# Patient Record
Sex: Female | Born: 1955 | Race: Black or African American | Hispanic: No | Smoking: Current some day smoker
Health system: Southern US, Community
[De-identification: ages and names within clinical notes are randomized; demographics above are authoritative.]

## PROBLEM LIST (undated history)

## (undated) DIAGNOSIS — T8859XA Other complications of anesthesia, initial encounter: Secondary | ICD-10-CM

## (undated) DIAGNOSIS — I10 Essential (primary) hypertension: Secondary | ICD-10-CM

## (undated) DIAGNOSIS — T4145XA Adverse effect of unspecified anesthetic, initial encounter: Secondary | ICD-10-CM

## (undated) DIAGNOSIS — K219 Gastro-esophageal reflux disease without esophagitis: Secondary | ICD-10-CM

## (undated) DIAGNOSIS — R0602 Shortness of breath: Secondary | ICD-10-CM

## (undated) DIAGNOSIS — M1990 Unspecified osteoarthritis, unspecified site: Secondary | ICD-10-CM

## (undated) DIAGNOSIS — I219 Acute myocardial infarction, unspecified: Secondary | ICD-10-CM

## (undated) DIAGNOSIS — J189 Pneumonia, unspecified organism: Secondary | ICD-10-CM

## (undated) DIAGNOSIS — R011 Cardiac murmur, unspecified: Secondary | ICD-10-CM

## (undated) DIAGNOSIS — I499 Cardiac arrhythmia, unspecified: Secondary | ICD-10-CM

## (undated) HISTORY — PX: REPLACEMENT TOTAL KNEE BILATERAL: SUR1225

## (undated) HISTORY — PX: SHOULDER ARTHROSCOPY W/ ROTATOR CUFF REPAIR: SHX2400

## (undated) HISTORY — PX: ABDOMINAL HYSTERECTOMY: SHX81

## (undated) HISTORY — PX: SKIN GRAFT: SHX250

## (undated) SURGERY — ARTHROPLASTY, HIP, TOTAL,POSTERIOR APPROACH
Anesthesia: General | Laterality: Left

---

## 1998-12-31 ENCOUNTER — Ambulatory Visit (HOSPITAL_COMMUNITY): Admission: RE | Admit: 1998-12-31 | Discharge: 1998-12-31 | Payer: Self-pay | Admitting: *Deleted

## 1999-02-24 ENCOUNTER — Inpatient Hospital Stay (HOSPITAL_COMMUNITY): Admission: EM | Admit: 1999-02-24 | Discharge: 1999-02-25 | Payer: Self-pay | Admitting: Emergency Medicine

## 1999-02-24 ENCOUNTER — Encounter: Payer: Self-pay | Admitting: Emergency Medicine

## 1999-08-23 ENCOUNTER — Emergency Department (HOSPITAL_COMMUNITY): Admission: EM | Admit: 1999-08-23 | Discharge: 1999-08-23 | Payer: Self-pay | Admitting: Emergency Medicine

## 1999-08-23 ENCOUNTER — Encounter: Payer: Self-pay | Admitting: Emergency Medicine

## 2000-02-25 ENCOUNTER — Emergency Department (HOSPITAL_COMMUNITY): Admission: EM | Admit: 2000-02-25 | Discharge: 2000-02-25 | Payer: Self-pay | Admitting: Emergency Medicine

## 2001-03-16 ENCOUNTER — Emergency Department (HOSPITAL_COMMUNITY): Admission: EM | Admit: 2001-03-16 | Discharge: 2001-03-16 | Payer: Self-pay

## 2001-10-17 ENCOUNTER — Emergency Department (HOSPITAL_COMMUNITY): Admission: EM | Admit: 2001-10-17 | Discharge: 2001-10-17 | Payer: Self-pay | Admitting: Emergency Medicine

## 2002-12-14 ENCOUNTER — Encounter: Payer: Self-pay | Admitting: Emergency Medicine

## 2002-12-14 ENCOUNTER — Emergency Department (HOSPITAL_COMMUNITY): Admission: EM | Admit: 2002-12-14 | Discharge: 2002-12-14 | Payer: Self-pay | Admitting: Emergency Medicine

## 2002-12-27 ENCOUNTER — Encounter: Payer: Self-pay | Admitting: Family Medicine

## 2002-12-27 ENCOUNTER — Encounter: Admission: RE | Admit: 2002-12-27 | Discharge: 2002-12-27 | Payer: Self-pay | Admitting: Family Medicine

## 2003-01-23 ENCOUNTER — Encounter: Admission: RE | Admit: 2003-01-23 | Discharge: 2003-04-23 | Payer: Self-pay | Admitting: Orthopedic Surgery

## 2003-02-25 ENCOUNTER — Ambulatory Visit (HOSPITAL_COMMUNITY): Admission: RE | Admit: 2003-02-25 | Discharge: 2003-02-25 | Payer: Self-pay | Admitting: Orthopedic Surgery

## 2003-02-25 ENCOUNTER — Encounter: Payer: Self-pay | Admitting: Orthopedic Surgery

## 2003-04-08 ENCOUNTER — Encounter: Payer: Self-pay | Admitting: Orthopedic Surgery

## 2003-04-09 ENCOUNTER — Ambulatory Visit (HOSPITAL_COMMUNITY): Admission: RE | Admit: 2003-04-09 | Discharge: 2003-04-10 | Payer: Self-pay | Admitting: Orthopedic Surgery

## 2003-04-30 ENCOUNTER — Encounter: Admission: RE | Admit: 2003-04-30 | Discharge: 2003-06-05 | Payer: Self-pay | Admitting: Orthopedic Surgery

## 2003-09-04 ENCOUNTER — Inpatient Hospital Stay (HOSPITAL_COMMUNITY): Admission: EM | Admit: 2003-09-04 | Discharge: 2003-09-11 | Payer: Self-pay | Admitting: Emergency Medicine

## 2003-09-17 ENCOUNTER — Encounter: Admission: RE | Admit: 2003-09-17 | Discharge: 2003-09-17 | Payer: Self-pay | Admitting: Family Medicine

## 2004-11-11 ENCOUNTER — Encounter (INDEPENDENT_AMBULATORY_CARE_PROVIDER_SITE_OTHER): Payer: Self-pay | Admitting: Specialist

## 2004-11-11 ENCOUNTER — Ambulatory Visit (HOSPITAL_COMMUNITY): Admission: RE | Admit: 2004-11-11 | Discharge: 2004-11-11 | Payer: Self-pay | Admitting: Orthopedic Surgery

## 2004-11-28 IMAGING — CR DG CHEST 2V
2 series · 2 of 2 positions shown · non-contrast
Comparison: none

CLINICAL DATA: Pneumonia.  
 TWO VIEW CHEST RADIOGRAPH   
 Comparing:   09/06/03.

[view not recorded (1 of 2)]
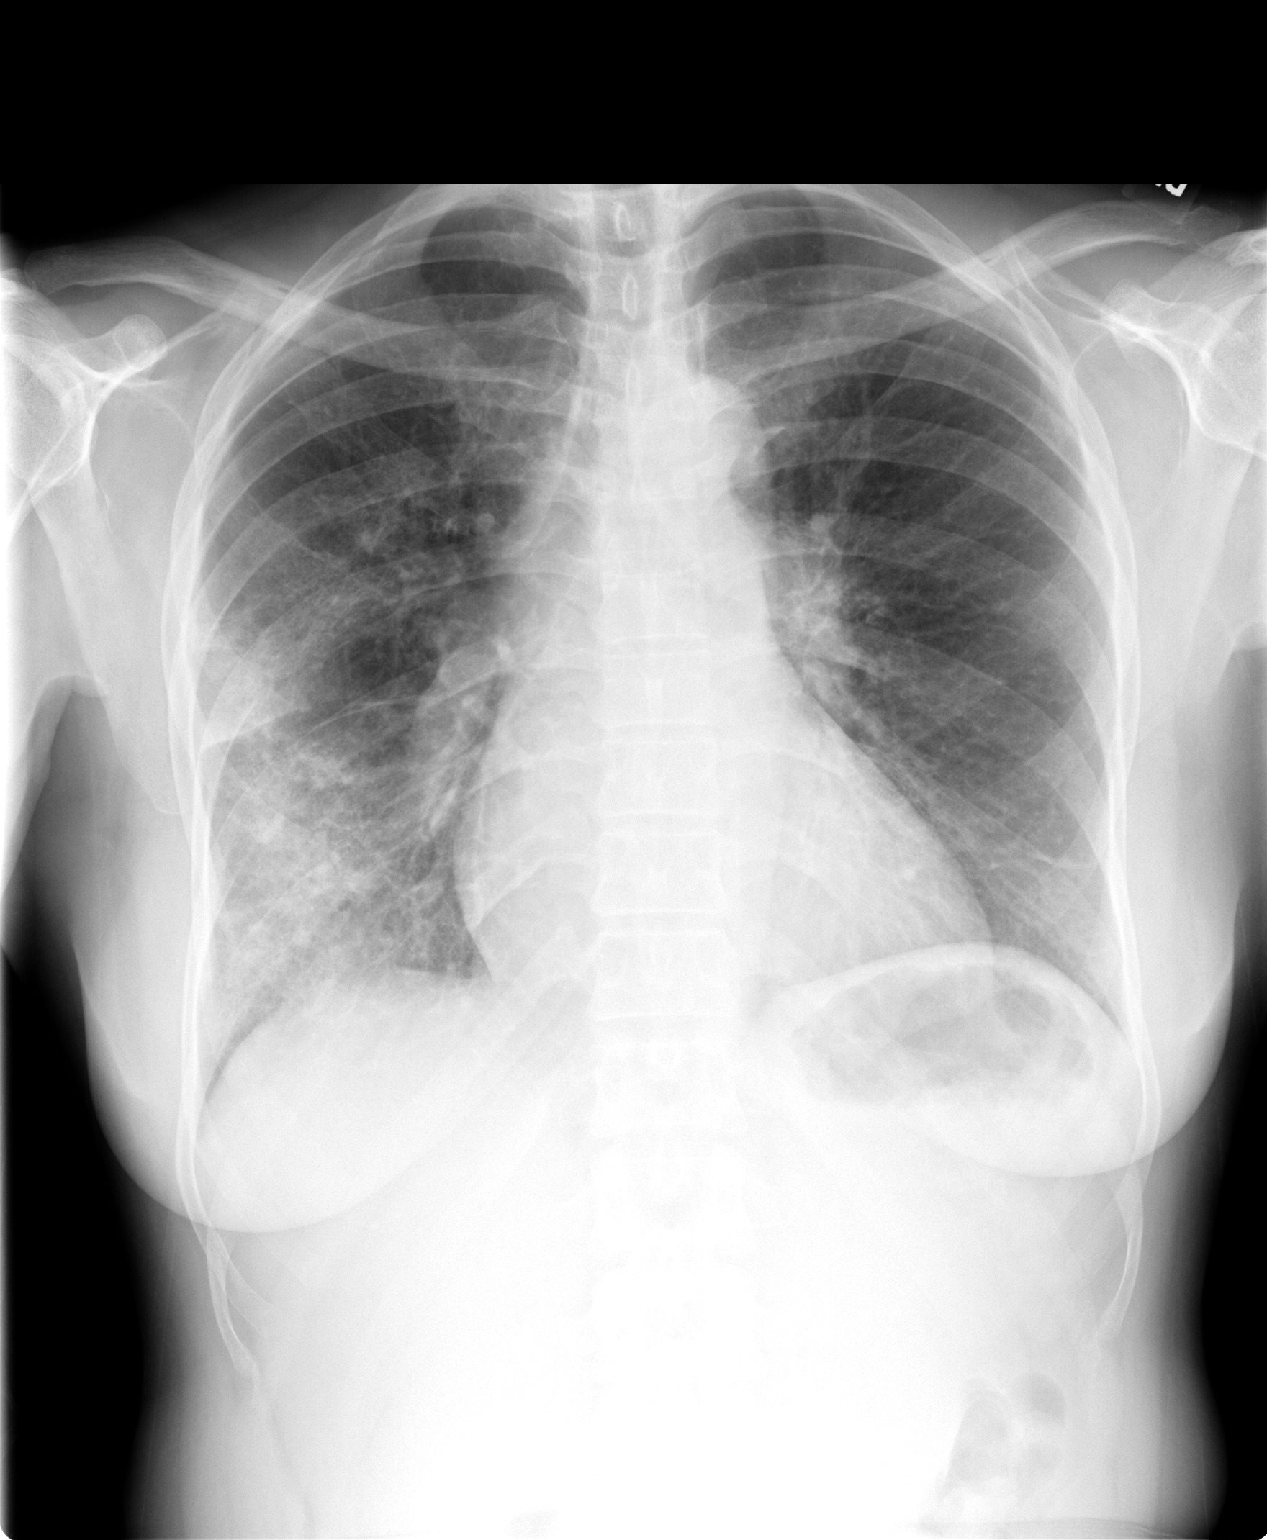

[view not recorded (2 of 2)]
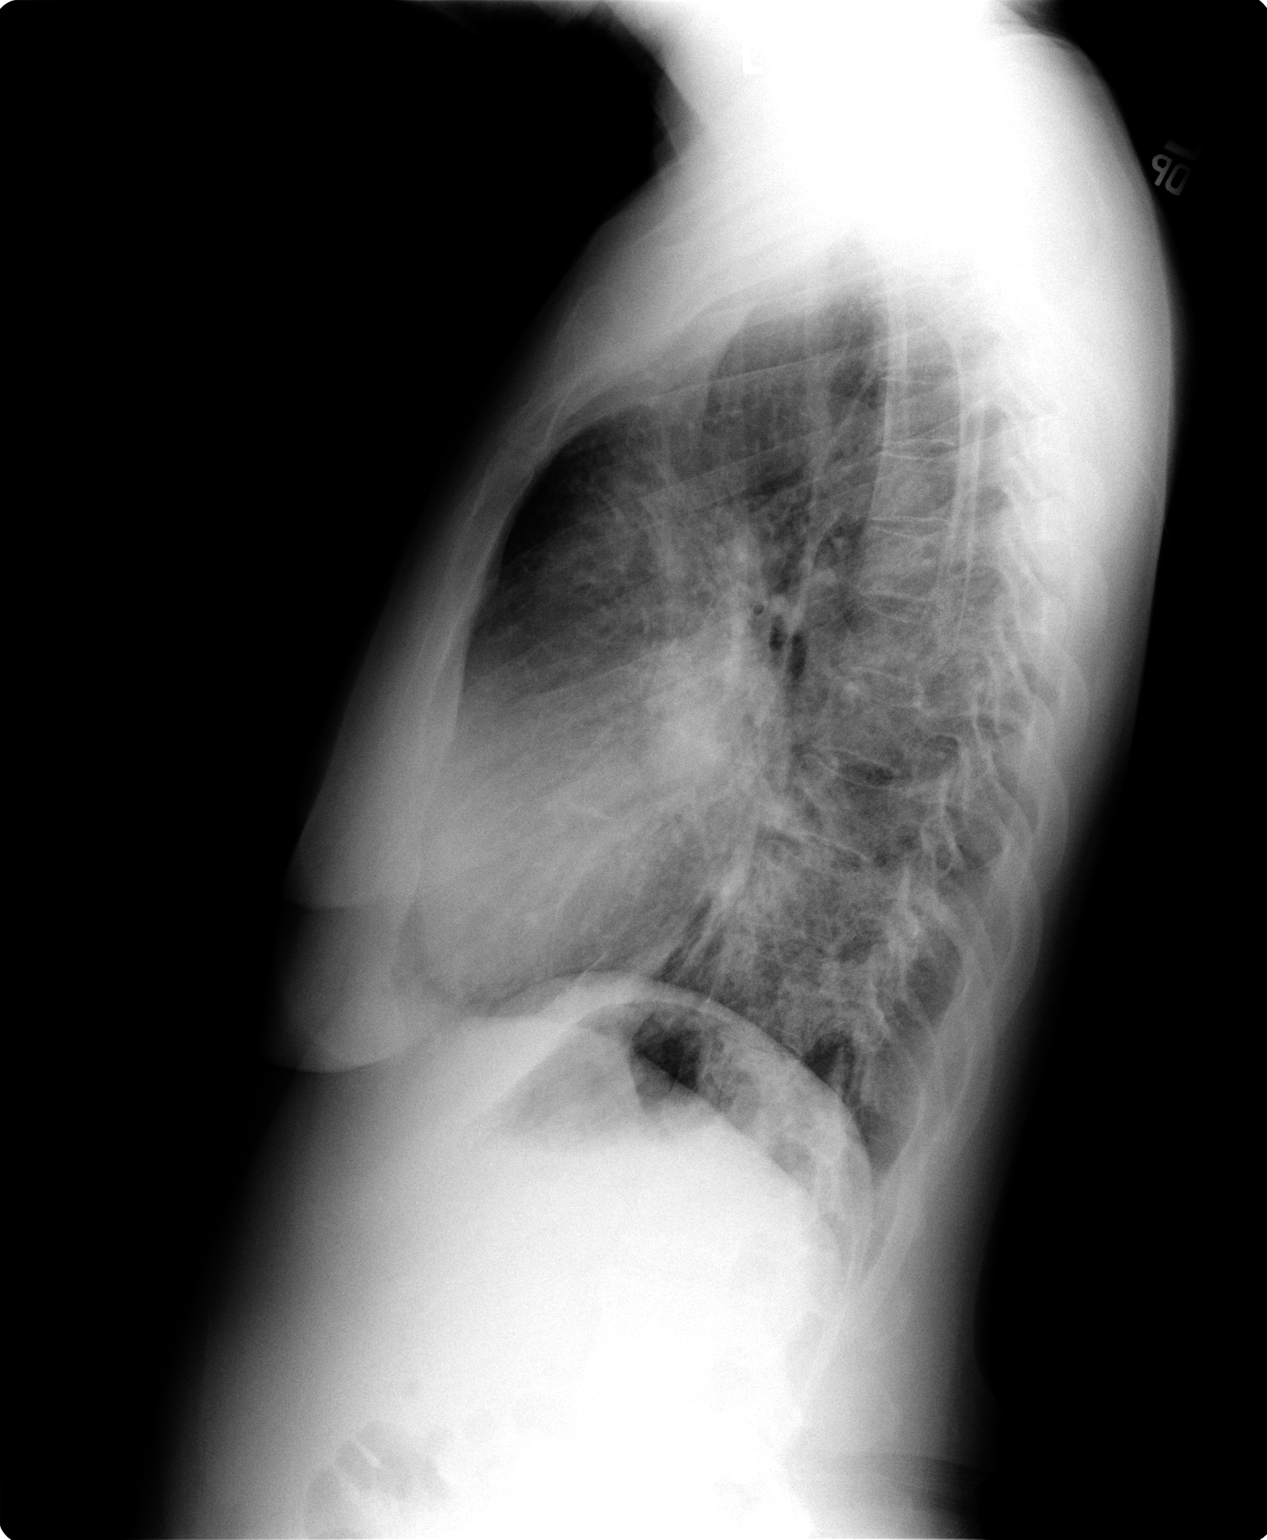

[2 of 2 positions shown; findings below may reference images not displayed]

FINDINGS: There is improved air space opacity bilaterally.  The appearance is less confluent in the right lung and there is reduced interstitial prominence in the left lung.  There is cardiomegaly and so this appearance could be due to asymmetric edema or pneumonia. 
 IMPRESSION
 1.  Improving interstitial and air space opacities.

## 2004-12-22 ENCOUNTER — Encounter: Admission: RE | Admit: 2004-12-22 | Discharge: 2005-03-02 | Payer: Self-pay | Admitting: Orthopedic Surgery

## 2005-03-02 ENCOUNTER — Encounter: Admission: RE | Admit: 2005-03-02 | Discharge: 2005-04-15 | Payer: Self-pay | Admitting: Orthopedic Surgery

## 2006-07-07 ENCOUNTER — Encounter: Admission: RE | Admit: 2006-07-07 | Discharge: 2006-07-07 | Payer: Self-pay | Admitting: Orthopedic Surgery

## 2006-07-20 ENCOUNTER — Encounter: Admission: RE | Admit: 2006-07-20 | Discharge: 2006-07-20 | Payer: Self-pay | Admitting: Orthopedic Surgery

## 2006-10-18 ENCOUNTER — Other Ambulatory Visit: Admission: RE | Admit: 2006-10-18 | Discharge: 2006-10-18 | Payer: Self-pay | Admitting: Family Medicine

## 2006-10-18 ENCOUNTER — Encounter: Admission: RE | Admit: 2006-10-18 | Discharge: 2006-10-18 | Payer: Self-pay | Admitting: Orthopedic Surgery

## 2006-10-25 ENCOUNTER — Encounter: Admission: RE | Admit: 2006-10-25 | Discharge: 2006-10-25 | Payer: Self-pay | Admitting: Family Medicine

## 2006-11-21 ENCOUNTER — Encounter: Admission: RE | Admit: 2006-11-21 | Discharge: 2006-11-21 | Payer: Self-pay | Admitting: Orthopedic Surgery

## 2007-01-09 ENCOUNTER — Encounter: Admission: RE | Admit: 2007-01-09 | Discharge: 2007-01-09 | Payer: Self-pay | Admitting: Orthopedic Surgery

## 2007-07-28 ENCOUNTER — Emergency Department (HOSPITAL_COMMUNITY): Admission: EM | Admit: 2007-07-28 | Discharge: 2007-07-28 | Payer: Self-pay | Admitting: Emergency Medicine

## 2007-08-31 ENCOUNTER — Inpatient Hospital Stay (HOSPITAL_COMMUNITY): Admission: RE | Admit: 2007-08-31 | Discharge: 2007-09-04 | Payer: Self-pay | Admitting: Orthopedic Surgery

## 2007-09-26 ENCOUNTER — Encounter: Admission: RE | Admit: 2007-09-26 | Discharge: 2007-12-25 | Payer: Self-pay | Admitting: Orthopedic Surgery

## 2008-06-13 ENCOUNTER — Encounter: Admission: RE | Admit: 2008-06-13 | Discharge: 2008-06-13 | Payer: Self-pay | Admitting: Orthopedic Surgery

## 2008-07-05 HISTORY — PX: TOTAL HIP ARTHROPLASTY: SHX124

## 2008-10-24 ENCOUNTER — Encounter: Admission: RE | Admit: 2008-10-24 | Discharge: 2008-10-24 | Payer: Self-pay | Admitting: Orthopedic Surgery

## 2009-02-27 ENCOUNTER — Encounter: Admission: RE | Admit: 2009-02-27 | Discharge: 2009-02-27 | Payer: Self-pay | Admitting: Anesthesiology

## 2009-04-24 ENCOUNTER — Encounter: Admission: RE | Admit: 2009-04-24 | Discharge: 2009-04-24 | Payer: Self-pay | Admitting: Orthopedic Surgery

## 2009-04-29 ENCOUNTER — Inpatient Hospital Stay (HOSPITAL_COMMUNITY): Admission: RE | Admit: 2009-04-29 | Discharge: 2009-05-01 | Payer: Self-pay | Admitting: Orthopedic Surgery

## 2009-06-05 ENCOUNTER — Encounter: Admission: RE | Admit: 2009-06-05 | Discharge: 2009-07-03 | Payer: Self-pay | Admitting: Orthopedic Surgery

## 2009-07-05 ENCOUNTER — Encounter: Admission: RE | Admit: 2009-07-05 | Discharge: 2009-08-21 | Payer: Self-pay | Admitting: Orthopedic Surgery

## 2009-08-06 ENCOUNTER — Emergency Department (HOSPITAL_COMMUNITY): Admission: EM | Admit: 2009-08-06 | Discharge: 2009-08-06 | Payer: Self-pay | Admitting: Emergency Medicine

## 2009-10-22 ENCOUNTER — Encounter: Admission: RE | Admit: 2009-10-22 | Discharge: 2009-10-22 | Payer: Self-pay | Admitting: Orthopedic Surgery

## 2010-01-13 ENCOUNTER — Encounter: Admission: RE | Admit: 2010-01-13 | Discharge: 2010-01-13 | Payer: Self-pay | Admitting: Orthopedic Surgery

## 2010-05-18 ENCOUNTER — Encounter: Admission: RE | Admit: 2010-05-18 | Discharge: 2010-05-18 | Payer: Self-pay | Admitting: Anesthesiology

## 2010-09-25 LAB — DIFFERENTIAL
Basophils Absolute: 0 10*3/uL (ref 0.0–0.1)
Eosinophils Absolute: 0.2 10*3/uL (ref 0.0–0.7)
Eosinophils Relative: 2 % (ref 0–5)

## 2010-09-25 LAB — COMPREHENSIVE METABOLIC PANEL
ALT: 13 U/L (ref 0–35)
AST: 21 U/L (ref 0–37)
Alkaline Phosphatase: 87 U/L (ref 39–117)
CO2: 23 mEq/L (ref 19–32)
Chloride: 107 mEq/L (ref 96–112)
GFR calc Af Amer: >60 mL/min (ref >60)
GFR calc non Af Amer: >60 mL/min (ref >60)
Potassium: 4.3 mEq/L (ref 3.5–5.1)
Sodium: 139 mEq/L (ref 135–145)
Total Bilirubin: 0.7 mg/dL (ref 0.3–1.2)

## 2010-09-25 LAB — CBC
MCV: 91.4 fL (ref 78.0–100.0)
RBC: 4.89 MIL/uL (ref 3.87–5.11)
WBC: 10.5 10*3/uL (ref 4.0–10.5)

## 2010-09-25 LAB — DIGOXIN LEVEL: Digoxin Level: <0.2 ng/mL — ABNORMAL LOW (ref 0.8–2.0)

## 2010-10-01 ENCOUNTER — Emergency Department (HOSPITAL_COMMUNITY)
Admission: EM | Admit: 2010-10-01 | Discharge: 2010-10-01 | Disposition: A | Discharge status: Home / Self Care | Payer: Medicaid Other | Attending: Emergency Medicine | Admitting: Emergency Medicine

## 2010-10-01 ENCOUNTER — Emergency Department (HOSPITAL_COMMUNITY): Payer: Medicaid Other

## 2010-10-01 ENCOUNTER — Encounter (HOSPITAL_COMMUNITY): Payer: Self-pay | Admitting: Radiology

## 2010-10-01 DIAGNOSIS — R10816 Epigastric abdominal tenderness: Secondary | ICD-10-CM | POA: Insufficient documentation

## 2010-10-01 DIAGNOSIS — Z79899 Other long term (current) drug therapy: Secondary | ICD-10-CM | POA: Insufficient documentation

## 2010-10-01 DIAGNOSIS — Z86711 Personal history of pulmonary embolism: Secondary | ICD-10-CM | POA: Insufficient documentation

## 2010-10-01 DIAGNOSIS — R059 Cough, unspecified: Secondary | ICD-10-CM | POA: Insufficient documentation

## 2010-10-01 DIAGNOSIS — I10 Essential (primary) hypertension: Secondary | ICD-10-CM | POA: Insufficient documentation

## 2010-10-01 DIAGNOSIS — M129 Arthropathy, unspecified: Secondary | ICD-10-CM | POA: Insufficient documentation

## 2010-10-01 DIAGNOSIS — D72829 Elevated white blood cell count, unspecified: Secondary | ICD-10-CM | POA: Insufficient documentation

## 2010-10-01 DIAGNOSIS — R1310 Dysphagia, unspecified: Secondary | ICD-10-CM | POA: Insufficient documentation

## 2010-10-01 DIAGNOSIS — R05 Cough: Secondary | ICD-10-CM | POA: Insufficient documentation

## 2010-10-01 DIAGNOSIS — R1013 Epigastric pain: Secondary | ICD-10-CM | POA: Insufficient documentation

## 2010-10-01 DIAGNOSIS — R079 Chest pain, unspecified: Secondary | ICD-10-CM | POA: Insufficient documentation

## 2010-10-01 HISTORY — DX: Essential (primary) hypertension: I10

## 2010-10-01 LAB — POCT CARDIAC MARKERS: Troponin i, poc: <0.05 ng/mL (ref 0.00–0.09)

## 2010-10-01 LAB — URINALYSIS, ROUTINE W REFLEX MICROSCOPIC
Bilirubin Urine: NEGATIVE
Glucose, UA: NEGATIVE mg/dL
Hgb urine dipstick: NEGATIVE
Ketones, ur: NEGATIVE mg/dL
Protein, ur: NEGATIVE mg/dL
Urobilinogen, UA: 1 mg/dL (ref 0.0–1.0)

## 2010-10-01 LAB — COMPREHENSIVE METABOLIC PANEL
ALT: 21 U/L (ref 0–35)
AST: 14 U/L (ref 0–37)
Albumin: 3.3 g/dL — ABNORMAL LOW (ref 3.5–5.2)
Alkaline Phosphatase: 69 U/L (ref 39–117)
BUN: 20 mg/dL (ref 6–23)
Chloride: 106 mEq/L (ref 96–112)
Potassium: 3.2 mEq/L — ABNORMAL LOW (ref 3.5–5.1)
Sodium: 140 mEq/L (ref 135–145)
Total Bilirubin: 0.6 mg/dL (ref 0.3–1.2)
Total Protein: 6.5 g/dL (ref 6.0–8.3)

## 2010-10-01 LAB — D-DIMER, QUANTITATIVE: D-Dimer, Quant: <0.22 ug/mL-FEU (ref 0.00–0.48)

## 2010-10-01 LAB — DIFFERENTIAL
Basophils Relative: 0 % (ref 0–1)
Lymphs Abs: 3.5 10*3/uL (ref 0.7–4.0)
Monocytes Relative: 6 % (ref 3–12)
Neutro Abs: 18.5 10*3/uL — ABNORMAL HIGH (ref 1.7–7.7)
Neutrophils Relative %: 79 % — ABNORMAL HIGH (ref 43–77)

## 2010-10-01 LAB — CBC
Hemoglobin: 13.9 g/dL (ref 12.0–15.0)
MCH: 31.5 pg (ref 26.0–34.0)
MCV: 86.8 fL (ref 78.0–100.0)
RBC: 4.41 MIL/uL (ref 3.87–5.11)
WBC: 23.4 10*3/uL — ABNORMAL HIGH (ref 4.0–10.5)

## 2010-10-01 MED ORDER — IOHEXOL 300 MG/ML  SOLN
100.0000 mL | Freq: Once | INTRAMUSCULAR | Status: AC | PRN
  Administered 2010-10-01: 100 mL via INTRAVENOUS

## 2010-10-08 LAB — URINALYSIS, ROUTINE W REFLEX MICROSCOPIC
Bilirubin Urine: NEGATIVE
Ketones, ur: 15 mg/dL — AB
Nitrite: NEGATIVE
Specific Gravity, Urine: 1.017 (ref 1.005–1.030)
Urobilinogen, UA: 1 mg/dL (ref 0.0–1.0)
pH: 5 (ref 5.0–8.0)

## 2010-10-08 LAB — COMPREHENSIVE METABOLIC PANEL
AST: 84 U/L — ABNORMAL HIGH (ref 0–37)
Albumin: 4.2 g/dL (ref 3.5–5.2)
BUN: 6 mg/dL (ref 6–23)
Calcium: 9.5 mg/dL (ref 8.4–10.5)
Chloride: 107 mEq/L (ref 96–112)
Creatinine, Ser: 0.78 mg/dL (ref 0.4–1.2)
GFR calc Af Amer: >60 mL/min (ref >60)
Total Bilirubin: 0.6 mg/dL (ref 0.3–1.2)

## 2010-10-08 LAB — PROTIME-INR
INR: 0.95 (ref 0.00–1.49)
Prothrombin Time: 22 seconds — ABNORMAL HIGH (ref 11.6–15.2)

## 2010-10-08 LAB — CROSSMATCH: Antibody Screen: NEGATIVE

## 2010-10-08 LAB — CBC
HCT: 33.1 % — ABNORMAL LOW (ref 36.0–46.0)
HCT: 40.2 % (ref 36.0–46.0)
Hemoglobin: 11.8 g/dL — ABNORMAL LOW (ref 12.0–15.0)
MCHC: 35.5 g/dL (ref 30.0–36.0)
MCHC: 35.8 g/dL (ref 30.0–36.0)
MCV: 92.7 fL (ref 78.0–100.0)
MCV: 93.3 fL (ref 78.0–100.0)
Platelets: 207 10*3/uL (ref 150–400)
Platelets: 211 10*3/uL (ref 150–400)
Platelets: 298 10*3/uL (ref 150–400)
RDW: 12.8 % (ref 11.5–15.5)
RDW: 12.9 % (ref 11.5–15.5)
WBC: 9.3 10*3/uL (ref 4.0–10.5)

## 2010-10-08 LAB — APTT: aPTT: 26 seconds (ref 24–37)

## 2010-10-16 ENCOUNTER — Other Ambulatory Visit (HOSPITAL_COMMUNITY): Payer: Self-pay | Admitting: Family Medicine

## 2010-10-16 DIAGNOSIS — R1310 Dysphagia, unspecified: Secondary | ICD-10-CM

## 2010-10-21 ENCOUNTER — Ambulatory Visit (HOSPITAL_COMMUNITY)
Admission: RE | Admit: 2010-10-21 | Discharge: 2010-10-21 | Disposition: A | Discharge status: Home / Self Care | Payer: Medicaid Other | Source: Ambulatory Visit | Attending: Family Medicine | Admitting: Family Medicine

## 2010-10-21 DIAGNOSIS — K224 Dyskinesia of esophagus: Secondary | ICD-10-CM | POA: Insufficient documentation

## 2010-10-21 DIAGNOSIS — K219 Gastro-esophageal reflux disease without esophagitis: Secondary | ICD-10-CM | POA: Insufficient documentation

## 2010-10-21 DIAGNOSIS — R1310 Dysphagia, unspecified: Secondary | ICD-10-CM | POA: Insufficient documentation

## 2010-11-17 NOTE — Discharge Summary (Signed)
NAMESHERRITA, [name] NO.:  [id number]   MEDICAL RECORD NO.:  [id number]          PATIENT TYPE:  INP   LOCATION:  2012                         FACILITY:  [facility]   PHYSICIAN:  Myrtie Neither, MD      DATE OF BIRTH:  1956/02/01   DATE OF ADMISSION:  08/31/2007  DATE OF DISCHARGE:  09/04/2007                               DISCHARGE SUMMARY   ADMITTING DIAGNOSIS:  Impingement syndrome rotator cuff, left shoulder.   DISCHARGE DIAGNOSES:  1. Impingement syndrome.  2. Partial tear rotator cuff, left shoulder.  3. Cardiac arrhythmia.   COMPLICATIONS:  Cardiac arrhythmia postoperative.   OPERATIONS:  1. Arthroscopic synovectomy.  2. Acromioplasty.  3. Decompression of left shoulder.   CARDIAC CONSULTATION:  With Dr. Donia Guiles for cardiac arrhythmia.   PERTINENT HISTORY:  This is a 55 year old female who had been following  the office for impingement and partial cuff tear involving the left  shoulder.  The patient had been treated with therapeutic injections and  anti-inflammatories, however, has progressive worsening of symptoms of  pain and reaching at above chest level and inability to rest on the left  side due to pain.  Pertinent physical was that of left shoulder tender  anterior and lateral with subacromial crepitus.  Pain increased on  abduction above 90 degrees and a resistive abduction and external  rotation.   HOSPITAL COURSE:  The patient underwent preop laboratory, CBC, EKG,  chest x-ray, CMET, and UA.  The patient's laboratory was stable to  undergo surgery.  The patient underwent arthroscopic acromioplasty and  decompression and synovectomy of the left shoulder.  She tolerated the  procedure quite well.  Postoperatively, the patient underwent cardiac  arrhythmia with rapid heart rate greater than 150 beats per minute.  The  patient was seen by Dr. Donia Guiles for an abnormal EKG.  The patient  was evaluated and placed on appropriate  medications and to be followed  back in the office with Dr. Shana Chute.  The patient was discharged in  stable and satisfactory condition.  Return to the office in 1 week.      Myrtie Neither, MD  Electronically Signed     AC/MEDQ  D:  10/18/2007  T:  10/19/2007  Job:  [id number]

## 2010-11-17 NOTE — Op Note (Signed)
NAME:  [name], [name] NO.:  [id number]   MEDICAL RECORD NO.:  [id number]          PATIENT TYPE:  INP   LOCATION:  3313                         FACILITY:  [facility]   PHYSICIAN:  Myrtie Neither, MD      DATE OF BIRTH:  [birth year]   DATE OF PROCEDURE:  08/31/2007  DATE OF DISCHARGE:                               OPERATIVE REPORT   PREOPERATIVE DIAGNOSIS:  Impingement syndrome, rotator cuff tear left  shoulder.   POSTOPERATIVE DIAGNOSIS:  Impingement syndrome, partial tear cuff left  shoulder.   ANESTHESIA:  General.   SURGEON:  Myrtie Neither, MD   PROCEDURE:  Arthroscopic synovectomy and acromioplasty and decompression  left shoulder.   PROCEDURE IN DETAIL:  The patient was taken to the operating room after  given adequate preop medication general anesthesia was intubated.  The  patient was placed in barber chair position.  Left shoulder was prepped  with DuraPrep and draped in a sterile manner.  A 1/2 inch puncture wound  was made posteriorly.  Swisher wire was placed from posterior to  anteriorly.  Anterior inflow incision was made.  Lateral separate  incision was made for the shaver.  Inspection of the joint itself  revealed hypertrophic growth of the subacromial bursal sac, anterior  tilting of the acromion.  There was rotator cuff tendopathy but without  full tear of the cuff itself.  With synovial shaver a complete  synovectomy was done followed by acromioplasty and decompression with  release of the coracohumeral ligament.  After adequate decompression  further inspection of the cuff did not reveal any full tear.  Wound  closure was then done with 4-0 nylon.  Compressive dressing was applied.  The patient was placed in a sling.  The patient tolerated the procedure  quite well and was taken to the recovery room in stable and satisfactory  condition.  The patient will be kept for 23 hour observation for pain  control.  The patient will be discharged on  Percocet one to two q.4  p.r.n. for pain, ice packs, continue use of sling and return to the  office in 1 week.  The patient is being discharged in stable and  satisfactory condition.      Myrtie Neither, MD  Electronically Signed     AC/MEDQ  D:  08/31/2007  T:  09/01/2007  Job:  [id number]

## 2010-11-20 NOTE — H&P (Signed)
NAMEAVY, BARLETT NO.:  [id number]   MEDICAL RECORD NO.:  [id number]                   PATIENT TYPE:  INP   LOCATION:  5022                                 FACILITY:  [facility]   PHYSICIAN:  Burnice Logan, M.D.               DATE OF BIRTH:  1956/01/07   DATE OF ADMISSION:  09/04/2003  DATE OF DISCHARGE:                                HISTORY & PHYSICAL   The patient is unassigned.   CHIEF COMPLAINT:  Shortness of breath and pain in right ribs for two weeks.   HISTORY OF PRESENT ILLNESS:  The patient is a 55 year old African-American  lady who presented to the emergency room with the above complaints. She has  had chills at home but no documented fever. She complains of pleuritic type  chest pain, especially in the right ribs, and has had cough productive of  yellowish sputum. She denies any hemoptysis. She has had some nausea and  occasional vomiting after meals. She has been taking over-the-counter  medications including Theraflu but has felt no better and decided to come to  the emergency room to be seen. While here in the emergency room, she was  started on IV fluids and after blood cultures and sputum cultures was given  a gram of Rocephin as well as Zithromax. She continues to have a lot of pain  and discomfort with breathing and coughing.   PAST MEDICAL HISTORY:  She denies any history of hypertension, diabetes,  hyperlipidemia. She has degenerative arthritis and has had surgery on her  left knee in October 2004. 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 She had hysterectomy 14 years ago. She has been  told she has a heart murmur.   ALLERGIES:  PENICILLIN and CODEINE.   MEDICATIONS:  1. Etodolac 400 mg b.i.d.  2. Darvocet-N 100.  3. NyQuil.  4. Theraflu.  5. Other over-the-counter medications.   FAMILY HISTORY:  The patient's mother died from lung cancer. Her grandfather  had heart failure.   SOCIAL  HISTORY:  The patient is disabled. She is separated. She had two  kids, one of whom is deceased. She smokes half a pack of cigarettes a day  and denies use of street drugs. She drinks alcohol on occasion.   REVIEW OF SYSTEMS:  GENERAL:  Malaise with chills as stated above.  NEUROLOGICAL:  Denies any headaches. CARDIORESPIRATORY:  Pleuritic chest  pain, productive cough. GASTROINTESTINAL:  No abdominal pain, nausea, or  vomiting. GENITOURINARY:  No dysuria, hematuria. MUSCULOSKELETAL:  Chronic  back pain. Other complaints include weight loss recently, from anorexia and  poor oral intake. She said her boyfriend has commented on her weight loss.   PHYSICAL EXAMINATION:  GENERAL:  Middle-aged African-American lady of  average build, appears uncomfortable because of her pleuritic pain.  VITAL SIGNS:  Temperature 98.6, blood pressure 103/69, heart rate 109,  respiratory rate  24 to 26 per minute, saturations on room air 98%.  HEENT:  She is normocephalic, atraumatic. No facial asymmetry. Normal  eyelids, pupils, and conjunctivae. Oral mucosa moist. No oral Candida seen.  NECK:  Supple. No distended veins. No thyromegaly.  LUNGS:  Splinting of the right side, bronchial breath sounds with  crepitation of the right base.  CARDIOVASCULAR:  Heart sounds 1 and 2 heard, regular. No murmurs. No rubs or  gallops.  ABDOMEN:  Soft and nontender. No hepatosplenomegaly. Few bowel sounds are  heard.  CENTRAL NERVOUS SYSTEM:  Alert and oriented x3. Cranial nerves II-XII  grossly normal. Moves all limbs well. Has no focal deficits.  EXTREMITIES:  No pedal edema. No skin rashes seen.   LABORATORY DATA:  WBC count 16,000 with 92% neutrophils and greater than 20%  bands. Hemoglobin 13, hematocrit 38, platelet count 364. Sodium 132,  potassium 3.3, glucose 91, BUN 20, creatinine 1.1, CO2 21. Chest x-ray shows  right middle lobe and right lower lobe infiltrate.   ASSESSMENT/PLAN:  1. Pneumonia that is  community acquired. The patient has significant     pleuritic chest pain with elevated white count and left sided shift. She     will be admitted for continued treatment with IV antibiotics. Will follow     her cultures for possible bacteriological diagnosis and tailor     antibiotics towards treating the cause. She was advised to quit smoking.     Will use oxygen on an as-needed basis and treat her with antipyretics if     needed.  2. Pleuritic chest pain. This will be treated with narcotic analgesics. Will     expect her pain to improve as she recovers from pneumonia with antibiotic     treatment.  3. Mild hypokalemia and hyponatremia. This has been addressed with IV fluids     and potassium supplementation.                                                Burnice Logan, M.D.    ES/MEDQ  D:  09/04/2003  T:  09/05/2003  Job:  [id number]

## 2010-11-20 NOTE — Op Note (Signed)
[name], SENG NO.:  [id number]   MEDICAL RECORD NO.:  [id number]          PATIENT TYPE:  OIB   LOCATION:  2899                         FACILITY:  [facility]   PHYSICIAN:  Myrtie Neither, MD      DATE OF BIRTH:  [birth year]   DATE OF PROCEDURE:  11/11/2004  DATE OF DISCHARGE:                                 OPERATIVE REPORT   PREOPERATIVE DIAGNOSES:  1.  Internal derangement right knee.  2.  Cyst, left knee.   ANESTHESIA:  General.   PROCEDURE:  1.  Arthroscopic chondroplasty medial femoral condyle, right knee. Excision      of plica and synovectomy medial and lateral compartments, right knee.  2.  Excision of cyst, popliteal fossa left knee.   The patient was taken to the operating room after given adequate  preoperative medication and given general anesthesia and intubated. The  right knee was prepped with DuraPrep and draped in a sterile manner, a  tourniquet used for hemostasis. One-half-inch puncture wound made along the  anterior medial and lateral joint laterally and __________ through the  medial suprapatellar pouch area. Inspection of the joint revealed chondral  defect approximately the size of a twenty-five-cent piece over the medial  femoral condyle with loose fragments, hypertrophic fibrotic plica with  thickened synovium both medial and lateral compartment. With a meniscal  shaver, a chondroplasty was done over the medial femoral condyle. Synovial  shaver synovectomy was done both medial and lateral compartment with  excision of the plica. Copious and abundant irrigation was done. ACL and PCL  were intact. The lateral compartments well preserved. Medial and lateral  menisci were intact. Wound closure was then done with 4-0 nylon and 12 mL of  0.25% Marcaine plain was injected into the knee.   Next, the left knee was prepped and draped with DuraPrep. A cystic lesion  along the lateral aspect of the popliteal fossa of the left knee was then  resected. This was down to the subcutaneous tissue. Hemostasis obtained with  the Bovie. Wound closure was then done with 4-0 nylon. Compressive dressing  was applied. The patient tolerated the procedure quite well and went to the  recovery room in stable and satisfactory condition.      AC/MEDQ  D:  11/11/2004  T:  11/11/2004  Job:  [id number]

## 2010-11-20 NOTE — H&P (Signed)
[name], DREESE NO.:  [id number]   MEDICAL RECORD NO.:  [id number]          PATIENT TYPE:  OIB   LOCATION:  2899                         FACILITY:  [facility]   PHYSICIAN:  Myrtie Neither, MD      DATE OF BIRTH:  June 17, 1956   DATE OF ADMISSION:  11/11/2004  DATE OF DISCHARGE:                                HISTORY & PHYSICAL   CHIEF COMPLAINT:  1.  Painful right knee.  2.  Growth over the back of the left knee.   HISTORY OF PRESENT ILLNESS:  This is a 55 year old female followed for  internal derangement of the right knee over the past few months with pain,  swelling, catching, progressive dysfunction due to pain on weightbearing.  Also, the patient complained of growth over the back of her left knee which  has gotten larger and rubs up against her clothing, as well as gets caught  in clothing.   PAST MEDICAL HISTORY:  That of left knee arthroscopy in the past,  hysterectomy, skin graft from right thigh to axilla in 1986, history of  heart murmur, history of bilateral pneumonia.   ALLERGIES:  1.  CODEINE.  2.  PENICILLIN.   MEDICATIONS:  Benadryl, etodolac, cilostazol, Darvocet-N 100, Skelaxin.   SOCIAL HISTORY:  The patient does drink alcohol occasionally and one to two  packs per day smoking. No history of use of illegal drugs.   REVIEW OF SYSTEMS:  Some chronic recurrent cough, nonproductive. Some  shortness of breath on exertion but no chest pain. No urinary or bowel  symptoms.   FAMILY HISTORY:  Noncontributory.   PHYSICAL EXAMINATION:  VITAL SIGNS:  Temperature 96.9, pulse 70,  respirations 20, blood pressure 153/98. Height 67 inches, weight 141.  HEENT:  Head:  Normocephalic. Eyes:  Conjunctivae and sclerae clear.  NECK:  Supple.  CHEST:  Clear.  CARDIAC:  S1, S2, regular.  EXTREMITIES:  Right knee positive McMurray's test, +2 effusion, tender  medial compartment palpable with audible click medially. Negative drawers,  negative Lachman's  test. Left knee:  Large 1-inch diameter cystic lesion  skin tag over the posterolateral aspect of the left knee. Nonpulsatile.   IMPRESSION:  1.  Internal derangement right knee.  2.  Cystic skin tag left knee.   PLAN:  Arthroscopy of the right knee and excision of skin cyst of the left  knee.      AC/MEDQ  D:  11/11/2004  T:  11/11/2004  Job:  [id number]

## 2010-11-20 NOTE — Op Note (Signed)
   NAMEAJANE, NOVELLA NO.:  [id number]   MEDICAL RECORD NO.:  [id number]                   PATIENT TYPE:  OIB   LOCATION:  2899                                 FACILITY:  [facility]   PHYSICIAN:  Myrtie Neither, M.D.                 DATE OF BIRTH:  11-Feb-1956   DATE OF PROCEDURE:  04/09/2003  DATE OF DISCHARGE:                                 OPERATIVE REPORT   PREOPERATIVE DIAGNOSES:  1. Internal derangement of left knee.  2. Condylar defect, medial femoral condyle.   POSTOPERATIVE DIAGNOSES:  1. Condylar defect, medial femoral condyle.  2. Synovial cyst, medial compartment.   PROCEDURE:  1. Arthroscopic excision of synovial cyst.  2. Chondroplasty, medial femoral condyle.   SURGEON:  Myrtie Neither, M.D.   ANESTHESIA:  General.   DESCRIPTION OF PROCEDURE:  The patient was taken to the operating room after  giving adequate preoperative medication and given general anesthesia and  intubated.  The left knee was prepped with Duraprep and draped in a sterile  manner.  A tourniquet was used for hemostasis.  A 1/2-inch puncture wound  was made along anteromedial and lateral joint line for inflow of water  through the medial suprapatellar area.  Inspection of the joint revealed a  large silver-dollar-sized condylar defect involving the medial femoral  condyle with irregular edges.  There was a large cystic mass pressed up  against the wall as well as into the joint itself.  ACL was intact.  Lateral  compartment was well-preserved.  With synovial shaver, the synovial cystic  lesion was resected.  Next, chondroplasty was then done over the medial  femoral condyle with removal of the loose bodies.  This was then smoothened  down with the arthroscopic rasp, with roughening up of the surface.  Copious  irrigation was then done.  Wound closure was then done with 4-0 nylon.  Thirteen milliliters of 0.25% Marcaine with epinephrine were injected.  Compressive  dressing was applied and knee immobilizer applied.  Patient  tolerated the procedure quite well and went to the recovery room in stable  and satisfactory condition.   The patient is being discharged on Percocet 5 mg q.4 h. p.r.n. for pain, ice  packs, nonweightbearing on the right side with the use of crutches and to  return to the office in a 10-day period.  The patient is being discharged in  stable and satisfactory condition.                                               Myrtie Neither, M.D.    AC/MEDQ  D:  04/09/2003  T:  04/09/2003  Job:  [id number]

## 2010-11-20 NOTE — Discharge Summary (Signed)
[name], [name] NO.:  [id number]   MEDICAL RECORD NO.:  [id number]                   PATIENT TYPE:  INP   LOCATION:  5022                                 FACILITY:  [facility]   PHYSICIAN:  Elliot Cousin, M.D.                 DATE OF BIRTH:  12/13/1955   DATE OF ADMISSION:  09/04/2003  DATE OF DISCHARGE:  09/11/2003                                 DISCHARGE SUMMARY   DISCHARGE DIAGNOSES:  1. Bilateral pneumonia.  2. Streptococcus pneumoniae bacteremia.  3. Diarrhea.   SECONDARY DIAGNOSIS:  1. Osteoarthritis.  2. Status post left knee arthroscopic surgery in October of 2004.  3. History of low back surgery in the past.  4. Status post hysterectomy 14 years ago.   DISCHARGE MEDICATIONS:  1. Avelox 400 mg daily for an additional 10 days.  2. Percocet 2.5 mg every six hours as needed for pain.  3. Tessalon Perles 100 mg b.i.d.   DISPOSITION:  The patient was discharged to home in improved and stable  condition on September 11, 2003.  She has a follow-up appointment with her  primary care physician, Dr. Bruna Potter, on Monday March 14.   HISTORY OF PRESENT ILLNESS:  The patient is a 55 year old African-American  lady with a history of osteoarthritis who presented to the emergency  department with shortness of breath and pain in her right ribs for two  weeks.  The patient also had some accompanying chills, but no fever.  She  complained of pleuritic chest pain on the right greater than the left. She  had a productive cough with yellow sputum. She denied any hemoptysis.  She  did have some nausea and occasional vomiting after a couple of meals.  She  had been taking over-the-counter medications with no improvement.  While in  the emergency department, she was given IV fluids.  Blood cultures and  sputum cultures were ordered prior to 1 gram of Rocephin and 500 mg of  Zithromax were given.  The chest x-ray in the emergency department revealed  a right upper  lobe and a right lower lobe infiltrate.  The patient was  therefore admitted for further evaluation and management.   HOSPITAL COURSE:  Problem 1.  Bilateral pneumonia with Streptococcus  pneumoniae bacteremia.  The initial management started in the emergency  department when the patient was given 1 gram of Rocephin and 500 mg of  Azithromycin IV x1 each.  She was also given IV fluids with normal saline.  Blood cultures and sputum cultures were ordered from the emergency  department.  She was afebrile at the time of assessment by Burnice Logan,  M.D.  However, the following day, the patient became febrile.  Her  temperature went up to 101 and also increased the following day to 102.9.  Nasal cannula oxygen was also administered to the patient although she was  oxygenating 97% on room air.  The patient remained on Rocephin and  Azithromycin for two hospital days, however, with the persistent fevers, the  decision was made to change the antibiotic therapy to Avelox 400 mg IV  daily.  Therefore, the Rocephin and the Azithromycin were discontinued.  The  patient did have mild to moderate pleuritic right-sided chest pain.  The  pain was treated with morphine as needed.  The patient's cough was treated  symptomatically with Codeine.  However, the patient experienced an allergic  pruritus secondary to the Codeine, therefore the Codeine was discontinued.  The patient was therefore treated with Robitussin DM and Tessalon Perles.  The allergic rash was mild, however, it was treated with Elocon topical  ointment for three days.  The rash resolved.  After 48 hours of treatment  with Avelox, the patient's fevers resolved.  She began to have less and less  pleuritic chest pain.  She began to ambulate with less shortness of breath.  The morphine was discontinued and the patient was treated with as-needed  Percocet.  At the time of hospital discharge, the patient was much improved.   It is important to  note that the patient's blood cultures were positive for  Strep pneumoniae bacteremia.  The Strep Pneumo was sensitive to Avelox as  well as Rocephin.  A repeat chest x-ray revealed developing air space  disease in the left lung centrally.  However, the patient began to improve  symptomatically.  The repeat chest x-ray was performed on September 06, 2003.  The patient was discharged on September 11, 2003, in much improved condition.  She was advised to continue antibiotic therapy with Avelox 400 mg p.o. daily  for an additional 10 days.  She was advised to follow up with her primary  care physician in three to five days.   Problem 2.  Diarrhea.  The patient had frequent loose stools during the  hospital course.  Stool specimens were obtained x2 for testing and studies.  The stool specimens were negative x2 for C.difficile toxin.  The specimens  were also negative for any enteric bacterial infection.  The diarrhea may  have been secondary to the infection or the antibiotics.  Nevertheless, the  patient's diarrhea subsided and resolved before hospital discharge.                                                Elliot Cousin, M.D.    DF/MEDQ  D:  09/16/2003  T:  09/18/2003  Job:  [id number]   cc:   Dr. Bruna Potter

## 2010-11-20 NOTE — H&P (Signed)
   NAMEAIRIKA, [name] NO.:  [id number]   MEDICAL RECORD NO.:  [id number]                   PATIENT TYPE:  OIB   LOCATION:  2899                                 FACILITY:  [facility]   PHYSICIAN:  Myrtie Neither, M.D.                 DATE OF BIRTH:  Nov 15, 1955   DATE OF ADMISSION:  04/09/2003  DATE OF DISCHARGE:                                HISTORY & PHYSICAL   CHIEF COMPLAINT:  Painful left knee.   HISTORY OF PRESENT ILLNESS:  This is a 55 year old black female who has been  followed in the office for internal derangement involving her left knee with  pain and catching which she initiated approximately six to nine months ago  while getting out of a car.  The patient felt a catch in the left knee and  the pain has persisted ever since.  The patient has been treated with anti-  inflammatories.   PAST MEDICAL HISTORY:  No previous operations.  No history of high blood  pressure or diabetes.   ALLERGIES:  CODEINE, and PENICILLIN causes hives.   SOCIAL HISTORY:  The patient lives alone, has a history of substance abuse,  most recent use of marijuana and crack this past weekend, also alcohol use  of beer and smokes less than a pack of cigarettes per day.   REVIEW OF SYSTEMS:  Review of systems is basically that of history of  present illness.  No cardiac or respiratory and no urinary or bowel  symptoms.   FAMILY HISTORY:  Noncontributory.   PHYSICAL EXAMINATION:  VITAL SIGNS:  Temperature 97.8, pulse 95,  respirations 18 and blood pressure 148/85.  Height 66-1/4 inches, weight  129.  HEENT:  Normocephalic.  Eyes:  Sclerae and conjunctivae are clear.  NECK:  Neck supple.  CHEST:  Chest clear.  CARDIAC:  S1 and S2 regular.  NECK:  Neck supple.  ABDOMEN:  Abdomen soft with active bowel sounds.  EXTREMITIES:  Left knee:  Palpable click over the medial compartment, tender  anteromedially and anterior aspect of the joint.  Range of motion is full.  Negative Lachman's.  Negative pivot shift.  Quad weakness.   IMAGING STUDIES:  MRI demonstrated condylar defect, medial femoral condyle.    IMPRESSION:  1. Internal derangement of left knee.  2. Condylar defect, medial femoral condyle, left knee.   PLAN:  Arthroscopy of the left knee.                                                Myrtie Neither, M.D.    AC/MEDQ  D:  04/09/2003  T:  04/09/2003  Job:  [id number]

## 2011-01-21 ENCOUNTER — Other Ambulatory Visit: Payer: Self-pay | Admitting: Orthopedic Surgery

## 2011-01-21 ENCOUNTER — Ambulatory Visit
Admission: RE | Admit: 2011-01-21 | Discharge: 2011-01-21 | Disposition: A | Discharge status: Home / Self Care | Payer: Medicaid Other | Source: Ambulatory Visit | Attending: Orthopedic Surgery | Admitting: Orthopedic Surgery

## 2011-01-21 DIAGNOSIS — M5430 Sciatica, unspecified side: Secondary | ICD-10-CM

## 2011-01-21 DIAGNOSIS — M25552 Pain in left hip: Secondary | ICD-10-CM

## 2011-01-27 ENCOUNTER — Other Ambulatory Visit (HOSPITAL_COMMUNITY): Payer: Self-pay | Admitting: Orthopedic Surgery

## 2011-01-27 DIAGNOSIS — M25552 Pain in left hip: Secondary | ICD-10-CM

## 2011-02-04 ENCOUNTER — Encounter (HOSPITAL_COMMUNITY)
Admission: RE | Admit: 2011-02-04 | Discharge: 2011-02-04 | Disposition: A | Discharge status: Home / Self Care | Payer: Medicaid Other | Source: Ambulatory Visit | Attending: Orthopedic Surgery | Admitting: Orthopedic Surgery

## 2011-02-04 DIAGNOSIS — Z96649 Presence of unspecified artificial hip joint: Secondary | ICD-10-CM | POA: Insufficient documentation

## 2011-02-04 DIAGNOSIS — M25559 Pain in unspecified hip: Secondary | ICD-10-CM | POA: Insufficient documentation

## 2011-02-04 DIAGNOSIS — M25552 Pain in left hip: Secondary | ICD-10-CM

## 2011-02-04 MED ORDER — TECHNETIUM TC 99M MEDRONATE IV KIT
25.0000 | PACK | Freq: Once | INTRAVENOUS | Status: AC | PRN
Start: 2011-02-04 — End: 2011-02-04
  Administered 2011-02-04: 26.7 via INTRAVENOUS

## 2011-02-04 MED ORDER — TECHNETIUM TC 99M MEDRONATE IV KIT: 25.0000 | PACK | Freq: Once | INTRAVENOUS | Status: DC | PRN

## 2011-02-22 ENCOUNTER — Ambulatory Visit
Admission: RE | Admit: 2011-02-22 | Discharge: 2011-02-22 | Disposition: A | Discharge status: Home / Self Care | Payer: Medicaid Other | Source: Ambulatory Visit | Attending: Orthopedic Surgery | Admitting: Orthopedic Surgery

## 2011-02-22 ENCOUNTER — Other Ambulatory Visit: Payer: Self-pay | Admitting: Orthopedic Surgery

## 2011-02-22 DIAGNOSIS — M5430 Sciatica, unspecified side: Secondary | ICD-10-CM

## 2011-03-29 LAB — BASIC METABOLIC PANEL
BUN: 7
Calcium: 9.6
Calcium: 9.3
Chloride: 108
GFR calc Af Amer: >60
GFR calc Af Amer: >60
GFR calc Af Amer: >60
GFR calc non Af Amer: 58 — ABNORMAL LOW
GFR calc non Af Amer: >60
GFR calc non Af Amer: >60
Glucose, Bld: 147 — ABNORMAL HIGH
Potassium: 3.3 — ABNORMAL LOW
Potassium: 3.6
Sodium: 140
Sodium: 136
Sodium: 137

## 2011-03-29 LAB — URINALYSIS, ROUTINE W REFLEX MICROSCOPIC
Glucose, UA: NEGATIVE
Ketones, ur: NEGATIVE
Nitrite: NEGATIVE
Protein, ur: NEGATIVE
Specific Gravity, Urine: 1.016
pH: 5

## 2011-03-29 LAB — CBC
Hemoglobin: 14.2
RBC: 4.5
RDW: 13.9

## 2011-03-29 LAB — APTT: aPTT: 25

## 2011-03-29 LAB — PROTIME-INR
INR: 0.9
Prothrombin Time: 11.8

## 2011-03-29 LAB — HEPATIC FUNCTION PANEL
AST: 27
Albumin: 3.6
Bilirubin, Direct: 0.2

## 2011-03-29 LAB — CK TOTAL AND CKMB (NOT AT ARMC): CK, MB: 1.6

## 2011-03-29 LAB — T4: T4, Total: 8.6

## 2011-03-29 LAB — TROPONIN I: Troponin I: 0.01

## 2011-04-25 ENCOUNTER — Emergency Department (HOSPITAL_COMMUNITY)
Admission: EM | Admit: 2011-04-25 | Discharge: 2011-04-25 | Disposition: A | Discharge status: Home / Self Care | Payer: Medicaid Other | Attending: Emergency Medicine | Admitting: Emergency Medicine

## 2011-04-25 ENCOUNTER — Emergency Department (HOSPITAL_COMMUNITY): Payer: Medicaid Other

## 2011-04-25 DIAGNOSIS — I10 Essential (primary) hypertension: Secondary | ICD-10-CM | POA: Insufficient documentation

## 2011-04-25 DIAGNOSIS — L293 Anogenital pruritus, unspecified: Secondary | ICD-10-CM | POA: Insufficient documentation

## 2011-04-25 DIAGNOSIS — N898 Other specified noninflammatory disorders of vagina: Secondary | ICD-10-CM | POA: Insufficient documentation

## 2011-04-25 DIAGNOSIS — M129 Arthropathy, unspecified: Secondary | ICD-10-CM | POA: Insufficient documentation

## 2011-04-25 DIAGNOSIS — IMO0002 Reserved for concepts with insufficient information to code with codable children: Secondary | ICD-10-CM | POA: Insufficient documentation

## 2011-04-25 DIAGNOSIS — Z86718 Personal history of other venous thrombosis and embolism: Secondary | ICD-10-CM | POA: Insufficient documentation

## 2011-04-25 DIAGNOSIS — B3731 Acute candidiasis of vulva and vagina: Secondary | ICD-10-CM | POA: Insufficient documentation

## 2011-04-25 DIAGNOSIS — Z79899 Other long term (current) drug therapy: Secondary | ICD-10-CM | POA: Insufficient documentation

## 2011-04-25 DIAGNOSIS — N949 Unspecified condition associated with female genital organs and menstrual cycle: Secondary | ICD-10-CM | POA: Insufficient documentation

## 2011-04-25 DIAGNOSIS — R1032 Left lower quadrant pain: Secondary | ICD-10-CM | POA: Insufficient documentation

## 2011-04-25 DIAGNOSIS — B373 Candidiasis of vulva and vagina: Secondary | ICD-10-CM | POA: Insufficient documentation

## 2011-04-25 LAB — URINALYSIS, ROUTINE W REFLEX MICROSCOPIC
Glucose, UA: >1000 mg/dL — AB
Hgb urine dipstick: NEGATIVE
Protein, ur: NEGATIVE mg/dL

## 2011-04-25 LAB — POCT I-STAT, CHEM 8
Calcium, Ion: 1.29 mmol/L (ref 1.12–1.32)
Glucose, Bld: 437 mg/dL — ABNORMAL HIGH (ref 70–99)
HCT: 46 % (ref 36.0–46.0)
Hemoglobin: 15.6 g/dL — ABNORMAL HIGH (ref 12.0–15.0)
Potassium: 3.6 mEq/L (ref 3.5–5.1)

## 2011-04-25 LAB — URINE MICROSCOPIC-ADD ON

## 2011-04-25 LAB — WET PREP, GENITAL: Trich, Wet Prep: NONE SEEN

## 2011-04-25 MED ORDER — IOHEXOL 300 MG/ML  SOLN
100.0000 mL | Freq: Once | INTRAMUSCULAR | Status: AC | PRN
  Administered 2011-04-25: 100 mL via INTRAVENOUS

## 2011-09-08 ENCOUNTER — Ambulatory Visit
Admission: RE | Admit: 2011-09-08 | Discharge: 2011-09-08 | Disposition: A | Discharge status: Home / Self Care | Payer: Medicaid Other | Source: Ambulatory Visit | Attending: Orthopedic Surgery | Admitting: Orthopedic Surgery

## 2011-09-08 ENCOUNTER — Other Ambulatory Visit: Payer: Self-pay | Admitting: Orthopedic Surgery

## 2011-09-08 DIAGNOSIS — M25551 Pain in right hip: Secondary | ICD-10-CM

## 2011-11-25 ENCOUNTER — Encounter (HOSPITAL_COMMUNITY): Payer: Self-pay | Admitting: Pharmacy Technician

## 2011-11-30 ENCOUNTER — Other Ambulatory Visit: Payer: Self-pay | Admitting: Orthopedic Surgery

## 2011-12-01 ENCOUNTER — Inpatient Hospital Stay (HOSPITAL_COMMUNITY): Admission: RE | Admit: 2011-12-01 | Discharge: 2011-12-01 | Payer: Medicaid Other | Source: Ambulatory Visit

## 2011-12-01 ENCOUNTER — Encounter (HOSPITAL_COMMUNITY): Payer: Self-pay

## 2011-12-01 HISTORY — DX: Other complications of anesthesia, initial encounter: T88.59XA

## 2011-12-01 HISTORY — DX: Acute myocardial infarction, unspecified: I21.9

## 2011-12-01 HISTORY — DX: Cardiac murmur, unspecified: R01.1

## 2011-12-01 HISTORY — DX: Adverse effect of unspecified anesthetic, initial encounter: T41.45XA

## 2011-12-01 NOTE — Pre-Procedure Instructions (Signed)
20 [name]  12/01/2011   Your procedure is scheduled on:  June 6  Report to [facility] at 05:30 AM.  Call this number if you have problems the morning of surgery: [phone]   Remember:   Do not eat food:After Midnight.  May have clear liquids: up to 4 Hours before arrival.  Clear liquids include soda, tea, black coffee, apple or grape juice, broth.  Take these medicines the morning of surgery with A SIP OF WATER: Coreg, Digoxin, Oxycodone   Do not wear jewelry, make-up or nail polish.  Do not wear lotions, powders, or perfumes. You may wear deodorant.  Do not shave 48 hours prior to surgery. Men may shave face and neck.  Do not bring valuables to the hospital.  Contacts, dentures or bridgework may not be worn into surgery.  Leave suitcase in the car. After surgery it may be brought to your room.  For patients admitted to the hospital, checkout time is 11:00 AM the day of discharge.   Special Instructions: Incentive Spirometry - Practice and bring it with you on the day of surgery. and CHG Shower Use Special Wash: 1/2 bottle night before surgery and 1/2 bottle morning of surgery.   Please read over the following fact sheets that you were given: Pain Booklet, Coughing and Deep Breathing, Blood Transfusion Information, Total Joint Packet, MRSA Information and Surgical Site Infection Prevention

## 2011-12-07 ENCOUNTER — Ambulatory Visit (HOSPITAL_COMMUNITY)
Admission: RE | Admit: 2011-12-07 | Discharge: 2011-12-07 | Disposition: A | Discharge status: Home / Self Care | Payer: Medicaid Other | Source: Ambulatory Visit | Attending: Orthopedic Surgery | Admitting: Orthopedic Surgery

## 2011-12-07 ENCOUNTER — Encounter (HOSPITAL_COMMUNITY)
Admission: RE | Admit: 2011-12-07 | Discharge: 2011-12-07 | Disposition: A | Discharge status: Home / Self Care | Payer: Medicaid Other | Source: Ambulatory Visit | Attending: Orthopedic Surgery | Admitting: Orthopedic Surgery

## 2011-12-07 DIAGNOSIS — I517 Cardiomegaly: Secondary | ICD-10-CM | POA: Insufficient documentation

## 2011-12-07 DIAGNOSIS — R0602 Shortness of breath: Secondary | ICD-10-CM | POA: Insufficient documentation

## 2011-12-07 DIAGNOSIS — J9819 Other pulmonary collapse: Secondary | ICD-10-CM | POA: Insufficient documentation

## 2011-12-07 LAB — PROTIME-INR
INR: 1.05 (ref 0.00–1.49)
Prothrombin Time: 13.9 seconds (ref 11.6–15.2)

## 2011-12-07 LAB — URINALYSIS, ROUTINE W REFLEX MICROSCOPIC
Glucose, UA: NEGATIVE mg/dL
Hgb urine dipstick: NEGATIVE
Specific Gravity, Urine: 1.02 (ref 1.005–1.030)

## 2011-12-07 LAB — URINE MICROSCOPIC-ADD ON

## 2011-12-07 LAB — DIFFERENTIAL
Basophils Absolute: 0 10*3/uL (ref 0.0–0.1)
Eosinophils Absolute: 0.1 10*3/uL (ref 0.0–0.7)
Lymphocytes Relative: 36 % (ref 12–46)
Monocytes Relative: 8 % (ref 3–12)
Neutrophils Relative %: 55 % (ref 43–77)

## 2011-12-07 LAB — COMPREHENSIVE METABOLIC PANEL
Albumin: 4.1 g/dL (ref 3.5–5.2)
BUN: 8 mg/dL (ref 6–23)
Calcium: 10 mg/dL (ref 8.4–10.5)
Chloride: 101 mEq/L (ref 96–112)
Creatinine, Ser: 0.77 mg/dL (ref 0.50–1.10)
Total Bilirubin: 0.8 mg/dL (ref 0.3–1.2)

## 2011-12-07 LAB — PREPARE RBC (CROSSMATCH)

## 2011-12-07 LAB — CBC
HCT: 45 % (ref 36.0–46.0)
MCH: 31.8 pg (ref 26.0–34.0)
MCHC: 35.8 g/dL (ref 30.0–36.0)
MCV: 88.9 fL (ref 78.0–100.0)
Platelets: 313 10*3/uL (ref 150–400)
RDW: 14 % (ref 11.5–15.5)

## 2011-12-07 LAB — SURGICAL PCR SCREEN
MRSA, PCR: NEGATIVE
Staphylococcus aureus: NEGATIVE

## 2011-12-07 MED ORDER — CHLORHEXIDINE GLUCONATE 4 % EX LIQD: 60.0000 mL | Freq: Once | CUTANEOUS | Status: DC

## 2011-12-07 MED ORDER — VANCOMYCIN HCL 1000 MG IV SOLR
1500.0000 mg | INTRAVENOUS | Status: AC
  Administered 2011-12-09: 1500 mg via INTRAVENOUS
  Filled 2011-12-07 (×2): qty 1500

## 2011-12-07 NOTE — Consult Note (Signed)
Anesthesia Chart Review:  Patient is a 56 year old female scheduled for a left total hip revision on 12/09/11.  History includes HTN, murmur since childhood, smoking, prior bilateral TKR, left THR 04/2009.  She also reported a history of perioperative MI, but in reviewing her discharge summary from 10/18/07, there is only mention of cardiac arrhythmia (developed SVT by 10/29/11 EKG). She was evaluated by Cardiologist Dr. Shana Chute during that hospitalization and treated with medication, but it does not appear that any diagnostic testing was done.  She remains on Coreg and digoxin, and has not experienced any further arrhythmias.   She has since tolerated a left THR.  She reports that Dr. Shana Chute has refilled some of her cardiac medications, but she has not been seen at his office within the last few years.  Dr. Loleta Chance is her PCP.      EKG from 12/07/11 showed NSR, moderate voltage criteria for LVH. V3 shows inverted T wave, otherwise her EKG appears overall stable.  She gets occasional sharp shooting pains in her mid chest that last about 1 second and have occurred intermittently throughout her adult life, otherwise no chest pains or LE edema.  She does get mild DOE with exertion which is not new.  Her activity has been limited since her THR in 2010--she uses a walker and only does day to day activities. Exam does reveal a II/VI SEM along the LSB.  Lungs clear anteriorly, no carotid bruit or LE edema noted.     CXR from 12/07/11 showed: 1. No evidence for pneumonia.  2. Slightly progressive chronic interstitial coarsening suggestive of COPD.   Labs show moderate leukocytes, negative nitrites, + Trichomonas.  WBC 12.0.  Non-fasting glucose is 151.  I called her UA and WBC results to Dr. Montez Morita.  Differential was added.  He will follow-up with labs and speak with her PCP Dr. Loleta Chance before deciding whether he needs to postpone surgery (for anti-infective treatment).H     I did review Cardiac history and EKGs with  Anesthesiologist Dr. Katrinka Blazing.  Patient tolerated left THR in 2010 and has had no new CV symptoms since.  Okay to proceed from an Anesthesia standpoint.  Shonna Chock, PA-C

## 2011-12-07 NOTE — Pre-Procedure Instructions (Signed)
20 [name]  12/07/2011   Your procedure is scheduled on:  December 09, 2011 at 0730 AM  Report to [facility] at 0530 AM.  Call this number if you have problems the morning of surgery: [phone]   Remember:   Do not eat food:After Midnight.  May have clear liquids: up to 4 Hours before arrival.0130 AM  Clear liquids include soda, tea, black coffee, apple or grape juice, broth.  Take these medicines the morning of surgery with A SIP OF WATER: Coreg, Lanoxin and Percocet   Do not wear jewelry, make-up or nail polish.  Do not wear lotions, powders, or perfumes. You may wear deodorant.  Do not shave 48 hours prior to surgery. Men may shave face and neck.  Do not bring valuables to the hospital.  Contacts, dentures or bridgework may not be worn into surgery.  Leave suitcase in the car. After surgery it may be brought to your room.  For patients admitted to the hospital, checkout time is 11:00 AM the day of discharge.   Patients discharged the day of surgery will not be allowed to drive home.  Name and phone number of your driver:   Special Instructions: Incentive Spirometry - Practice and bring it with you on the day of surgery. and CHG Shower Use Special Wash: 1/2 bottle night before surgery and 1/2 bottle morning of surgery.   Please read over the following fact sheets that you were given: Pain Booklet, Coughing and Deep Breathing, Blood Transfusion Information, Total Joint Packet, MRSA Information and Surgical Site Infection Prevention

## 2011-12-09 ENCOUNTER — Other Ambulatory Visit: Payer: Self-pay | Admitting: Orthopedic Surgery

## 2011-12-09 ENCOUNTER — Inpatient Hospital Stay (HOSPITAL_COMMUNITY): Payer: Medicaid Other

## 2011-12-09 ENCOUNTER — Ambulatory Visit (HOSPITAL_COMMUNITY): Payer: Medicaid Other | Admitting: Vascular Surgery

## 2011-12-09 ENCOUNTER — Encounter (HOSPITAL_COMMUNITY)
Admission: RE | Disposition: A | Discharge status: Skilled Nursing Facility | Payer: Self-pay | Source: Ambulatory Visit | Attending: Orthopedic Surgery

## 2011-12-09 ENCOUNTER — Encounter (HOSPITAL_COMMUNITY): Payer: Self-pay | Admitting: Vascular Surgery

## 2011-12-09 ENCOUNTER — Encounter (HOSPITAL_COMMUNITY): Payer: Self-pay | Admitting: *Deleted

## 2011-12-09 ENCOUNTER — Inpatient Hospital Stay (HOSPITAL_COMMUNITY)
Admission: RE | Admit: 2011-12-09 | Discharge: 2011-12-14 | DRG: 467 | Disposition: A | Discharge status: Skilled Nursing Facility | Payer: Medicaid Other | Source: Ambulatory Visit | Attending: Orthopedic Surgery | Admitting: Orthopedic Surgery

## 2011-12-09 DIAGNOSIS — G8918 Other acute postprocedural pain: Secondary | ICD-10-CM

## 2011-12-09 DIAGNOSIS — Z9079 Acquired absence of other genital organ(s): Secondary | ICD-10-CM

## 2011-12-09 DIAGNOSIS — I4891 Unspecified atrial fibrillation: Secondary | ICD-10-CM | POA: Diagnosis present

## 2011-12-09 DIAGNOSIS — I252 Old myocardial infarction: Secondary | ICD-10-CM

## 2011-12-09 DIAGNOSIS — Z9889 Other specified postprocedural states: Secondary | ICD-10-CM

## 2011-12-09 DIAGNOSIS — Z880 Allergy status to penicillin: Secondary | ICD-10-CM

## 2011-12-09 DIAGNOSIS — K219 Gastro-esophageal reflux disease without esophagitis: Secondary | ICD-10-CM | POA: Diagnosis present

## 2011-12-09 DIAGNOSIS — Z96649 Presence of unspecified artificial hip joint: Secondary | ICD-10-CM

## 2011-12-09 DIAGNOSIS — Y831 Surgical operation with implant of artificial internal device as the cause of abnormal reaction of the patient, or of later complication, without mention of misadventure at the time of the procedure: Secondary | ICD-10-CM | POA: Diagnosis present

## 2011-12-09 DIAGNOSIS — I10 Essential (primary) hypertension: Secondary | ICD-10-CM | POA: Diagnosis present

## 2011-12-09 DIAGNOSIS — T84039A Mechanical loosening of unspecified internal prosthetic joint, initial encounter: Principal | ICD-10-CM | POA: Diagnosis present

## 2011-12-09 DIAGNOSIS — M1990 Unspecified osteoarthritis, unspecified site: Secondary | ICD-10-CM | POA: Diagnosis present

## 2011-12-09 DIAGNOSIS — Z79899 Other long term (current) drug therapy: Secondary | ICD-10-CM

## 2011-12-09 DIAGNOSIS — N390 Urinary tract infection, site not specified: Secondary | ICD-10-CM | POA: Diagnosis not present

## 2011-12-09 DIAGNOSIS — K5909 Other constipation: Secondary | ICD-10-CM | POA: Diagnosis not present

## 2011-12-09 DIAGNOSIS — T4275XA Adverse effect of unspecified antiepileptic and sedative-hypnotic drugs, initial encounter: Secondary | ICD-10-CM | POA: Diagnosis not present

## 2011-12-09 DIAGNOSIS — F17200 Nicotine dependence, unspecified, uncomplicated: Secondary | ICD-10-CM | POA: Diagnosis present

## 2011-12-09 DIAGNOSIS — J9819 Other pulmonary collapse: Secondary | ICD-10-CM | POA: Diagnosis not present

## 2011-12-09 DIAGNOSIS — I2510 Atherosclerotic heart disease of native coronary artery without angina pectoris: Secondary | ICD-10-CM | POA: Diagnosis present

## 2011-12-09 DIAGNOSIS — E876 Hypokalemia: Secondary | ICD-10-CM | POA: Diagnosis not present

## 2011-12-09 DIAGNOSIS — Z96659 Presence of unspecified artificial knee joint: Secondary | ICD-10-CM

## 2011-12-09 DIAGNOSIS — R000 Tachycardia, unspecified: Secondary | ICD-10-CM | POA: Diagnosis present

## 2011-12-09 HISTORY — DX: Gastro-esophageal reflux disease without esophagitis: K21.9

## 2011-12-09 HISTORY — DX: Unspecified osteoarthritis, unspecified site: M19.90

## 2011-12-09 HISTORY — DX: Cardiac arrhythmia, unspecified: I49.9

## 2011-12-09 HISTORY — DX: Shortness of breath: R06.02

## 2011-12-09 HISTORY — PX: TOTAL HIP REVISION: SHX763

## 2011-12-09 HISTORY — DX: Pneumonia, unspecified organism: J18.9

## 2011-12-09 HISTORY — PX: HIP SURGERY: SHX245

## 2011-12-09 SURGERY — TOTAL HIP REVISION
Anesthesia: General | Site: Hip | Laterality: Left | Wound class: Clean

## 2011-12-09 MED ORDER — METHOCARBAMOL 100 MG/ML IJ SOLN
500.0000 mg | Freq: Four times a day (QID) | INTRAVENOUS | Status: DC | PRN
  Filled 2011-12-09: qty 5

## 2011-12-09 MED ORDER — SODIUM CHLORIDE 0.9 % IJ SOLN: 9.0000 mL | INTRAMUSCULAR | Status: DC | PRN

## 2011-12-09 MED ORDER — WARFARIN - PHARMACIST DOSING INPATIENT: Freq: Every day | Status: DC

## 2011-12-09 MED ORDER — ONDANSETRON HCL 4 MG PO TABS: 4.0000 mg | ORAL_TABLET | Freq: Four times a day (QID) | ORAL | Status: DC | PRN

## 2011-12-09 MED ORDER — METHOCARBAMOL 500 MG PO TABS
500.0000 mg | ORAL_TABLET | Freq: Four times a day (QID) | ORAL | Status: DC | PRN
  Administered 2011-12-10 – 2011-12-14 (×7): 500 mg via ORAL
  Filled 2011-12-09 (×7): qty 1

## 2011-12-09 MED ORDER — PROPOFOL 10 MG/ML IV EMUL
INTRAVENOUS | Status: DC | PRN
  Administered 2011-12-09: 180 mg via INTRAVENOUS

## 2011-12-09 MED ORDER — CARVEDILOL 12.5 MG PO TABS
12.5000 mg | ORAL_TABLET | Freq: Two times a day (BID) | ORAL | Status: DC
  Administered 2011-12-10 – 2011-12-14 (×7): 12.5 mg via ORAL
  Filled 2011-12-09 (×12): qty 1

## 2011-12-09 MED ORDER — HYDROMORPHONE HCL PF 1 MG/ML IJ SOLN
INTRAMUSCULAR | Status: AC
  Filled 2011-12-09: qty 1

## 2011-12-09 MED ORDER — MIDAZOLAM HCL 5 MG/5ML IJ SOLN
INTRAMUSCULAR | Status: DC | PRN
  Administered 2011-12-09: 2 mg via INTRAVENOUS

## 2011-12-09 MED ORDER — LIDOCAINE HCL (CARDIAC) 20 MG/ML IV SOLN
INTRAVENOUS | Status: DC | PRN
  Administered 2011-12-09: 80 mg via INTRAVENOUS

## 2011-12-09 MED ORDER — METOCLOPRAMIDE HCL 5 MG/ML IJ SOLN: 5.0000 mg | Freq: Three times a day (TID) | INTRAMUSCULAR | Status: DC | PRN

## 2011-12-09 MED ORDER — PATIENT'S GUIDE TO USING COUMADIN BOOK
Freq: Once | Status: AC
  Administered 2011-12-09: 17:00:00
  Filled 2011-12-09: qty 1

## 2011-12-09 MED ORDER — DIGOXIN 250 MCG PO TABS
250.0000 ug | ORAL_TABLET | Freq: Every day | ORAL | Status: DC
  Administered 2011-12-10 – 2011-12-14 (×5): 250 ug via ORAL
  Filled 2011-12-09 (×5): qty 1

## 2011-12-09 MED ORDER — DEXTROSE-NACL 5-0.45 % IV SOLN
INTRAVENOUS | Status: DC
  Administered 2011-12-09: 15:00:00 via INTRAVENOUS
  Administered 2011-12-10: 75 mL/h via INTRAVENOUS
  Administered 2011-12-11: 09:00:00 via INTRAVENOUS
  Filled 2011-12-09: qty 1000

## 2011-12-09 MED ORDER — VANCOMYCIN HCL IN DEXTROSE 1-5 GM/200ML-% IV SOLN
1000.0000 mg | Freq: Two times a day (BID) | INTRAVENOUS | Status: AC
  Administered 2011-12-09: 1000 mg via INTRAVENOUS
  Filled 2011-12-09: qty 200

## 2011-12-09 MED ORDER — NALOXONE HCL 0.4 MG/ML IJ SOLN: 0.4000 mg | INTRAMUSCULAR | Status: DC | PRN

## 2011-12-09 MED ORDER — METOCLOPRAMIDE HCL 5 MG PO TABS
5.0000 mg | ORAL_TABLET | Freq: Three times a day (TID) | ORAL | Status: DC | PRN
  Filled 2011-12-09: qty 2

## 2011-12-09 MED ORDER — HETASTARCH-ELECTROLYTES 6 % IV SOLN
INTRAVENOUS | Status: DC | PRN
  Administered 2011-12-09: 09:00:00 via INTRAVENOUS

## 2011-12-09 MED ORDER — ROCURONIUM BROMIDE 100 MG/10ML IV SOLN
INTRAVENOUS | Status: DC | PRN
  Administered 2011-12-09 (×2): 10 mg via INTRAVENOUS
  Administered 2011-12-09: 50 mg via INTRAVENOUS
  Administered 2011-12-09: 5 mg via INTRAVENOUS
  Administered 2011-12-09: 10 mg via INTRAVENOUS

## 2011-12-09 MED ORDER — ONDANSETRON HCL 4 MG/2ML IJ SOLN: 4.0000 mg | Freq: Four times a day (QID) | INTRAMUSCULAR | Status: DC | PRN

## 2011-12-09 MED ORDER — DIPHENHYDRAMINE HCL 12.5 MG/5ML PO ELIX
12.5000 mg | ORAL_SOLUTION | Freq: Four times a day (QID) | ORAL | Status: DC | PRN
  Administered 2011-12-09 – 2011-12-10 (×2): 12.5 mg via ORAL
  Filled 2011-12-09 (×2): qty 10

## 2011-12-09 MED ORDER — PHENOL 1.4 % MT LIQD: 1.0000 | OROMUCOSAL | Status: DC | PRN

## 2011-12-09 MED ORDER — ACETAMINOPHEN 325 MG PO TABS
650.0000 mg | ORAL_TABLET | Freq: Four times a day (QID) | ORAL | Status: DC | PRN
  Administered 2011-12-09: 650 mg via ORAL
  Filled 2011-12-09: qty 2

## 2011-12-09 MED ORDER — SODIUM CHLORIDE 0.9 % IV SOLN
INTRAVENOUS | Status: DC | PRN
  Administered 2011-12-09: 08:00:00 via INTRAVENOUS

## 2011-12-09 MED ORDER — NEOSTIGMINE METHYLSULFATE 1 MG/ML IJ SOLN
INTRAMUSCULAR | Status: DC | PRN
  Administered 2011-12-09: 4 mg via INTRAVENOUS

## 2011-12-09 MED ORDER — WARFARIN SODIUM 2.5 MG PO TABS
2.5000 mg | ORAL_TABLET | Freq: Once | ORAL | Status: AC
  Administered 2011-12-09: 2.5 mg via ORAL
  Filled 2011-12-09: qty 1

## 2011-12-09 MED ORDER — PHENYLEPHRINE HCL 10 MG/ML IJ SOLN
10.0000 mg | INTRAVENOUS | Status: DC | PRN
  Administered 2011-12-09: 20 ug/min via INTRAVENOUS

## 2011-12-09 MED ORDER — DOCUSATE SODIUM 100 MG PO CAPS
100.0000 mg | ORAL_CAPSULE | Freq: Two times a day (BID) | ORAL | Status: DC
  Administered 2011-12-09 – 2011-12-12 (×7): 100 mg via ORAL
  Filled 2011-12-09 (×12): qty 1

## 2011-12-09 MED ORDER — FERROUS SULFATE 325 (65 FE) MG PO TABS
325.0000 mg | ORAL_TABLET | Freq: Three times a day (TID) | ORAL | Status: DC
  Administered 2011-12-09 – 2011-12-14 (×13): 325 mg via ORAL
  Filled 2011-12-09 (×17): qty 1

## 2011-12-09 MED ORDER — ACETAMINOPHEN 650 MG RE SUPP: 650.0000 mg | Freq: Four times a day (QID) | RECTAL | Status: DC | PRN

## 2011-12-09 MED ORDER — WARFARIN VIDEO
Freq: Once | Status: AC
  Administered 2011-12-10: 13:00:00

## 2011-12-09 MED ORDER — ONDANSETRON HCL 4 MG/2ML IJ SOLN
INTRAMUSCULAR | Status: DC | PRN
  Administered 2011-12-09: 4 mg via INTRAVENOUS

## 2011-12-09 MED ORDER — ONDANSETRON HCL 4 MG/2ML IJ SOLN
4.0000 mg | Freq: Four times a day (QID) | INTRAMUSCULAR | Status: DC | PRN
Start: 2011-12-09 — End: 2011-12-09

## 2011-12-09 MED ORDER — LIDOCAINE HCL 4 % MT SOLN
OROMUCOSAL | Status: DC | PRN
  Administered 2011-12-09: 4 mL via TOPICAL

## 2011-12-09 MED ORDER — EPHEDRINE SULFATE 50 MG/ML IJ SOLN
INTRAMUSCULAR | Status: DC | PRN
  Administered 2011-12-09 (×2): 5 mg via INTRAVENOUS
  Administered 2011-12-09: 10 mg via INTRAVENOUS

## 2011-12-09 MED ORDER — DIPHENHYDRAMINE HCL 50 MG/ML IJ SOLN
12.5000 mg | Freq: Four times a day (QID) | INTRAMUSCULAR | Status: DC | PRN
  Administered 2011-12-11: 12.5 mg via INTRAVENOUS
  Filled 2011-12-09: qty 1

## 2011-12-09 MED ORDER — LACTATED RINGERS IV SOLN
INTRAVENOUS | Status: DC | PRN
  Administered 2011-12-09 (×2): via INTRAVENOUS

## 2011-12-09 MED ORDER — GLYCOPYRROLATE 0.2 MG/ML IJ SOLN
INTRAMUSCULAR | Status: DC | PRN
  Administered 2011-12-09: .7 mg via INTRAVENOUS

## 2011-12-09 MED ORDER — HYDROMORPHONE 0.3 MG/ML IV SOLN
INTRAVENOUS | Status: DC
  Administered 2011-12-09: 0.999 mg via INTRAVENOUS
  Administered 2011-12-09: 14:00:00 via INTRAVENOUS
  Administered 2011-12-09: 0.999 mg via INTRAVENOUS
  Administered 2011-12-10: 1.59 mg via INTRAVENOUS
  Administered 2011-12-10: 20:00:00 via INTRAVENOUS
  Administered 2011-12-10: 1.19 mg via INTRAVENOUS
  Administered 2011-12-10: 0.4 mg via INTRAVENOUS
  Administered 2011-12-10: 0.2 mg via INTRAVENOUS
  Administered 2011-12-10: 0.999 mg via INTRAVENOUS
  Administered 2011-12-10: 0.4 mg via INTRAVENOUS
  Administered 2011-12-11: 0.995 mg via INTRAVENOUS
  Administered 2011-12-11: 1.19 mg via INTRAVENOUS
  Administered 2011-12-11: 0.4 mg via INTRAVENOUS
  Filled 2011-12-09 (×2): qty 25

## 2011-12-09 MED ORDER — HYDROMORPHONE HCL PF 1 MG/ML IJ SOLN
0.2500 mg | INTRAMUSCULAR | Status: DC | PRN
  Administered 2011-12-09 (×3): 0.5 mg via INTRAVENOUS

## 2011-12-09 MED ORDER — FENTANYL CITRATE 0.05 MG/ML IJ SOLN
INTRAMUSCULAR | Status: DC | PRN
  Administered 2011-12-09: 100 ug via INTRAVENOUS
  Administered 2011-12-09 (×3): 50 ug via INTRAVENOUS
  Administered 2011-12-09 (×2): 100 ug via INTRAVENOUS
  Administered 2011-12-09: 50 ug via INTRAVENOUS
  Administered 2011-12-09: 100 ug via INTRAVENOUS

## 2011-12-09 MED ORDER — WARFARIN SODIUM 7.5 MG PO TABS
7.5000 mg | ORAL_TABLET | Freq: Once | ORAL | Status: DC
  Filled 2011-12-09: qty 1

## 2011-12-09 MED ORDER — MENTHOL 3 MG MT LOZG: 1.0000 | LOZENGE | OROMUCOSAL | Status: DC | PRN

## 2011-12-09 SURGICAL SUPPLY — 52 items
BODY PROXIMAL BROACHED SZ A (Orthopedic Implant) ×2 IMPLANT
BRUSH FEMORAL CANAL (MISCELLANEOUS) IMPLANT
CLOTH BEACON ORANGE TIMEOUT ST (SAFETY) ×2 IMPLANT
COVER SURGICAL LIGHT HANDLE (MISCELLANEOUS) ×2 IMPLANT
DECANTER SPIKE VIAL GLASS SM (MISCELLANEOUS) ×2 IMPLANT
DRAPE C-ARM 42X72 X-RAY (DRAPES) IMPLANT
DRAPE ORTHO SPLIT 77X108 STRL (DRAPES) ×2
DRAPE PROXIMA HALF (DRAPES) ×2 IMPLANT
DRAPE SURG ORHT 6 SPLT 77X108 (DRAPES) ×2 IMPLANT
DRAPE U-SHAPE 47X51 STRL (DRAPES) ×2 IMPLANT
DRSG MEPILEX BORDER 4X12 (GAUZE/BANDAGES/DRESSINGS) ×2 IMPLANT
DRSG MEPILEX BORDER 4X8 (GAUZE/BANDAGES/DRESSINGS) IMPLANT
DURAPREP 26ML APPLICATOR (WOUND CARE) ×2 IMPLANT
ELECT BLADE 6.5 EXT (BLADE) IMPLANT
ELECT CAUTERY BLADE 6.4 (BLADE) ×2 IMPLANT
ELECT REM PT RETURN 9FT ADLT (ELECTROSURGICAL) ×2
ELECTRODE REM PT RTRN 9FT ADLT (ELECTROSURGICAL) ×1 IMPLANT
EVACUATOR 1/8 PVC DRAIN (DRAIN) IMPLANT
EVACUATOR 3/16  PVC DRAIN (DRAIN)
EVACUATOR 3/16 PVC DRAIN (DRAIN) IMPLANT
FACESHIELD LNG OPTICON STERILE (SAFETY) ×2 IMPLANT
GLOVE SS PI 9.0 STRL (GLOVE) ×2 IMPLANT
GOWN PREVENTION PLUS XLARGE (GOWN DISPOSABLE) ×8 IMPLANT
GOWN STRL NON-REIN LRG LVL3 (GOWN DISPOSABLE) ×4 IMPLANT
HANDPIECE INTERPULSE COAX TIP (DISPOSABLE)
HEAD FEM -6XOFST 36XMDLR (Orthopedic Implant) ×1 IMPLANT
HEAD MODULAR 36MM (Orthopedic Implant) ×1 IMPLANT
KIT BASIN OR (CUSTOM PROCEDURE TRAY) ×2 IMPLANT
KIT ROOM TURNOVER OR (KITS) ×2 IMPLANT
MANIFOLD NEPTUNE II (INSTRUMENTS) ×2 IMPLANT
NS IRRIG 1000ML POUR BTL (IV SOLUTION) ×2 IMPLANT
PACK TOTAL JOINT (CUSTOM PROCEDURE TRAY) ×2 IMPLANT
PAD ARMBOARD 7.5X6 YLW CONV (MISCELLANEOUS) ×4 IMPLANT
PILLOW ABDUCTION HIP (SOFTGOODS) ×2 IMPLANT
SET HNDPC FAN SPRY TIP SCT (DISPOSABLE) IMPLANT
SPONGE LAP 18X18 X RAY DECT (DISPOSABLE) IMPLANT
STAPLER VISISTAT 35W (STAPLE) ×4 IMPLANT
STEM W/LOCKING SCREW 14X150MM (Stem) ×2 IMPLANT
SUCTION FRAZIER TIP 10 FR DISP (SUCTIONS) ×2 IMPLANT
SUT ETHIBOND NAB CT1 #1 30IN (SUTURE) ×8 IMPLANT
SUT VIC AB 0 CT1 27 (SUTURE) ×2
SUT VIC AB 0 CT1 27XBRD ANBCTR (SUTURE) ×2 IMPLANT
SUT VIC AB 1 CT1 27 (SUTURE) ×2
SUT VIC AB 1 CT1 27XBRD ANBCTR (SUTURE) ×2 IMPLANT
SUT VIC AB 2-0 CT1 27 (SUTURE) ×2
SUT VIC AB 2-0 CT1 TAPERPNT 27 (SUTURE) ×2 IMPLANT
TOWEL OR 17X24 6PK STRL BLUE (TOWEL DISPOSABLE) ×2 IMPLANT
TOWEL OR 17X26 10 PK STRL BLUE (TOWEL DISPOSABLE) ×2 IMPLANT
TOWER CARTRIDGE SMART MIX (DISPOSABLE) IMPLANT
TRAY FOLEY CATH 14FR (SET/KITS/TRAYS/PACK) ×2 IMPLANT
TUBE ANAEROBIC SPECIMEN COL (MISCELLANEOUS) IMPLANT
WATER STERILE IRR 1000ML POUR (IV SOLUTION) ×8 IMPLANT

## 2011-12-09 NOTE — Anesthesia Preprocedure Evaluation (Addendum)
Anesthesia Evaluation  Patient identified by MRN, date of birth, ID band Patient awake    Reviewed: Allergy & Precautions, H&P , NPO status , Patient's Chart, lab work & pertinent test results  Airway Mallampati: II  Neck ROM: full    Dental   Pulmonary Current Smoker,          Cardiovascular hypertension, + dysrhythmias Atrial Fibrillation     Neuro/Psych    GI/Hepatic   Endo/Other    Renal/GU      Musculoskeletal  (+) Arthritis -,   Abdominal   Peds  Hematology   Anesthesia Other Findings   Reproductive/Obstetrics                          Anesthesia Physical Anesthesia Plan  ASA: III  Anesthesia Plan: General   Post-op Pain Management:    Induction: Intravenous  Airway Management Planned: Oral ETT  Additional Equipment:   Intra-op Plan:   Post-operative Plan: Extubation in OR  Informed Consent: I have reviewed the patients History and Physical, chart, labs and discussed the procedure including the risks, benefits and alternatives for the proposed anesthesia with the patient or authorized representative who has indicated his/her understanding and acceptance.     Plan Discussed with: CRNA and Surgeon  Anesthesia Plan Comments:         Anesthesia Quick Evaluation

## 2011-12-09 NOTE — Brief Op Note (Signed)
12/09/2011  10:18 AM  PATIENT:  [name]  56 y.o. female  PRE-OPERATIVE DIAGNOSIS:  loose total left hip, loose hardware left side  POST-OPERATIVE DIAGNOSIS:  loose total left hip, loose hardware left side  PROCEDURE:  Procedure(s) (LRB): TOTAL HIP REVISION (Left)  SURGEON:  Surgeon(s) and Role:    * Kennieth Rad, MD - Primary  PHYSICIAN ASSISTANT:   ASSISTANTS: none   ANESTHESIA:   general  EBL:  Total I/O In: 1400 [I.V.:1400] Out: 600 [Urine:100; Blood:500]  BLOOD ADMINISTERED:none  DRAINS: none   LOCAL MEDICATIONS USED:  NONE  SPECIMEN:  No Specimen  DISPOSITION OF SPECIMEN:  N/A  COUNTS:  YES  TOURNIQUET:  * No tourniquets in log *  DICTATION: .Other Dictation: Dictation Number [id number]  PLAN OF CARE: Admit to inpatient   PATIENT DISPOSITION:  PACU - hemodynamically stable.   Delay start of Pharmacological VTE agent (>24hrs) due to surgical blood loss or risk of bleeding: no

## 2011-12-09 NOTE — Anesthesia Postprocedure Evaluation (Signed)
Anesthesia Post Note  Patient: [name]  Procedure(s) Performed: Procedure(s) (LRB): TOTAL HIP REVISION (Left)  Anesthesia type: General  Patient location: PACU  Post pain: Pain level controlled and Adequate analgesia  Post assessment: Post-op Vital signs reviewed, Patient's Cardiovascular Status Stable, Respiratory Function Stable, Patent Airway and Pain level controlled  Last Vitals:  Filed Vitals:   12/09/11 1115  BP:   Pulse: 84  Temp:   Resp: 13    Post vital signs: Reviewed and stable  Level of consciousness: awake, alert  and oriented  Complications: No apparent anesthesia complications

## 2011-12-09 NOTE — Preoperative (Signed)
Beta Blockers   Reason not to administer Beta Blockers:Not Applicable 

## 2011-12-09 NOTE — Progress Notes (Addendum)
ANTICOAGULATION CONSULT NOTE - Initial Consult  Pharmacy Consult for warfarin Indication: VTE prophylaxis - s/p L Total hip revision  Assessment: 65 yof presenting with painful left hip over the past year, now s/p left total hip revision. Patient to start on warfarin for DVT prophylaxis tonight. Baseline INR is normal at 1.0. Calculated warfarin score point score of 4 based on age and weight.   Looking back at patient's previous records, she was discharged on 1mg  daily of warfarin back in 04/2009 with a therapeutic INR. Will dose conservatively at this time.  Goal of Therapy:  INR 2-3 Monitor platelets by anticoagulation protocol: Yes   Plan:  1.Warfarin 2.5mg  tonight 2.Daily INR 3.Warfarin education materials to bedside for patient review. 4.Follow up on patient education over next 1-3 days.  Allergies  Allergen Reactions  . Penicillins Itching    Patient Measurements: Actual Body Weight: 70.9kg  Vital Signs: Temp: 98.5 F (36.9 C) (06/06 1240) Temp src: Oral (06/06 1240) BP: 128/67 mmHg (06/06 1240) Pulse Rate: 93  (06/06 1240)  Labs:  Basename 12/07/11 1357  HGB 16.1*  HCT 45.0  PLT 313  APTT 29  LABPROT 13.9  INR 1.05  HEPARINUNFRC --  CREATININE 0.77  CKTOTAL --  CKMB --  TROPONINI --    CrCl is unknown because there is no height on file for the current visit.   Medical History: Past Medical History  Diagnosis Date  . Hypertension   . Complication of anesthesia     Heart attack during surgery  . Myocardial infarction   . Heart murmur     Medications:  Prescriptions prior to admission  Medication Sig Dispense Refill  . carvedilol (COREG) 12.5 MG tablet Take 12.5 mg by mouth 2 (two) times daily with a meal.      . cyclobenzaprine (FLEXERIL) 10 MG tablet Take 10 mg by mouth 3 (three) times daily as needed. As needed for muscle spasms.      . digoxin (LANOXIN) 0.25 MG tablet Take 250 mcg by mouth daily.      Marland Kitchen etodolac (LODINE) 400 MG tablet Take  400 mg by mouth 3 (three) times daily.      Marland Kitchen OVER THE COUNTER MEDICATION Take 1 tablet by mouth 2 (two) times daily as needed. Allergy medication. As needed for seasonal allergies.      Marland Kitchen oxyCODONE-acetaminophen (PERCOCET) 5-325 MG per tablet Take 1 tablet by mouth every 8 (eight) hours as needed. As needed for pain.        Severiano Gilbert 12/09/2011,1:17 PM

## 2011-12-09 NOTE — H&P (Signed)
NAME:  [name], OZBUN NO.:  [id number]  MEDICAL RECORD NO.:  [id number]  LOCATION:  [facility]                         FACILITY:  [facility]  PHYSICIAN:  Myrtie Neither, MD      DATE OF BIRTH:  [birth year]  DATE OF ADMISSION:  12/09/2011 DATE OF DISCHARGE:                             HISTORY & PHYSICAL   CHIEF COMPLAINT:  Painful left total hip.  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  She has developed left anterior thigh pain.  The patient has been followed over the past 6 months.  A CT scan showed loosening of the stem and intact acetabular component.  The patient was treated with anti-inflammatories and pain medication with use of cane.  The anterior thigh pain progressively worsened over the past few months.  PAST MEDICAL HISTORY:  Abdominal hysterectomy, skin graft in the past, rotator cuff arthroscopy, left total hip arthroplasty, heart murmur, myocardial infarction, hypertension.  ALLERGIES:  PENICILLIN.  MEDICATIONS:  Colace, Flexeril, Lanoxin, Lodine, Percocet.  SOCIAL HISTORY:  Use of alcohol and tobacco.  Denies use of illegal drugs.  FAMILY HISTORY:  Hypertension.  REVIEW OF SYSTEMS:  Basically that of history of present illness.  No cardiac, no respiratory symptoms, some urgency.  PHYSICAL EXAMINATION:  VITAL SIGNS:  Blood pressure 139/84, pulse 86, temperature 98.2, respirations 20, O2 saturation 94%. HEAD:  Normocephalic. EYES:  Sclerae clear. NECK:  Supple. CHEST:  Clear. CARDIAC:  S1, S2 regular. EXTREMITIES:  Left hip tender anterolaterally over the mid thigh area with pain on rotation of the left hip.  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  IMPRESSION:  Loosening of left total hip.  PLAN:  Left total hip revision.     Myrtie Neither,  MD     AC/MEDQ  D:  12/09/2011  T:  12/09/2011  Job:  [id number]

## 2011-12-09 NOTE — H&P (Signed)
[name] is an 56 y.o. female.   Chief Complaint: PAINFUL LEFT TOTAL HIP HPI: THIS IS A 55Y/O FEMALE WITH PAINFUL LEFT TOTAL HIP OVER THE PAST YEAR ,WITH ANTERIOR THIGH PAIN, CT- SCAN REVEALS LOOSE PROSTHETIC STEM.  Past Medical History  Diagnosis Date  . Hypertension   . Complication of anesthesia     Heart attack during surgery  . Myocardial infarction   . Heart murmur     Past Surgical History  Procedure Date  . Total hip arthroplasty 2010  . Replacement total knee bilateral   . Shoulder arthroscopy w/ rotator cuff repair   . Skin graft   . Abdominal hysterectomy     Family History  Problem Relation Age of Onset  . Anesthesia problems Neg Hx    Social History:  reports that she has been smoking.  She has never used smokeless tobacco. She reports that she drinks alcohol. She reports that she does not use illicit drugs.  Allergies:  Allergies  Allergen Reactions  . Penicillins Itching    Medications Prior to Admission  Medication Sig Dispense Refill  . carvedilol (COREG) 12.5 MG tablet Take 12.5 mg by mouth 2 (two) times daily with a meal.      . cyclobenzaprine (FLEXERIL) 10 MG tablet Take 10 mg by mouth 3 (three) times daily as needed. As needed for muscle spasms.      . digoxin (LANOXIN) 0.25 MG tablet Take 250 mcg by mouth daily.      Marland Kitchen etodolac (LODINE) 400 MG tablet Take 400 mg by mouth 3 (three) times daily.      Marland Kitchen OVER THE COUNTER MEDICATION Take 1 tablet by mouth 2 (two) times daily as needed. Allergy medication. As needed for seasonal allergies.      Marland Kitchen oxyCODONE-acetaminophen (PERCOCET) 5-325 MG per tablet Take 1 tablet by mouth every 8 (eight) hours as needed. As needed for pain.        Results for orders placed during the hospital encounter of 12/09/11 (from the past 48 hour(s))  DIFFERENTIAL     Status: Abnormal   Collection Time   12/07/11  1:57 PM      Component Value Range Comment   Neutro Abs 6.4  1.7 - 7.7 (K/uL)    Lymphs Abs 4.1 (*) 0.7 -  4.0 (K/uL)    Monocytes Absolute 0.9  0.1 - 1.0 (K/uL)    Eosinophils Absolute 0.1  0.0 - 0.7 (K/uL)    Basophils Absolute 0.0  0.0 - 0.1 (K/uL)    Neutrophils Relative 55  43 - 77 (%)    Lymphocytes Relative 36  12 - 46 (%)    Monocytes Relative 8  3 - 12 (%)    Eosinophils Relative 1  0 - 5 (%)    Basophils Relative 0  0 - 1 (%)    Dg Chest 2 View  12/07/2011  *RADIOLOGY REPORT*  Clinical Data: Preop left total hip  CHEST - 2 VIEW  Comparison: 10/01/2010  Findings: The heart size appears normal.  There is no pleural effusion or edema identified.  Chronic interstitial coarsening is identified bilaterally and appears slightly progressive when compared with the previous examination.  Review of the visualized osseous structures is unremarkable.  IMPRESSION:  1.  No evidence for pneumonia. 2.  Slightly progressive chronic interstitial coarsening suggestive of COPD.  Original Report Authenticated By: Rosealee Albee, M.D.    Review of Systems  Constitutional: Negative.   HENT: Negative.   Eyes: Negative.  Respiratory: Negative.   Cardiovascular: Negative.   Gastrointestinal: Negative.   Genitourinary: Negative for urgency.  Musculoskeletal: Negative for joint pain.  Skin: Negative.   Neurological: Negative.   Endo/Heme/Allergies: Negative.   Psychiatric/Behavioral: Negative.     Blood pressure 139/84, pulse 86, temperature 98.2 F (36.8 C), temperature source Oral, resp. rate 20, SpO2 94.00%. Physical Exam LEFT HIP TENDER ANTERIOR  AND LATERAL ROM IS GOOD WITH PAIN ON ROTATION IN MID-THIGH AREA . C-T SCAN REVEAL LOOSENING OF THE STEM OF THE PROSTHESIS.  Assessment/Plan LOOSE LEFT TOTAL HIP Roosvelt Harps LEFT TOTAL HIP REVISION.  Kennieth Rad 12/09/2011, 7:27 AM

## 2011-12-09 NOTE — Op Note (Signed)
NAME:  [name], [name] NO.:  [id number]  MEDICAL RECORD NO.:  [id number]  LOCATION:  [facility]                         FACILITY:  [facility]  PHYSICIAN:  Myrtie Neither, MD      DATE OF BIRTH:  1955-09-28  DATE OF PROCEDURE:  12/09/2011 DATE OF DISCHARGE:                              OPERATIVE REPORT   PREOPERATIVE DIAGNOSIS:  Loose left total hip femoral stem.  POSTOPERATIVE DIAGNOSIS:  Loose left total hip femoral stem.  ANESTHESIA:  General.  PROCEDURE:  Left total hip revision, femoral stem and femoral head component, Biomet implant.  The patient was taken to the operating room.  After given adequate preop medications, given general anesthesia and intubated.  The patient was placed in the left lateral position.  Left hip was prepped with DuraPrep and draped in sterile manner.  Bovie was used for hemostasis.  Posterior Southern approach was made going through the old previous scar, carried through the skin and subcutaneous tissue, down to the fascia.  The capsule was identified and a capsulotomy was done.  The hip was dislocated.  Femoral head was removed with a punch.  Soft tissue resection about the proximal femur was done with flexible osteotomes. The osteotomes were placed anterior, posterior, lateral, and medial. After extensive freeing of the proximal portion of the femoral stem, extractor was applied and removal of the femoral stem was possible.  The femur was intact.  Copious and abundant irrigation was done.  Reaming down the femoral canal for the femoral stem was done up to 14 mm.  This allowed good bone-to-bone grasping.  This was done down to a 60-mm, then proximal femoral reamer was put in place, type A and then type B.  Type B still connected with proximal canal quite well.  Trial rasping component with both distal stem and proximal aspect of the stem was put in place and hammered in good stable position.  A -6 femoral head neck component was put in  place.  Range of motion was tested.  There was some instability on rotation.  The components were removed and there was some rotation of the stem component, -6 head-neck component was put back in place and reduction very good stable.  Good flexion, extension, and rotation.  No telescoping.  Trial components that were used, were a -6 femoral head, neck component with a 14 mm x 150 mm porous-coated stem. Reduction of each was again tested and good flexion, extension, rotation.  No telescoping noted.  Copious irrigation was done.  Leg length was also measured and was found to be just a quater of an inch difference, long on the left side.  Wound closure was then done with the use of 0 Vicryl for the fascia, 2-0 for the subcutaneous, and skin staples for the skin.  Compressive dressing was applied.  Hip-abduction pillow was applied.  The patient tolerated procedure quite well, went to recovery room in stable and satisfactory condition.    Myrtie Neither, MD    AC/MEDQ  D:  12/09/2011  T:  12/09/2011  Job:  [id number]

## 2011-12-09 NOTE — Transfer of Care (Signed)
Immediate Anesthesia Transfer of Care Note  Patient: [name]  Procedure(s) Performed: Procedure(s) (LRB): TOTAL HIP REVISION (Left)  Patient Location: PACU  Anesthesia Type: General  Level of Consciousness: sedated  Airway & Oxygen Therapy: Patient Spontanous Breathing and Patient connected to nasal cannula oxygen  Post-op Assessment: Report given to PACU RN and Post -op Vital signs reviewed and stable  Post vital signs: Reviewed and stable  Complications: No apparent anesthesia complications

## 2011-12-09 NOTE — Plan of Care (Signed)
Problem: Consults Goal: Diagnosis- Total Joint Replacement Outcome: Completed/Met Date Met:  12/09/11 Primary Total Hip Left

## 2011-12-10 ENCOUNTER — Inpatient Hospital Stay (HOSPITAL_COMMUNITY): Payer: Medicaid Other

## 2011-12-10 ENCOUNTER — Encounter (HOSPITAL_COMMUNITY): Payer: Self-pay | Admitting: Orthopedic Surgery

## 2011-12-10 DIAGNOSIS — R509 Fever, unspecified: Secondary | ICD-10-CM

## 2011-12-10 LAB — BLOOD GAS, ARTERIAL
O2 Content: 3 L/min
O2 Saturation: 89.4 %
Patient temperature: 102.6
pH, Arterial: 7.392 (ref 7.350–7.400)

## 2011-12-10 LAB — URINALYSIS, ROUTINE W REFLEX MICROSCOPIC
Ketones, ur: NEGATIVE mg/dL
Nitrite: NEGATIVE
Protein, ur: NEGATIVE mg/dL
Specific Gravity, Urine: 1.013 (ref 1.005–1.030)
Urobilinogen, UA: 1 mg/dL (ref 0.0–1.0)

## 2011-12-10 LAB — CBC
Hemoglobin: 11.3 g/dL — ABNORMAL LOW (ref 12.0–15.0)
MCH: 31.7 pg (ref 26.0–34.0)
MCV: 89.9 fL (ref 78.0–100.0)
RBC: 3.57 MIL/uL — ABNORMAL LOW (ref 3.87–5.11)

## 2011-12-10 LAB — COMPREHENSIVE METABOLIC PANEL
ALT: 21 U/L (ref 0–35)
CO2: 20 mEq/L (ref 19–32)
Calcium: 8.2 mg/dL — ABNORMAL LOW (ref 8.4–10.5)
GFR calc Af Amer: 73 mL/min — ABNORMAL LOW (ref >90)
GFR calc non Af Amer: 63 mL/min — ABNORMAL LOW (ref >90)
Glucose, Bld: 183 mg/dL — ABNORMAL HIGH (ref 70–99)
Sodium: 134 mEq/L — ABNORMAL LOW (ref 135–145)

## 2011-12-10 LAB — CARDIAC PANEL(CRET KIN+CKTOT+MB+TROPI)
CK, MB: 4.2 ng/mL — ABNORMAL HIGH (ref 0.3–4.0)
Troponin I: <0.3 ng/mL (ref <0.30)

## 2011-12-10 LAB — PROCALCITONIN: Procalcitonin: 0.31 ng/mL

## 2011-12-10 LAB — LACTIC ACID, PLASMA: Lactic Acid, Venous: 2.1 mmol/L (ref 0.5–2.2)

## 2011-12-10 MED ORDER — WARFARIN SODIUM 2.5 MG PO TABS
2.5000 mg | ORAL_TABLET | Freq: Once | ORAL | Status: AC
  Administered 2011-12-10: 2.5 mg via ORAL
  Filled 2011-12-10: qty 1

## 2011-12-10 MED ORDER — ACETAMINOPHEN 325 MG PO TABS
650.0000 mg | ORAL_TABLET | ORAL | Status: DC | PRN
  Administered 2011-12-10 – 2011-12-11 (×3): 650 mg via ORAL
  Filled 2011-12-10 (×4): qty 2

## 2011-12-10 MED ORDER — ACETAMINOPHEN 325 MG PO TABS: 650.0000 mg | ORAL_TABLET | Freq: Four times a day (QID) | ORAL | Status: DC | PRN

## 2011-12-10 MED ORDER — VANCOMYCIN HCL IN DEXTROSE 1-5 GM/200ML-% IV SOLN
1000.0000 mg | Freq: Two times a day (BID) | INTRAVENOUS | Status: AC
  Administered 2011-12-10 – 2011-12-12 (×6): 1000 mg via INTRAVENOUS
  Filled 2011-12-10 (×6): qty 200

## 2011-12-10 MED ORDER — ACETAMINOPHEN 650 MG RE SUPP: 650.0000 mg | RECTAL | Status: DC | PRN

## 2011-12-10 MED ORDER — SODIUM CHLORIDE 0.9 % IV SOLN
Freq: Once | INTRAVENOUS | Status: AC
  Administered 2011-12-10: 09:00:00 via INTRAVENOUS

## 2011-12-10 MED ORDER — PIPERACILLIN-TAZOBACTAM 3.375 G IVPB
3.3750 g | Freq: Three times a day (TID) | INTRAVENOUS | Status: AC
  Administered 2011-12-10 – 2011-12-12 (×9): 3.375 g via INTRAVENOUS
  Filled 2011-12-10 (×9): qty 50

## 2011-12-10 MED ORDER — SODIUM CHLORIDE 0.9 % IV SOLN
Freq: Once | INTRAVENOUS | Status: AC
  Administered 2011-12-10: 05:00:00 via INTRAVENOUS

## 2011-12-10 NOTE — Progress Notes (Signed)
Subjective: Patient feeling ok; no Cp, no SOB, abdominal pain or cough. Still with fever in the 102 range.  Objective: Vital signs in last 24 hours: Temp:  [97.3 F (36.3 C)-103.2 F (39.6 C)] 102.6 F (39.2 C) (06/07 0458) Pulse Rate:  [81-115] 112  (06/07 0458) Resp:  [12-34] 20  (06/07 0750) BP: (110-165)/(58-84) 131/64 mmHg (06/07 0458) SpO2:  [90 %-100 %] 95 % (06/07 0750) Weight:  [70.9 kg (156 lb 4.9 oz)] 70.9 kg (156 lb 4.9 oz) (06/07 0300) Weight change:  Last BM Date: 12/08/11  Intake/Output from previous day: 06/06 0701 - 06/07 0700 In: 3650 [I.V.:2900; IV Piggyback:750] Out: 2125 [Urine:1625; Blood:500]     Physical Exam: General: Alert, awake, oriented x3, in no acute distress. HEENT: No bruits, no goiter. Heart: Mild tachycardia, positive systolic murmur, No rubs or gallops. Lungs: Clear to auscultation bilaterally. Abdomen: Soft, nontender, nondistended, positive bowel sounds. Extremities: No edema; leg stabilizer on left leg in place. Neuro: Grossly intact, nonfocal.   Lab Results: Basic Metabolic Panel:  Basename 12/10/11 0450 12/07/11 1357  NA 134* 137  K 3.7 4.1  CL 98 101  CO2 20 20  GLUCOSE 183* 151*  BUN 6 8  CREATININE 0.98 0.77  CALCIUM 8.2* 10.0  MG -- --  PHOS -- --   Liver Function Tests:  Basename 12/10/11 0450 12/07/11 1357  AST 39* 37  ALT 21 32  ALKPHOS 74 109  BILITOT 1.2 0.8  PROT 5.6* 7.8  ALBUMIN 2.7* 4.1   CBC:  Basename 12/10/11 0450 12/07/11 1357  WBC 14.0* 12.0*  NEUTROABS -- 6.4  HGB 11.3* 16.1*  HCT 32.1* 45.0  MCV 89.9 88.9  PLT 209 313   Cardiac Enzymes:  Basename 12/10/11 0450  CKTOTAL 868*  CKMB 4.2*  CKMBINDEX --  TROPONINI <0.30   Coagulation:  Basename 12/10/11 0450 12/07/11 1357  LABPROT 15.8* 13.9  INR 1.23 1.05   Urinalysis:  Basename 12/10/11 0501 12/07/11 1355  COLORURINE AMBER* AMBER*  LABSPEC 1.013 1.020  PHURINE 5.0 5.0  GLUCOSEU NEGATIVE NEGATIVE  HGBUR MODERATE* NEGATIVE   BILIRUBINUR SMALL* LARGE*  KETONESUR NEGATIVE 15*  PROTEINUR NEGATIVE NEGATIVE  UROBILINOGEN 1.0 1.0  NITRITE NEGATIVE NEGATIVE  LEUKOCYTESUR SMALL* MODERATE*   Misc. Labs:  Recent Results (from the past 240 hour(s))  SURGICAL PCR SCREEN     Status: Normal   Collection Time   12/07/11  1:56 PM      Component Value Range Status Comment   MRSA, PCR NEGATIVE  NEGATIVE  Final    Staphylococcus aureus NEGATIVE  NEGATIVE  Final     Studies/Results: Dg Chest Port 1v Same Day  12/10/2011  *RADIOLOGY REPORT*  Clinical Data: - Shortness of breath.  PORTABLE CHEST - 1 VIEW SAME DAY  Comparison: 12/07/2011.  Findings: The heart is mildly enlarged but stable.  The mediastinal and hilar contours are prominent but unchanged.  Very low lung volumes with vascular crowding and atelectasis.  No overt pulmonary edema or pleural effusions.  Bibasilar infiltrates are possible.  IMPRESSION: Low lung volumes with vascular crowding and atelectasis.  Cannot exclude bibasilar infiltrates.  Original Report Authenticated By: P. Loralie Champagne, M.D.   Dg Hip Portable 1 View Left  12/09/2011  *RADIOLOGY REPORT*  Clinical Data: 56 year old female status post left total hip revision.  PORTABLE LEFT HIP - 1 VIEW  Comparison: 01/21/2011.  Findings: AP portable view 1215 hours.  Interval revision of at least the left femoral component which appears intact.  Hardware alignment  within normal limits.  Postoperative changes to the overlying soft tissues.  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  No definite unexpected fracture or dislocation.  IMPRESSION: Revision of left total hip arthroplasty, no adverse features identified.  Original Report Authenticated By: Harley Hallmark, M.D.    Medications: Scheduled Meds:   . sodium chloride   Intravenous Once  . sodium chloride   Intravenous Once  . carvedilol  12.5 mg Oral BID WC  . digoxin  250 mcg Oral Daily  . docusate sodium  100 mg Oral BID  .  ferrous sulfate  325 mg Oral TID PC  . HYDROmorphone      . HYDROmorphone      . HYDROmorphone PCA 0.3 mg/mL   Intravenous Q4H  . patient's guide to using coumadin book   Does not apply Once  . piperacillin-tazobactam (ZOSYN)  IV  3.375 g Intravenous Q8H  . vancomycin  1,000 mg Intravenous Q12H  . vancomycin  1,000 mg Intravenous Q12H  . warfarin  2.5 mg Oral ONCE-1800  . warfarin  2.5 mg Oral ONCE-1800  . warfarin   Does not apply Once  . Warfarin - Pharmacist Dosing Inpatient   Does not apply q1800  . DISCONTD: warfarin  7.5 mg Oral ONCE-1800   Continuous Infusions:   . dextrose 5 % and 0.45% NaCl 75 mL/hr at 12/10/11 0647   PRN Meds:.acetaminophen, acetaminophen, diphenhydrAMINE, diphenhydrAMINE, menthol-cetylpyridinium, methocarbamol (ROBAXIN) IV, methocarbamol, metoCLOPramide (REGLAN) injection, metoCLOPramide, naloxone, ondansetron (ZOFRAN) IV, ondansetron, phenol, sodium chloride, DISCONTD: acetaminophen, DISCONTD: acetaminophen, DISCONTD: acetaminophen, DISCONTD: acetaminophen, DISCONTD:  HYDROmorphone (DILAUDID) injection, DISCONTD: ondansetron (ZOFRAN) IV DISCONTD: ondansetron (ZOFRAN) IV  Assessment/Plan: 1-Fever: CXR with atelectasis vs bibasilar infiltrates; also UA with moderate leukocyte esterase. Will continue empiric broad spectrum antibiotic therapy for now and follow blood and urine culture. Other source might be her hip; but OR reports no signs of infection. Will encourage spirometry use and use PRN tylenol for fever.  2-HTN: well controlled; continue current regimen.  3-Atrial fibrillation: continue digoxin, carvedilol and also coumadin per pharmacy.  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 S/p surgical revision; apparently doing ok. Further recommendations per ortho.    LOS: 1 day   Birtie Fellman [facility] [phone]  12/10/2011, 10:17 AM

## 2011-12-10 NOTE — Consult Note (Signed)
Requesting physician: Dr. Montez Morita  Reason for consultation: Fever   History of Present Illness: 56 year old female with history of HTN, CAD who was admitted by orthopedics for left hip pain and is now status post left hip total revision (12/09/2011). Reason was medicine consult is due to patient spiking new fever. At this time patient seem to hemodynamically stable with BP 139/79 and HR 100. She spike T max 103.2 F. Patient does report having urinary frequency and urgency but no obvious blood in urine. No complaints of chest pain, no obvious shortness of breath, no palpitations. No diarrhea or constipation, no abdominal pain or nausea or vomiting.  Allergies:   Allergies  Allergen Reactions  . Penicillins Itching      Past Medical History  Diagnosis Date  . Hypertension   . Complication of anesthesia     Heart attack during surgery  . Myocardial infarction   . Heart murmur   . Shortness of breath   . Pneumonia     hx of pna  . Dysrhythmia     atrial fibrilation  . GERD (gastroesophageal reflux disease)   . Arthritis     Past Surgical History  Procedure Date  . Total hip arthroplasty 2010  . Replacement total knee bilateral   . Shoulder arthroscopy w/ rotator cuff repair   . Skin graft   . Abdominal hysterectomy   . Hip surgery 12/09/2011    revision    Medications:  Scheduled Meds:   . carvedilol  12.5 mg Oral BID WC  . digoxin  250 mcg Oral Daily  . docusate sodium  100 mg Oral BID  . ferrous sulfate  325 mg Oral TID PC  . HYDROmorphone      . HYDROmorphone      . HYDROmorphone PCA 0.3 mg/mL   Intravenous Q4H  . patient's guide to using coumadin book   Does not apply Once  . vancomycin  1,500 mg Intravenous 120 min pre-op  . vancomycin  1,000 mg Intravenous Q12H  . warfarin  2.5 mg Oral ONCE-1800  . warfarin   Does not apply Once  . Warfarin - Pharmacist Dosing Inpatient   Does not apply q1800  . DISCONTD: warfarin  7.5 mg Oral ONCE-1800   Continuous  Infusions:   . dextrose 5 % and 0.45% NaCl 75 mL/hr at 12/09/11 1522   PRN Meds:.acetaminophen, acetaminophen, diphenhydrAMINE, diphenhydrAMINE, menthol-cetylpyridinium, methocarbamol (ROBAXIN) IV, methocarbamol, metoCLOPramide (REGLAN) injection, metoCLOPramide, naloxone, ondansetron (ZOFRAN) IV, ondansetron, phenol, sodium chloride, DISCONTD:  HYDROmorphone (DILAUDID) injection, DISCONTD: ondansetron (ZOFRAN) IV, DISCONTD: ondansetron (ZOFRAN) IV  Social History:  reports that she has been smoking.  She has never used smokeless tobacco. She reports that she drinks alcohol. She reports that she does not use illicit drugs.  Family History  Problem Relation Age of Onset  . Anesthesia problems Neg Hx     Review of Systems:  Constitutional: positive for fever, chills, no diaphoresis, appetite change and fatigue.  HEENT: Denies photophobia, eye pain, redness, hearing loss, ear pain, congestion, sore throat, rhinorrhea, sneezing, mouth sores, trouble swallowing, neck pain, neck stiffness and tinnitus.   Respiratory: Denies SOB, DOE, cough, chest tightness,  and wheezing.   Cardiovascular: Denies chest pain, palpitations and leg swelling.  Gastrointestinal: Denies nausea, vomiting, abdominal pain, diarrhea, constipation, blood in stool and abdominal distention.  Genitourinary: Denies dysuria, urgency, frequency, hematuria, flank pain and difficulty urinating.  Musculoskeletal: Denies myalgias, back pain, joint swelling, arthralgias and gait problem.  Skin: Denies pallor, rash  and wound.  Neurological: Denies dizziness, seizures, syncope, weakness, light-headedness, numbness and headaches.  Hematological: Denies adenopathy. Easy bruising, personal or family bleeding history  Psychiatric/Behavioral: Denies suicidal ideation, mood changes, confusion, nervousness, sleep disturbance and agitation   Physical Exam:  Filed Vitals:   12/09/11 2324 12/10/11 0000 12/10/11 0117 12/10/11 0232  BP:        Pulse:      Temp: 102.6 F (39.2 C)  101.8 F (38.8 C) 102.1 F (38.9 C)  TempSrc: Oral  Oral Oral  Resp:  20    SpO2:  93%       Intake/Output Summary (Last 24 hours) at 12/10/11 0346 Last data filed at 12/09/11 2100  Gross per 24 hour  Intake   2500 ml  Output   2125 ml  Net    375 ml    General: Alert, awake, oriented x3, in no acute distress. HEENT: No bruits, no goiter. Moist mucous membranes. Heart: Regular rate and rhythm, without murmurs, rubs, gallops. Lungs: Clear to auscultation bilaterally. No wheezing, no rhonchi, no rales. Abdomen: Soft, nontender, nondistended, positive bowel sounds. Extremities: No clubbing or cyanosis, no edema, positive pedal pulses. Neuro: Grossly nonfocal.  Labs on Admission:  CBC:    Component Value Date/Time   WBC 12.0* 12/07/2011 1357   HGB 16.1* 12/07/2011 1357   HCT 45.0 12/07/2011 1357   PLT 313 12/07/2011 1357   MCV 88.9 12/07/2011 1357   NEUTROABS 6.4 12/07/2011 1357   LYMPHSABS 4.1* 12/07/2011 1357   MONOABS 0.9 12/07/2011 1357   EOSABS 0.1 12/07/2011 1357   BASOSABS 0.0 12/07/2011 1357    Basic Metabolic Panel:    Component Value Date/Time   NA 137 12/07/2011 1357   K 4.1 12/07/2011 1357   CL 101 12/07/2011 1357   CO2 20 12/07/2011 1357   BUN 8 12/07/2011 1357   CREATININE 0.77 12/07/2011 1357   GLUCOSE 151* 12/07/2011 1357   CALCIUM 10.0 12/07/2011 1357    Lab 12/07/11 1357  WBC 12.0*  HGB 16.1*  HCT 45.0  PLT 313  MCV 88.9  MCH 31.8  MCHC 35.8  RDW 14.0  LYMPHSABS 4.1*  MONOABS 0.9  EOSABS 0.1  BASOSABS 0.0  BANDABS --    Lab 12/07/11 1357  NA 137  K 4.1  CL 101  CO2 20  GLUCOSE 151*  BUN 8  CREATININE 0.77  CALCIUM 10.0  MG --    Lab 12/07/11 1357  INR 1.05  PROTIME --   Cardiac markers: No results found for this basename: CK:3,CKMB:3,TROPONINI:3,MYOGLOBIN:3 in the last 168 hours No components found with this basename: POCBNP:3 Recent Results (from the past 240 hour(s))  SURGICAL PCR SCREEN     Status: Normal    Collection Time   12/07/11  1:56 PM      Component Value Range Status Comment   MRSA, PCR NEGATIVE  NEGATIVE  Final    Staphylococcus aureus NEGATIVE  NEGATIVE  Final     Radiological Exams on Admission: Dg Hip Portable 1 View Left 12/09/2011  *RADIOLOGY REPORT*  Clinical Data: 56 year old female status post left total hip revision.  PORTABLE LEFT HIP - 1 VIEW  Comparison: 01/21/2011.  Findings: AP portable view 1215 hours.  Interval revision of at least the left femoral component which appears intact.  Hardware alignment within normal limits.  Postoperative changes to the overlying soft tissues.  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  No definite unexpected fracture or dislocation.  IMPRESSION: Revision of left total  hip arthroplasty, no adverse features identified.  Original Report Authenticated By: Harley Hallmark, M.D.    Dg Chest 2 View 12/07/2011  *RADIOLOGY REPORT*  Clinical Data: Preop left total hip  CHEST - 2 VIEW  Comparison: 10/01/2010  Findings: The heart size appears normal.  There is no pleural effusion or edema identified.  Chronic interstitial coarsening is identified bilaterally and appears slightly progressive when compared with the previous examination.  Review of the visualized osseous structures is unremarkable.  IMPRESSION:  1.  No evidence for pneumonia. 2.  Slightly progressive chronic interstitial coarsening suggestive of COPD.  Original Report Authenticated By: Rosealee Albee, M.D.    Assessment/Plan  Active Problems:  Fever - at this time it is unclear what the source of infection is, UTI likely - please start empiric broad spectrum antibiotics vanco and zosyn - follow up blood and urine culture results - follow up CXR, lactic acid and procalcitonin level, CMET and CBC  - at this time patient is hemodynamically stable with BP 139/79 and HR 100, saturating well on room air  - we will notify am [facility] to follow up on this patient  Time  Spent on Admission: Over 30 minutes  Kyomi Hector 12/10/2011, 3:46 AM  [facility] [phone]

## 2011-12-10 NOTE — Progress Notes (Signed)
Physical Therapy Evaluation Note  Past Medical History  Diagnosis Date  . Hypertension   . Complication of anesthesia     Heart attack during surgery  . Myocardial infarction   . Heart murmur   . Shortness of breath   . Pneumonia     hx of pna  . Dysrhythmia     atrial fibrilation  . GERD (gastroesophageal reflux disease)   . Arthritis    Past Surgical History  Procedure Date  . Total hip arthroplasty 2010  . Replacement total knee bilateral   . Shoulder arthroscopy w/ rotator cuff repair   . Skin graft   . Abdominal hysterectomy   . Hip surgery 12/09/2011    revision     12/10/11 0749  PT Visit Information  Last PT Received On 12/10/11  Assistance Needed +2  PT Time Calculation  PT Start Time 0749  PT Stop Time 0826  PT Time Calculation (min) 37 min  Subjective Data  Subjective Pt received supine in bed with c/o 7/10 L hip.  Precautions  Precautions Posterior Hip  Precaution Booklet Issued Yes (comment)  Precaution Comments went over at beginning of session, pt unable to recall prec at end of sesesion  Restrictions  Weight Bearing Restrictions Yes  LLE Weight Bearing PWB  LLE Partial Weight Bearing Percentage or Pounds 50  Home Living  Lives With Alone  Available Help at Discharge (none)  Type of Home Apartment  Home Access Stairs to enter  Entrance Stairs-Number of Steps 8  Entrance Stairs-Rails Can reach both  Home Layout Two level  Alternate Level Stairs-Number of Steps 13  Alternate Level Stairs-Rails Left  Bathroom Shower/Tub Programmer, multimedia Yes  How Accessible Accessible via walker  Home Adaptive Equipment Walker - rolling;Bedside commode/3-in-1;Straight cane  Prior Function  Level of Independence Independent  Able to Take Stairs? Yes  Driving No  Communication  Communication No difficulties  Cognition  Overall Cognitive Status Appears within functional limits for tasks assessed/performed    Arousal/Alertness Awake/alert  Orientation Level Oriented X4 / Intact  Behavior During Session [facility] for tasks performed  Right Upper Extremity Assessment  RUE ROM/Strength/Tone WFL  Left Upper Extremity Assessment  LUE ROM/Strength/Tone WFL  Right Lower Extremity Assessment  RLE ROM/Strength/Tone WFL  Left Lower Extremity Assessment  LLE ROM/Strength/Tone Deficits;Due to pain  LLE ROM/Strength/Tone Deficits minimal voluntary mvmt due to pain  Trunk Assessment  Trunk Assessment Normal  Bed Mobility  Bed Mobility Supine to Sit  Supine to Sit 1: +2 Total assist;HOB elevated  Supine to Sit: Patient Percentage 30%  Details for Bed Mobility Assistance maxA for trunk elevation and L LE management, maxA to scoot to EOB via bed pad  Transfers  Transfers Sit to Stand;Stand to Sit;Stand Pivot Transfers  Sit to Stand 1: +2 Total assist;From elevated surface;Without upper extremity assist;From bed  Sit to Stand: Patient Percentage 40%  Stand to Sit 1: +2 Total assist;With upper extremity assist;To chair/3-in-1  Stand to Sit: Patient Percentage 40%  Stand Pivot Transfers 1: +2 Total assist;With armrests (with RW)  Stand Pivot Transfers: Patient Percentage 30%  Details for Transfer Assistance maxA to advance L LE during std pvt, max directional v/c's for sequencing. std pvt to [facility], maxA for walker management (pt supervision with performing hygiene s/p urination but required mod/maxA to maintain standing)  Ambulation/Gait  Ambulation/Gait Assistance Not tested (comment) (pt unable to tolerate)  Exercises  Exercises Total Joint (hand out provided)  Total  Joint Exercises  Ankle Circles/Pumps PROM;Both;5 reps;Seated (with LEs elevated)  Quad Sets AROM;Left;5 reps;Seated (with LEs elevated)  Gluteal Sets AROM;Both;5 reps;Seated  PT - End of Session  Equipment Utilized During Treatment Gait belt  Activity Tolerance Patient limited by pain  Patient left in chair;with call bell/phone within  reach  Nurse Communication Mobility status;Weight bearing status  PT Assessment  Clinical Impression Statement Pt s/p L hip revision presenting with increased L hip pain limiting all mobility and requiring maxAx2 for all mobility at this time. Patient lives alone and has 21 steps to access home. Patient unable to return home safely and would benefit from SNF placement ot maximize functional recovery for safe transition home.  PT Recommendation/Assessment Patient needs continued PT services  PT Problem List Decreased strength;Decreased range of motion;Decreased activity tolerance;Decreased balance;Decreased mobility  Barriers to Discharge Inaccessible home environment;Decreased caregiver support  PT Therapy Diagnosis  Difficulty walking;Abnormality of gait;Generalized weakness;Acute pain  PT Plan  PT Frequency 7X/week  PT Treatment/Interventions DME instruction;Gait training;Functional mobility training;Therapeutic activities;Therapeutic exercise  PT Recommendation  Follow Up Recommendations Skilled nursing facility  Equipment Recommended Defer to next venue  Individuals Consulted  Consulted and Agree with Results and Recommendations Patient  Acute Rehab PT Goals  PT Goal Formulation With patient  Time For Goal Achievement 12/24/11  Potential to Achieve Goals Good  Pt will go Supine/Side to Sit with supervision;with HOB 0 degrees  PT Goal: Supine/Side to Sit - Progress Goal set today  Pt will go Sit to Supine/Side with supervision;with HOB 0 degrees  PT Goal: Sit to Supine/Side - Progress Goal set today  Pt will Transfer Bed to Chair/Chair to Bed with supervision (with RW, L LE PWB.)  PT Transfer Goal: Bed to Chair/Chair to Bed - Progress Goal set today  Pt will Ambulate 51 - 150 feet;with supervision;with rolling walker (with L LE PWB.)  PT Goal: Ambulate - Progress Goal set today  Pt will Perform Home Exercise Program Independently  PT Goal: Perform Home Exercise Program - Progress  Goal set today  Additional Goals  Additional Goal #1 Pt able to recall 3/3 post hip precautions and maintain 100% compliance with them.  PT Goal: Additional Goal #1 - Progress Goal set today  PT General Charges  $$ ACUTE PT VISIT 1 Procedure  PT Evaluation  $Initial PT Evaluation Tier II 1 Procedure  PT Treatments  $Therapeutic Activity 8-22 mins  Written Expression  Dominant Hand Right     Pain: 8/10 in L hip upon PT arrival, 10/10 L hip pain with mobility  Lewis Shock, PT, DPT Pager #: [phone] Office #: [phone]

## 2011-12-10 NOTE — Progress Notes (Signed)
CARE MANAGEMENT NOTE 12/10/2011  Patient:  SKYLINN, VIALPANDO   Account Number:  [id number]  Date Initiated:  12/10/2011  Documentation initiated by:  Vance Peper  Subjective/Objective Assessment:   56 yr old female s/p left total hip revision.     Action/Plan:   CM spoke with patient regarding home Health needs at discharge. States she has to go to SNF for shortterm rehab. Doesnt want to go to [facility] or one on [address], wants [place]. Notified Child psychotherapist of request.   Anticipated DC Date:  12/13/2011   Anticipated DC Plan:  SKILLED NURSING FACILITY  In-house referral  Clinical Social Worker      DC Planning Services  CM consult      Choice offered to / List presented to:             Status of service:  Completed, signed off  Discharge Disposition:  SKILLED NURSING FACILITY
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
## 2011-12-10 NOTE — Progress Notes (Signed)
2202 T-103.2, HR 100-115, RR 20-24. Patient denies having SOB or chest pain. States only hip pain at 8/10. PCA encouraged. Tylenol 650mg  given at 2214. @2324  T-102.6. @ 0117 T-101.8. @0232  T-102.1. Dr Montez Morita paged d/t temp beginning to increase. Patient's output was 1300. Fluids given and encouraged. Patient is not in any distress. Dr. Montez Morita returned called and ordered STAT CXR, EKG, and ABG. He also stated that patient had a UTI which may complicate her as well. Orders placed. Dr. Montez Morita called back and stated he asked in house doctor to come see patient and wants to order cardiac enzymes. Portable CXR now in to do x ray. Order for cardiac enzymes placed. Will continue to monitor.

## 2011-12-10 NOTE — Progress Notes (Signed)
ANTIBIOTIC CONSULT NOTE - INITIAL  Pharmacy Consult for vancomycin and zosyn Indication: fever s/p hip revision.   Allergies  Allergen Reactions  . Penicillins Itching    Patient Measurements: Height: 5' 5.5" (166.4 cm) Weight: 156 lb 4.9 oz (70.9 kg) IBW/kg (Calculated) : 58.15  Adjusted Body Weight:   Vital Signs: Temp: 102.1 F (38.9 C) (06/07 0232) Temp src: Oral (06/07 0232) BP: 139/73 mmHg (06/06 2202) Pulse Rate: 100  (06/06 2216) Intake/Output from previous day: 06/06 0701 - 06/07 0700 In: 2500 [I.V.:2000; IV Piggyback:500] Out: 2125 [Urine:1625; Blood:500] Intake/Output from this shift: Total I/O In: -  Out: 1300 [Urine:1300]  Labs:  [facility] 12/07/11 1357  WBC 12.0*  HGB 16.1*  PLT 313  LABCREA --  CREATININE 0.77   Estimated Creatinine Clearance: 77.5 ml/min (by C-G formula based on Cr of 0.77). No results found for this basename: VANCOTROUGH:2,VANCOPEAK:2,VANCORANDOM:2,GENTTROUGH:2,GENTPEAK:2,GENTRANDOM:2,TOBRATROUGH:2,TOBRAPEAK:2,TOBRARND:2,AMIKACINPEAK:2,AMIKACINTROU:2,AMIKACIN:2, in the last 72 hours   Microbiology: Recent Results (from the past 720 hour(s))  SURGICAL PCR SCREEN     Status: Normal   Collection Time   12/07/11  1:56 PM      Component Value Range Status Comment   MRSA, PCR NEGATIVE  NEGATIVE  Final    Staphylococcus aureus NEGATIVE  NEGATIVE  Final     Medical History: Past Medical History  Diagnosis Date  . Hypertension   . Complication of anesthesia     Heart attack during surgery  . Myocardial infarction   . Heart murmur   . Shortness of breath   . Pneumonia     hx of pna  . Dysrhythmia     atrial fibrilation  . GERD (gastroesophageal reflux disease)   . Arthritis     Medications:  Prescriptions prior to admission  Medication Sig Dispense Refill  . carvedilol (COREG) 12.5 MG tablet Take 12.5 mg by mouth 2 (two) times daily with a meal.      . cyclobenzaprine (FLEXERIL) 10 MG tablet Take 10 mg by mouth 3 (three)  times daily as needed. As needed for muscle spasms.      . digoxin (LANOXIN) 0.25 MG tablet Take 250 mcg by mouth daily.      Marland Kitchen etodolac (LODINE) 400 MG tablet Take 400 mg by mouth 3 (three) times daily.      Marland Kitchen OVER THE COUNTER MEDICATION Take 1 tablet by mouth 2 (two) times daily as needed. Allergy medication. As needed for seasonal allergies.      Marland Kitchen oxyCODONE-acetaminophen (PERCOCET) 5-325 MG per tablet Take 1 tablet by mouth every 8 (eight) hours as needed. As needed for pain.       Assessment: pt has spiked 103 fever s/p hip revision. R/o pna vs uti cardiac enzymes pending     Goal of Therapy:  Vancomycin trough level 15-20 mcg/ml  Plan:  vancomcyin 1gm q12h  Zosyn 3.375 q8h  F/u cxs  Janice Coffin 12/10/2011,3:54 AM

## 2011-12-10 NOTE — Progress Notes (Addendum)
ANTICOAGULATION CONSULT NOTE - Follow Up Consult  Pharmacy Consult for Coumadin Indication: VTE prophylaxis - s/p L Total hip revision  Allergies  Allergen Reactions  . Penicillins Itching    Patient Measurements: Height: 5' 5.5" (166.4 cm) Weight: 156 lb 4.9 oz (70.9 kg) IBW/kg (Calculated) : 58.15   Vital Signs: Temp: 102.6 F (39.2 C) (06/07 0458) Temp src: Oral (06/07 0458) BP: 131/64 mmHg (06/07 0458) Pulse Rate: 112  (06/07 0458)  Labs:  Basename 12/10/11 0450 12/07/11 1357  HGB 11.3* 16.1*  HCT 32.1* 45.0  PLT 209 313  APTT -- 29  LABPROT 15.8* 13.9  INR 1.23 1.05  HEPARINUNFRC -- --  CREATININE 0.98 0.77  CKTOTAL 868* --  CKMB 4.2* --  TROPONINI <0.30 --    Estimated Creatinine Clearance: 63.3 ml/min (by C-G formula based on Cr of 0.98).   Assessment: 73 yof s/p left total hip revision. Patient started on warfarin for DVT prophylaxis. INR below goal, as expected s/p one dose of Coumadin.  Noted H/H decreased, Plts stable, no bleeding reported. Noted Vanc/Zosyn started empirically this AM d/t fever spike. Noted PCN allx, pt tolerated Zosyn ok per RN. Pt continues to be febrile, WBC is elevated. Blood and urine cultures pending. Noted CK and CKMB elevated, trop WNL.  Per patient's previous records, she was discharged on 1mg  daily of warfarin back in 04/2009 with a therapeutic INR. Will dose conservatively at this time.  Goal of Therapy:  INR 2-3 Monitor platelets by anticoagulation protocol: Yes   Plan:  - Repeat Coumadin 2.5mg  PO x 1 today - Will f/up daily INR - Will monitor cx/sens, renal fn and clinical status daily. - Consider Lovenox until INR therapeutic?  Shekera Beavers K 12/10/2011,8:40 AM

## 2011-12-10 NOTE — Progress Notes (Signed)
Patient ID: [name], female   DOB: 1956/04/12, 56 y.o.   MRN: [id number] PATIENT DEVELOPED FEBRILE EPISODE LAST NIGHT 102-103, RR-20-24, PULSE-124, DENIED CHEST PAIN OR DISCONFORT, CHEST X-R REVEALS ATELECTASIS AND POSSIBLE BASALAR INFILTRATES, IN HOUSE DOCTOR CONSULTED AND AND FURTHER LABS ORDERED. PRESENTLY PATIENT RESTING NO  ACUTE DISTRESS, TEMP-102, ENCOURAGED TO USE SPIROMETRY Q HOUR TO RE-EXPAND  THE LUNGS. AWAIT CULTURES. CONTINUE WITH PT.

## 2011-12-11 DIAGNOSIS — J159 Unspecified bacterial pneumonia: Secondary | ICD-10-CM

## 2011-12-11 DIAGNOSIS — R509 Fever, unspecified: Secondary | ICD-10-CM

## 2011-12-11 LAB — PROTIME-INR: INR: 1.53 — ABNORMAL HIGH (ref 0.00–1.49)

## 2011-12-11 LAB — BASIC METABOLIC PANEL
BUN: 7 mg/dL (ref 6–23)
CO2: 22 mEq/L (ref 19–32)
Chloride: 96 mEq/L (ref 96–112)
Creatinine, Ser: 1.11 mg/dL — ABNORMAL HIGH (ref 0.50–1.10)
GFR calc Af Amer: 63 mL/min — ABNORMAL LOW (ref >90)
Potassium: 3.2 mEq/L — ABNORMAL LOW (ref 3.5–5.1)

## 2011-12-11 LAB — CBC
HCT: 28.9 % — ABNORMAL LOW (ref 36.0–46.0)
MCV: 88.7 fL (ref 78.0–100.0)
Platelets: 155 10*3/uL (ref 150–400)
RBC: 3.26 MIL/uL — ABNORMAL LOW (ref 3.87–5.11)
WBC: 13.9 10*3/uL — ABNORMAL HIGH (ref 4.0–10.5)

## 2011-12-11 LAB — URINE CULTURE

## 2011-12-11 MED ORDER — WARFARIN SODIUM 2 MG PO TABS
2.0000 mg | ORAL_TABLET | Freq: Once | ORAL | Status: AC
  Administered 2011-12-11: 2 mg via ORAL
  Filled 2011-12-11: qty 1

## 2011-12-11 MED ORDER — POLYETHYLENE GLYCOL 3350 17 G PO PACK
17.0000 g | PACK | Freq: Two times a day (BID) | ORAL | Status: DC
  Administered 2011-12-11 – 2011-12-12 (×3): 17 g via ORAL
  Filled 2011-12-11 (×8): qty 1

## 2011-12-11 MED ORDER — IBUPROFEN 400 MG PO TABS
400.0000 mg | ORAL_TABLET | Freq: Four times a day (QID) | ORAL | Status: DC | PRN
  Administered 2011-12-11 – 2011-12-12 (×2): 400 mg via ORAL
  Filled 2011-12-11 (×2): qty 1

## 2011-12-11 MED ORDER — OXYCODONE-ACETAMINOPHEN 5-325 MG PO TABS
1.0000 | ORAL_TABLET | ORAL | Status: DC | PRN
  Administered 2011-12-11 – 2011-12-14 (×11): 2 via ORAL
  Filled 2011-12-11 (×11): qty 2

## 2011-12-11 MED ORDER — POTASSIUM CHLORIDE CRYS ER 20 MEQ PO TBCR
40.0000 meq | EXTENDED_RELEASE_TABLET | ORAL | Status: AC
  Administered 2011-12-11 (×2): 40 meq via ORAL
  Filled 2011-12-11 (×2): qty 2

## 2011-12-11 NOTE — Progress Notes (Signed)
Subjective: LESS PAIN TODAY 5 OF 10  Objective: Vital signs in last 24 hours: Temp:  [98.2 F (36.8 C)-101.2 F (38.4 C)] 98.2 F (36.8 C) (06/08 1400) Pulse Rate:  [100-110] 102  (06/08 1400) Resp:  [16-26] 16  (06/08 1600) BP: (90-113)/(51-68) 96/60 mmHg (06/08 1400) SpO2:  [94 %-98 %] 98 % (06/08 1600)  Intake/Output from previous day: 06/07 0701 - 06/08 0700 In: 1788.8 [P.O.:720; I.V.:768.8; IV Piggyback:300] Out: 350 [Urine:350] Intake/Output this shift:     Basename 12/11/11 0530 12/10/11 0450  HGB 10.0* 11.3*    Basename 12/11/11 0530 12/10/11 0450  WBC 13.9* 14.0*  RBC 3.26* 3.57*  HCT 28.9* 32.1*  PLT 155 209    Basename 12/11/11 0530 12/10/11 0450  NA 130* 134*  K 3.2* 3.7  CL 96 98  CO2 22 20  BUN 7 6  CREATININE 1.11* 0.98  GLUCOSE 242* 183*  CALCIUM 8.3* 8.2*    Basename 12/11/11 0530 12/10/11 0450  LABPT -- --  INR 1.53* 1.23    Neurologically intactTEMP 98 DOWN FROM 102, USING SPIROMETRY WELL, DRESSING IS DRY, NO DRAINAGE.  Assessment/Plan:  S/P LEFT TOTAL HIP REVISION/ NHP  Kennieth Rad 12/11/2011, 8:14 PM

## 2011-12-11 NOTE — Progress Notes (Signed)
Subjective: Patient complaining of a lot of pain on her Left hip. No CP, no SOB, Good O2 sta on RA. Still spiking fever; but in the last 24 hours in 101 range instead of 103. Also complaining of some constipation.  Objective: Vital signs in last 24 hours: Temp:  [100.2 F (37.9 C)-101.2 F (38.4 C)] 101.2 F (38.4 C) (06/08 0549) Pulse Rate:  [100-113] 105  (06/08 0549) Resp:  [16-26] 19  (06/08 0549) BP: (99-113)/(51-71) 113/63 mmHg (06/08 0549) SpO2:  [92 %-97 %] 96 % (06/08 0549) Weight change:  Last BM Date: 12/08/11  Intake/Output from previous day: 06/07 0701 - 06/08 0700 In: 1788.8 [P.O.:720; I.V.:768.8; IV Piggyback:300] Out: 350 [Urine:350]     Physical Exam: General: Alert, awake, oriented x3, in no acute distress. HEENT: No bruits, no goiter. Heart: S1 and S2 Mild tachycardia, positive systolic murmur, No rubs or gallops. Lungs: Clear to auscultation bilaterally. Abdomen: Soft, nontender, nondistended, positive bowel sounds. Extremities: No edema; Left hip pain and decrease mobility on her left leg. Neuro: Grossly intact, nonfocal.   Lab Results: Basic Metabolic Panel:  Basename 12/11/11 0530 12/10/11 0450  NA 130* 134*  K 3.2* 3.7  CL 96 98  CO2 22 20  GLUCOSE 242* 183*  BUN 7 6  CREATININE 1.11* 0.98  CALCIUM 8.3* 8.2*  MG -- --  PHOS -- --   Liver Function Tests:  [facility] 12/10/11 0450  AST 39*  ALT 21  ALKPHOS 74  BILITOT 1.2  PROT 5.6*  ALBUMIN 2.7*   CBC:  Basename 12/11/11 0530 12/10/11 0450  WBC 13.9* 14.0*  NEUTROABS -- --  HGB 10.0* 11.3*  HCT 28.9* 32.1*  MCV 88.7 89.9  PLT 155 209   Cardiac Enzymes:  Basename 12/10/11 0450  CKTOTAL 868*  CKMB 4.2*  CKMBINDEX --  TROPONINI <0.30   Coagulation:  Basename 12/11/11 0530 12/10/11 0450  LABPROT 18.7* 15.8*  INR 1.53* 1.23   Urinalysis:  Basename 12/10/11 0501  COLORURINE AMBER*  LABSPEC 1.013  PHURINE 5.0  GLUCOSEU NEGATIVE  HGBUR MODERATE*  BILIRUBINUR SMALL*   KETONESUR NEGATIVE  PROTEINUR NEGATIVE  UROBILINOGEN 1.0  NITRITE NEGATIVE  LEUKOCYTESUR SMALL*   Misc. Labs:  Recent Results (from the past 240 hour(s))  SURGICAL PCR SCREEN     Status: Normal   Collection Time   12/07/11  1:56 PM      Component Value Range Status Comment   MRSA, PCR NEGATIVE  NEGATIVE  Final    Staphylococcus aureus NEGATIVE  NEGATIVE  Final   CULTURE, BLOOD (ROUTINE X 2)     Status: Normal (Preliminary result)   Collection Time   12/10/11  4:45 AM      Component Value Range Status Comment   Specimen Description BLOOD LEFT HAND   Final    Special Requests BOTTLES DRAWN AEROBIC AND ANAEROBIC 5CC EACH   Final    Culture  Setup Time [id number]   Final    Culture     Final    Value:        BLOOD CULTURE RECEIVED NO GROWTH TO DATE CULTURE WILL BE HELD FOR 5 DAYS BEFORE ISSUING A FINAL NEGATIVE REPORT   Report Status PENDING   Incomplete   CULTURE, BLOOD (ROUTINE X 2)     Status: Normal (Preliminary result)   Collection Time   12/10/11  4:50 AM      Component Value Range Status Comment   Specimen Description BLOOD LEFT ARM   Final  Special Requests BOTTLES DRAWN AEROBIC AND ANAEROBIC [facility]   Final    Culture  Setup Time [id number]   Final    Culture     Final    Value:        BLOOD CULTURE RECEIVED NO GROWTH TO DATE CULTURE WILL BE HELD FOR 5 DAYS BEFORE ISSUING A FINAL NEGATIVE REPORT   Report Status PENDING   Incomplete   URINE CULTURE     Status: Normal   Collection Time   12/10/11  5:01 AM      Component Value Range Status Comment   Specimen Description URINE, CATHETERIZED   Final    Special Requests NONE   Final    Culture  Setup Time [id number]   Final    Colony Count NO GROWTH   Final    Culture NO GROWTH   Final    Report Status 12/11/2011 FINAL   Final     Studies/Results: Dg Chest Port 1v Same Day  12/10/2011  *RADIOLOGY REPORT*  Clinical Data: - Shortness of breath.  PORTABLE CHEST - 1 VIEW SAME DAY  Comparison: 12/07/2011.  Findings: The  heart is mildly enlarged but stable.  The mediastinal and hilar contours are prominent but unchanged.  Very low lung volumes with vascular crowding and atelectasis.  No overt pulmonary edema or pleural effusions.  Bibasilar infiltrates are possible.  IMPRESSION: Low lung volumes with vascular crowding and atelectasis.  Cannot exclude bibasilar infiltrates.  Original Report Authenticated By: P. Loralie Champagne, M.D.    Medications: Scheduled Meds:    . carvedilol  12.5 mg Oral BID WC  . digoxin  250 mcg Oral Daily  . docusate sodium  100 mg Oral BID  . ferrous sulfate  325 mg Oral TID PC  . piperacillin-tazobactam (ZOSYN)  IV  3.375 g Intravenous Q8H  . potassium chloride  40 mEq Oral Q4H  . vancomycin  1,000 mg Intravenous Q12H  . warfarin  2 mg Oral ONCE-1800  . warfarin  2.5 mg Oral ONCE-1800  . warfarin   Does not apply Once  . Warfarin - Pharmacist Dosing Inpatient   Does not apply q1800  . DISCONTD: HYDROmorphone PCA 0.3 mg/mL   Intravenous Q4H   Continuous Infusions:    . dextrose 5 % and 0.45% NaCl 100 mL/hr at 12/11/11 0900   PRN Meds:.acetaminophen, acetaminophen, ibuprofen, menthol-cetylpyridinium, methocarbamol (ROBAXIN) IV, methocarbamol, metoCLOPramide (REGLAN) injection, metoCLOPramide, ondansetron (ZOFRAN) IV, ondansetron, oxyCODONE-acetaminophen, phenol, DISCONTD: diphenhydrAMINE, DISCONTD: diphenhydrAMINE, DISCONTD: naloxone, DISCONTD: sodium chloride  Assessment/Plan: 1-Fever: CXR with atelectasis vs bibasilar infiltrates; also UA with moderate leukocyte esterase. Fever slowly improving. Will continue empiric broad spectrum antibiotic therapy for now and follow blood cx. Urine cx w/o growth. Will add ibuprofen for better control of her fever.  2-HTN: well controlled; continue current regimen.  3-Atrial fibrillation: continue digoxin, carvedilol and also coumadin per pharmacy.  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 S/p surgical revision; apparently doing ok. Further recommendations per ortho.  5-Tachycardia: due to fever and pain; will control with antipyretics PRN and also pain meds.   6-Hypokalemia: will replete  7-Constipation: due to narcotics; will start miralax BID.    LOS: 2 days   Michel Eskelson [facility] [phone]  12/11/2011, 12:38 PM

## 2011-12-11 NOTE — Progress Notes (Signed)
Physical Therapy Treatment Patient Details Name: [name] MRN: [id number] DOB: 07-18-1955 Today's Date: 12/11/2011 Time: 4098-1191 PT Time Calculation (min): 20 min  PT Assessment / Plan / Recommendation Comments on Treatment Session  Pt requires constant cues with all mobility for safety and technique.  Still requiring +2 assist.    Follow Up Recommendations  Skilled nursing facility    Barriers to Discharge        Equipment Recommendations  Defer to next venue    Recommendations for Other Services    Frequency 7X/week   Plan Discharge plan remains appropriate;Frequency remains appropriate    Precautions / Restrictions Precautions Precautions: Posterior Hip Precaution Comments: Pt able to verbalize 2/3 precautions. Restrictions Weight Bearing Restrictions: Yes LLE Weight Bearing: Partial weight bearing LLE Partial Weight Bearing Percentage or Pounds: 50   Pertinent Vitals/Pain RN d/c'd PCA just prior to treatment and gave oral meds.  Pt c/o L hip pain but did not rate with therapy.     Mobility  Bed Mobility Bed Mobility: Supine to Sit Supine to Sit: 1: +2 Total assist;HOB elevated Supine to Sit: Patient Percentage: 50% Details for Bed Mobility Assistance: assist at trunk and for L LE management.  Max assist to scoot to eob with pad. Transfers Transfers: Sit to Stand;Stand to Sit;Stand Pivot Transfers Sit to Stand: 1: +2 Total assist;With upper extremity assist;From bed;From chair/3-in-1 Sit to Stand: Patient Percentage: 50% Stand to Sit: 1: +2 Total assist;With upper extremity assist;With armrests;To chair/3-in-1 Stand to Sit: Patient Percentage: 50% Stand Pivot Transfers: 1: +2 Total assist Stand Pivot Transfers: Patient Percentage: 40% Details for Transfer Assistance: Max cues for technique and safety.  Requires assist to advance L LE during transfer to [facility].  Slow to process. Ambulation/Gait Ambulation/Gait Assistance: 1: +2 Total assist Ambulation/Gait:  Patient Percentage: 50% Ambulation Distance (Feet): 5 Feet Assistive device: Rolling walker Ambulation/Gait Assistance Details: Required assist to advance L LE, guide RW and for safety.  Constant cues.  Attempts to sit before close enough to chair. Gait Pattern: Step-to pattern;Decreased step length - left;Decreased stance time - left;Antalgic;Trunk flexed Gait velocity: slow    Exercises Total Joint Exercises Ankle Circles/Pumps: PROM;Both;5 reps;Seated Quad Sets: AROM;Left;5 reps;Seated        PT Goals Acute Rehab PT Goals Time For Goal Achievement: 12/24/11 Potential to Achieve Goals: Good PT Goal: Supine/Side to Sit - Progress: Progressing toward goal PT Transfer Goal: Bed to Chair/Chair to Bed - Progress: Progressing toward goal PT Goal: Ambulate - Progress: Progressing toward goal PT Goal: Perform Home Exercise Program - Progress: Progressing toward goal Additional Goals PT Goal: Additional Goal #1 - Progress: Progressing toward goal  Visit Information  Last PT Received On: 12/11/11 Assistance Needed: +2    Subjective Data  Subjective: "That was better than yesterday."   Cognition  Overall Cognitive Status: Appears within functional limits for tasks assessed/performed Arousal/Alertness: Awake/alert Orientation Level: Oriented X4 / Intact Behavior During Session: [facility] for tasks performed         End of Session PT - End of Session Equipment Utilized During Treatment: Gait belt Activity Tolerance: Patient limited by fatigue;Patient limited by pain Patient left: in chair;with call bell/phone within reach Nurse Communication: Mobility status;Weight bearing status    Newell Coral 12/11/2011, 12:41 PM  Newell Coral, PTA Acute Rehab [phone] (office)

## 2011-12-11 NOTE — Progress Notes (Signed)
ANTICOAGULATION CONSULT NOTE - Follow Up Consult  Pharmacy Consult for Coumadin Indication: VTE prophylaxis - s/p L Total hip revision  Vital Signs: Temp: 101.2 F (38.4 C) (06/08 0549) BP: 113/63 mmHg (06/08 0549) Pulse Rate: 105  (06/08 0549)  Labs:  Basename 12/11/11 0530 12/10/11 0450  HGB 10.0* 11.3*  HCT 28.9* 32.1*  PLT 155 209  APTT -- --  LABPROT 18.7* 15.8*  INR 1.53* 1.23  HEPARINUNFRC -- --  CREATININE 1.11* 0.98  CKTOTAL -- 868*  CKMB -- 4.2*  TROPONINI -- <0.30    Estimated Creatinine Clearance: 55.9 ml/min (by C-G formula based on Cr of 1.11).   Assessment: 60 yof s/p left total hip revision. Patient started on warfarin for DVT prophylaxis. INR below goal, but now trending upward quickly after 2nd dose of warfarin.  Noted H/H decreased, Plts also decreased, no bleeding reported.   Per patient's previous records, she was discharged on 1mg  daily of warfarin back in 04/2009 with a therapeutic INR. Will dose conservatively at this time.  Goal of Therapy:  INR 2-3 Monitor platelets by anticoagulation protocol: Yes   Plan:  - Repeat Coumadin 2mg  PO x 1 today - Will f/up daily INR - Will monitor cx/sens, renal fn and clinical status daily. - Consider Lovenox until INR therapeutic?  Severiano Gilbert 12/11/2011,10:26 AM

## 2011-12-12 DIAGNOSIS — J159 Unspecified bacterial pneumonia: Secondary | ICD-10-CM

## 2011-12-12 DIAGNOSIS — R509 Fever, unspecified: Secondary | ICD-10-CM

## 2011-12-12 LAB — TYPE AND SCREEN
Antibody Screen: NEGATIVE
Unit division: 0

## 2011-12-12 LAB — BASIC METABOLIC PANEL
CO2: 21 mEq/L (ref 19–32)
Calcium: 8.8 mg/dL (ref 8.4–10.5)
Chloride: 103 mEq/L (ref 96–112)
Glucose, Bld: 199 mg/dL — ABNORMAL HIGH (ref 70–99)
Potassium: 3.7 mEq/L (ref 3.5–5.1)
Sodium: 135 mEq/L (ref 135–145)

## 2011-12-12 LAB — CBC
Hemoglobin: 9.9 g/dL — ABNORMAL LOW (ref 12.0–15.0)
MCH: 30.8 pg (ref 26.0–34.0)
Platelets: 146 10*3/uL — ABNORMAL LOW (ref 150–400)
RBC: 3.21 MIL/uL — ABNORMAL LOW (ref 3.87–5.11)
WBC: 9.8 10*3/uL (ref 4.0–10.5)

## 2011-12-12 LAB — VANCOMYCIN, TROUGH: Vancomycin Tr: 16.8 ug/mL (ref 10.0–20.0)

## 2011-12-12 LAB — PROTIME-INR: Prothrombin Time: 18.3 seconds — ABNORMAL HIGH (ref 11.6–15.2)

## 2011-12-12 MED ORDER — LEVOFLOXACIN 500 MG PO TABS
500.0000 mg | ORAL_TABLET | Freq: Every day | ORAL | Status: DC
  Administered 2011-12-13 – 2011-12-14 (×2): 500 mg via ORAL
  Filled 2011-12-12 (×2): qty 1

## 2011-12-12 MED ORDER — WARFARIN SODIUM 4 MG PO TABS
4.0000 mg | ORAL_TABLET | Freq: Once | ORAL | Status: AC
  Administered 2011-12-12: 4 mg via ORAL
  Filled 2011-12-12: qty 1

## 2011-12-12 NOTE — Progress Notes (Signed)
Physical Therapy Treatment Patient Details Name: [name] MRN: [id number] DOB: 06/16/1956 Today's Date: 12/12/2011 Time: 0865-7846 PT Time Calculation (min): 29 min  PT Assessment / Plan / Recommendation Comments on Treatment Session  Improved today, however remains limited by pain.  Did well with PWB LLE.    Follow Up Recommendations  Skilled nursing facility    Barriers to Discharge        Equipment Recommendations  Defer to next venue    Recommendations for Other Services    Frequency 7X/week   Plan Discharge plan remains appropriate;Frequency remains appropriate    Precautions / Restrictions Precautions Precautions: Posterior Hip Precaution Comments: Patient able to state 2/3 precautions Restrictions Weight Bearing Restrictions: Yes LLE Weight Bearing: Partial weight bearing LLE Partial Weight Bearing Percentage or Pounds: 50       Mobility  Bed Mobility Bed Mobility: Supine to Sit Supine to Sit: 4: Min assist;HOB elevated;With rails Details for Bed Mobility Assistance: Cues to maintain precautions.  Assist to initiate lifting trunk from bed Transfers Transfers: Sit to Stand;Stand to Sit;Stand Pivot Transfers Sit to Stand: 3: Mod assist;With armrests;From bed;From chair/3-in-1 Stand to Sit: 4: Min assist;With upper extremity assist;With armrests;To chair/3-in-1 Stand Pivot Transfers: 4: Min assist;With armrests Details for Transfer Assistance: Cueing for technique and safety, including hand and LLE placement. Ambulation/Gait Ambulation/Gait Assistance: 1: +2 Total assist (+1 to follow with chair for safety) Ambulation/Gait: Patient Percentage: 80% Ambulation Distance (Feet): 15 Feet Assistive device: Rolling walker Gait Pattern: Step-to pattern;Decreased step length - left;Decreased stance time - left;Antalgic Gait velocity: Slow gait speed General Gait Details: Patient able to maintain PWB - 50%.  Cues for gait sequence and safety.      PT Goals Acute  Rehab PT Goals PT Goal: Supine/Side to Sit - Progress: Progressing toward goal PT Transfer Goal: Bed to Chair/Chair to Bed - Progress: Progressing toward goal PT Goal: Ambulate - Progress: Progressing toward goal Additional Goals PT Goal: Additional Goal #1 - Progress: Progressing toward goal  Visit Information  Last PT Received On: 12/12/11 Assistance Needed: +2    Subjective Data  Subjective: "My leg hurts from my hip to my foot." Patient Stated Goal: To decrease pain   Cognition  Overall Cognitive Status: Appears within functional limits for tasks assessed/performed Arousal/Alertness: Awake/alert Orientation Level: Oriented X4 / Intact Behavior During Session: [facility] for tasks performed    Balance     End of Session PT - End of Session Equipment Utilized During Treatment: Gait belt;Left knee immobilizer Activity Tolerance: Patient limited by fatigue;Patient limited by pain Patient left: in chair;with call bell/phone within reach Nurse Communication: Mobility status    Vena Austria 12/12/2011, 12:43 PM Durenda Hurt. Renaldo Fiddler, [facility] Acute Rehab Services Pager [phone]

## 2011-12-12 NOTE — Progress Notes (Addendum)
ANTICOAGULATION/ANTIBIOTICS CONSULT NOTE - Follow Up Consult  Pharmacy Consult for  Coumadin for VTE prophylaxis - s/p L Total hip revision Vancomycin/zosyn for fever and possible UTI  Vital Signs: Temp: 98.7 F (37.1 C) (06/09 0612) BP: 102/68 mmHg (06/09 0612) Pulse Rate: 99  (06/09 0612)  Labs:  Basename 12/12/11 0620 12/11/11 0530 12/10/11 0450  HGB 9.9* 10.0* --  HCT 28.4* 28.9* 32.1*  PLT 146* 155 209  APTT -- -- --  LABPROT 18.3* 18.7* 15.8*  INR 1.49 1.53* 1.23  HEPARINUNFRC -- -- --  CREATININE 0.94 1.11* 0.98  CKTOTAL -- -- 868*  CKMB -- -- 4.2*  TROPONINI -- -- <0.30    Estimated Creatinine Clearance: 66 ml/min (by C-G formula based on Cr of 0.94).   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 Patient started on warfarin for DVT prophylaxis. INR below goal, and after upward trend has now dropped overnight. No bleeding issues noted, no drug-drug interactions noted that would cause decreased coumadin response. Will increase dose. Digoxin/coumadin - binding interactions could exists increasing levels of either medication, will continue to watch.  Noted H/H decreased, Plts also decreased slightly this appears to be stabilizing, will continue to watch  Per patient's previous records, she was discharged on 1mg  daily of warfarin back in 04/2009 with a therapeutic INR.   Fever/?UTI - day #2 vancomycin/zosyn, wbc has now trended down to within normal limits, no fevers have been noted overnight - ibuprofen added for control, last dose rec'd this morning. Scr now stable.  Vancomycin trough this after noon was 16.8, well within targeted goal. Will continue at current dose.  Abx: 6/7 Vanc>> 6/7: Zosyn>> Cx: 6/7 Blood>>ngtd 6/7 Urine>>ng  Goal of Therapy:  INR 2-3 Vancomycin trough 15-20   Plan:  - Repeat Coumadin 4 mg PO x 1 today - No changes to vancomycin and zosyn doses - Will f/up daily INR - Will monitor cx/sens, renal fn and clinical status daily. -  Consider Lovenox until INR therapeutic?  Severiano Gilbert 12/12/2011,10:25 AM

## 2011-12-12 NOTE — Progress Notes (Signed)
Subjective: 3 Days Post-Op Procedure(s) (LRB): TOTAL HIP REVISION (Left) Patient reports pain as 2 on 0-10 scale.    Objective: Vital signs in last 24 hours: Temp:  [98.7 F (37.1 C)-99 F (37.2 C)] 98.7 F (37.1 C) (06/09 0612) Pulse Rate:  [94-99] 99  (06/09 0612) Resp:  [18] 18  (06/09 0612) BP: (102-107)/(68-71) 102/68 mmHg (06/09 0612) SpO2:  [94 %-96 %] 94 % (06/09 0612)  Intake/Output from previous day: 06/08 0701 - 06/09 0700 In: 2460 [P.O.:960; I.V.:1200; IV Piggyback:300] Out: 1 [Stool:1] Intake/Output this shift:     Basename 12/12/11 0620 12/11/11 0530 12/10/11 0450  HGB 9.9* 10.0* 11.3*    Basename 12/12/11 0620 12/11/11 0530  WBC 9.8 13.9*  RBC 3.21* 3.26*  HCT 28.4* 28.9*  PLT 146* 155    Basename 12/12/11 0620 12/11/11 0530  NA 135 130*  K 3.7 3.2*  CL 103 96  CO2 21 22  BUN 7 7  CREATININE 0.94 1.11*  GLUCOSE 199* 242*  CALCIUM 8.8 8.3*    Basename 12/12/11 0620 12/11/11 0530  LABPT -- --  INR 1.49 1.53*    Neurologically intact  Assessment/Plan: 3 Days Post-Op Procedure(s) (LRB): TOTAL HIP REVISION (Left) Plan for discharge tomorrowDISCHARGE TO NH IN AM.  Myrtie Neither F 12/12/2011, 7:12 PM

## 2011-12-12 NOTE — Progress Notes (Signed)
Subjective: Patient complaining of pain on her hip only with movement now. No further fever since yesterday. Denies CP or cough. Feeling better overall.  Objective: Vital signs in last 24 hours: Temp:  [98.2 F (36.8 C)-99 F (37.2 C)] 98.7 F (37.1 C) (06/09 0612) Pulse Rate:  [94-102] 99  (06/09 0612) Resp:  [16-20] 18  (06/09 0612) BP: (96-107)/(60-71) 102/68 mmHg (06/09 0612) SpO2:  [94 %-98 %] 94 % (06/09 0612) Weight change:  Last BM Date: 12/10/11  Intake/Output from previous day: 06/08 0701 - 06/09 0700 In: 2460 [P.O.:960; I.V.:1200; IV Piggyback:300] Out: 1 [Stool:1] Total I/O In: 240 [P.O.:240] Out: -    Physical Exam: General: Alert, awake, oriented x3, in no acute distress. HEENT: No bruits, no goiter. Heart: S1 and S2,  Mild tachycardia, positive systolic murmur, No rubs or gallops. Lungs: Clear to auscultation bilaterally. Abdomen: Soft, nontender, nondistended, positive bowel sounds. Extremities: No edema; Left hip pain and decrease mobility on her left leg. Neuro: Grossly intact, nonfocal.   Lab Results: Basic Metabolic Panel:  Basename 12/12/11 0620 12/11/11 0530  NA 135 130*  K 3.7 3.2*  CL 103 96  CO2 21 22  GLUCOSE 199* 242*  BUN 7 7  CREATININE 0.94 1.11*  CALCIUM 8.8 8.3*  MG -- --  PHOS -- --   Liver Function Tests:  [facility] 12/10/11 0450  AST 39*  ALT 21  ALKPHOS 74  BILITOT 1.2  PROT 5.6*  ALBUMIN 2.7*   CBC:  Basename 12/12/11 0620 12/11/11 0530  WBC 9.8 13.9*  NEUTROABS -- --  HGB 9.9* 10.0*  HCT 28.4* 28.9*  MCV 88.5 88.7  PLT 146* 155   Cardiac Enzymes:  Basename 12/10/11 0450  CKTOTAL 868*  CKMB 4.2*  CKMBINDEX --  TROPONINI <0.30   Coagulation:  Basename 12/12/11 0620 12/11/11 0530  LABPROT 18.3* 18.7*  INR 1.49 1.53*   Urinalysis:  Basename 12/10/11 0501  COLORURINE AMBER*  LABSPEC 1.013  PHURINE 5.0  GLUCOSEU NEGATIVE  HGBUR MODERATE*  BILIRUBINUR SMALL*  KETONESUR NEGATIVE  PROTEINUR  NEGATIVE  UROBILINOGEN 1.0  NITRITE NEGATIVE  LEUKOCYTESUR SMALL*   Misc. Labs:  Recent Results (from the past 240 hour(s))  SURGICAL PCR SCREEN     Status: Normal   Collection Time   12/07/11  1:56 PM      Component Value Range Status Comment   MRSA, PCR NEGATIVE  NEGATIVE  Final    Staphylococcus aureus NEGATIVE  NEGATIVE  Final   CULTURE, BLOOD (ROUTINE X 2)     Status: Normal (Preliminary result)   Collection Time   12/10/11  4:45 AM      Component Value Range Status Comment   Specimen Description BLOOD LEFT HAND   Final    Special Requests BOTTLES DRAWN AEROBIC AND ANAEROBIC 5CC EACH   Final    Culture  Setup Time [id number]   Final    Culture     Final    Value:        BLOOD CULTURE RECEIVED NO GROWTH TO DATE CULTURE WILL BE HELD FOR 5 DAYS BEFORE ISSUING A FINAL NEGATIVE REPORT   Report Status PENDING   Incomplete   CULTURE, BLOOD (ROUTINE X 2)     Status: Normal (Preliminary result)   Collection Time   12/10/11  4:50 AM      Component Value Range Status Comment   Specimen Description BLOOD LEFT ARM   Final    Special Requests BOTTLES DRAWN AEROBIC AND ANAEROBIC 5CC EACH  Final    Culture  Setup Time [id number]   Final    Culture     Final    Value:        BLOOD CULTURE RECEIVED NO GROWTH TO DATE CULTURE WILL BE HELD FOR 5 DAYS BEFORE ISSUING A FINAL NEGATIVE REPORT   Report Status PENDING   Incomplete   URINE CULTURE     Status: Normal   Collection Time   12/10/11  5:01 AM      Component Value Range Status Comment   Specimen Description URINE, CATHETERIZED   Final    Special Requests NONE   Final    Culture  Setup Time [id number]   Final    Colony Count NO GROWTH   Final    Culture NO GROWTH   Final    Report Status 12/11/2011 FINAL   Final     Studies/Results: No results found.  Medications: Scheduled Meds:    . carvedilol  12.5 mg Oral BID WC  . digoxin  250 mcg Oral Daily  . docusate sodium  100 mg Oral BID  . ferrous sulfate  325 mg Oral TID PC  .  levofloxacin  500 mg Oral Daily  . piperacillin-tazobactam (ZOSYN)  IV  3.375 g Intravenous Q8H  . polyethylene glycol  17 g Oral BID  . potassium chloride  40 mEq Oral Q4H  . vancomycin  1,000 mg Intravenous Q12H  . warfarin  2 mg Oral ONCE-1800  . warfarin  4 mg Oral ONCE-1800  . Warfarin - Pharmacist Dosing Inpatient   Does not apply q1800  . DISCONTD: HYDROmorphone PCA 0.3 mg/mL   Intravenous Q4H   Continuous Infusions:    . dextrose 5 % and 0.45% NaCl 100 mL/hr at 12/12/11 0309   PRN Meds:.acetaminophen, acetaminophen, ibuprofen, menthol-cetylpyridinium, methocarbamol (ROBAXIN) IV, methocarbamol, metoCLOPramide (REGLAN) injection, metoCLOPramide, ondansetron (ZOFRAN) IV, ondansetron, oxyCODONE-acetaminophen, phenol, DISCONTD: diphenhydrAMINE, DISCONTD: diphenhydrAMINE, DISCONTD: naloxone, DISCONTD: sodium chloride  Assessment/Plan: 1-Fever: CXR with atelectasis vs bibasilar infiltrates; also UA with moderate leukocyte esterase. Now afebrile. Blood and UA cx's negative up to date. Will treat to complete 8 days total of antibiotics for possible PNA; will use 3 days of IV antibiotics and transition to levaquin for 5 more days. Continue  PRN antipyretics.  2-HTN: well controlled; continue current regimen.  3-Atrial fibrillation: continue digoxin, carvedilol and also coumadin per pharmacy.  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 S/p surgical revision; apparently doing ok. Further recommendations per ortho.  5-Tachycardia: well controlled now. Continue PRN antipyretics and pain meds.   6-Hypokalemia: repleted.  7-Constipation: due to narcotics; continue miralax BID.  At this point continue therapy as outlined above and call us with any question; her fever appears to be 2/2 PNA. Will sign off. Thanks for the consult and allowing to participate in Ms Toppers care.    LOS: 3 days    Calton Harshfield [facility] [phone]  12/12/2011, 10:47 AM

## 2011-12-13 LAB — PROTIME-INR
INR: 1.6 — ABNORMAL HIGH (ref 0.00–1.49)
Prothrombin Time: 19.3 seconds — ABNORMAL HIGH (ref 11.6–15.2)

## 2011-12-13 MED ORDER — WARFARIN SODIUM 4 MG PO TABS
4.0000 mg | ORAL_TABLET | Freq: Once | ORAL | Status: DC
  Filled 2011-12-13: qty 1

## 2011-12-13 MED ORDER — OXYCODONE-ACETAMINOPHEN 5-325 MG PO TABS: 1.0000 | ORAL_TABLET | ORAL | Status: AC | PRN

## 2011-12-13 MED ORDER — DIPHENHYDRAMINE HCL 25 MG PO CAPS
25.0000 mg | ORAL_CAPSULE | ORAL | Status: DC | PRN
  Administered 2011-12-13: 25 mg via ORAL
  Filled 2011-12-13: qty 1

## 2011-12-13 NOTE — Progress Notes (Signed)
Utilization review completed. Candice Tobey, RN, BSN. 12/13/11 

## 2011-12-13 NOTE — Progress Notes (Signed)
Per MD- patient is medically stable for d/c. CSW has discussed with patient- has one current option for placement in [place]. Patient declines bed offer there stating it is "too far."  Discussed medical stability and need to accept available bed or plan d/c home with HH/DME and family support. Patient relates that she does not have any family or friends to help her in this area. Spoke with [facility]- they will "consider" patient IF she agrees to remain in facility at least 30 days due to [facility] issues.  Facility will notify CSW in a.m. If they can offer a bed. Patient is aware.    [name]. [name]  [phone]

## 2011-12-13 NOTE — Discharge Summary (Signed)
NAMEHADASA, [name] NO.:  [id number]  MEDICAL RECORD NO.:  [id number]  LOCATION:  5005                         FACILITY:  [facility]  PHYSICIAN:  Myrtie Neither, MD      DATE OF BIRTH:  10/16/1955  DATE OF ADMISSION:  12/09/2011 DATE OF DISCHARGE:  12/13/2011                              DISCHARGE SUMMARY   ADMITTING DIAGNOSES: 1. Painful loose left total hip. 2. History of hypertension. 3. History of myocardial infarction. 4. History of heart murmur. 5. History of pneumonia. 6. History of dysrhythmia. 7. Gastroesophageal reflux disease. 8. Degenerative joint disease.  COMPLICATIONS:  Febrile episode, 102 to 103, secondary to atelectasis.  INFECTIONS:  Urinary tract infection.  OPERATION:  Left total hip revision, femoral stem, Biomet implant.  PERTINENT HISTORY:  This is a 56 year old female who had left total hip arthroplasty several years ago and most recently, over this past year, had left thigh and hip pain, which progressively worsened.  The patient had CT scan, which demonstrated loosening around the stem of the femoral component.  Acetabular component was intact.  The patient is pain controlled with anti-inflammatory and pain medication and with use of a cane.  PHYSICAL EXAMINATION:  Pertinent physical was that of left hip, tender anteriorly, mid thigh pain on rotation and resisted hip extension and hip abduction.  Neurovascular status was intact.  IMAGING:  CT scan revealed loosening of femoral stem of the left total hip.  HOSPITAL COURSE:  The patient underwent preop laboratory, CBC, EKG, chest x-ray, PT, PTT, platelet count, UA.  The patient's labs were stable enough to undergo surgery.  The patient's labs did reveal urinary tract infection and with a white cell count of 12,000.  The patient underwent left total hip revision, tolerated the procedure quite well. Postop course, had febrile episode of 102 to 103.  Chest x-ray revealed basilar  atelectasis.  Blood cultures were negative.  The patient was seen by in-house hospitalist.  The patient was placed on Zosyn along with vancomycin which she was already on prophylactically.  The patient's temperature came down to 98.6.  White cell count was back down to normal.  The patient has done very well with physical therapy and recently partial weightbearing, 50% on the left side.  The pain is under control with the use of Percocet two q.4 p.r.n. for pain.  The patient has dry dressing changes to the left hip.  MEDICATIONS:  The patient's medications at this point are: 1. Tylenol two q.4 p.r.n. for elevated temperature. 2. Coreg 12.5 mg 2 tablets daily. 3. Flexeril 10 mg b.i.d. 4. Lanoxin 0.25 mg daily. 5. Colace 100 mg b.i.d. 6. Ferrous sulfate 325 mg t.i.d. 7. Percocet two q.4 p.r.n. for pain. 8. Coumadin 4 mg daily.  DISCHARGE INSTRUCTIONS:  Weekly INRs, daily dressing changes of left hip, physical therapy, 50% weightbearing on the left side.  The patient is to return to the office in 1 week.  CONDITION ON DISCHARGE:  The patient is being discharged to nursing home in stable and satisfactory condition.     Myrtie Neither, MD     AC/MEDQ  D:  12/13/2011  T:  12/13/2011  Job:  [id number]

## 2011-12-13 NOTE — Progress Notes (Signed)
ANTICOAGULATION CONSULT NOTE - Follow Up Consult  Pharmacy Consult for Warfarin Indication: VTE px s/p L THR on 6/6  Allergies  Allergen Reactions  . Penicillins Itching    Patient Measurements: Height: 5' 5.5" (166.4 cm) Weight: 156 lb 4.9 oz (70.9 kg) IBW/kg (Calculated) : 58.15    Vital Signs: Temp: 98.8 F (37.1 C) (06/10 0629) BP: 117/74 mmHg (06/10 0629) Pulse Rate: 92  (06/10 0629)  Labs:  Basename 12/13/11 0640 12/12/11 0620 12/11/11 0530  HGB -- 9.9* 10.0*  HCT -- 28.4* 28.9*  PLT -- 146* 155  APTT -- -- --  LABPROT 19.3* 18.3* 18.7*  INR 1.60* 1.49 1.53*  HEPARINUNFRC -- -- --  CREATININE -- 0.94 1.11*  CKTOTAL -- -- --  CKMB -- -- --  TROPONINI -- -- --    Estimated Creatinine Clearance: 66 ml/min (by C-G formula based on Cr of 0.94).   Assessment: 56 y.o. F on warfarin for VTE px s/p L THR on 6/6 with a SUBtherapeutic INR this a.m though trending up (INR 1.6 << 1.49). No CBC drawn this a.m -- appears stable from labs drawn on 6/9. No s/sx of bleeding noted at this time. The patient was educated on warfarin this admission.   Noted plans for discharge today. Pharmacy has been dosing conservatively given the patient's history of previously being on warfarin and therapeutic on a dose of 1 mg daily back in Oct' 2010. If discharged today -- would recommend 4 mg x 1 dose today followed by 3 mg daily until INR can be rechecked (no later than Friday, 6/14)  Goal of Therapy:  INR 2-3 Monitor platelets by anticoagulation protocol: Yes   Plan:  1. Warfarin 4 mg x 1 dose at 1800 today 2. Discharge recommendations noted above 3. Will continue to monitor for any signs/symptoms of bleeding and will follow up with PT/INR in the a.m.   Georgina Pillion, PharmD, BCPS Clinical Pharmacist Pager: [phone] 12/13/2011 8:43 AM

## 2011-12-13 NOTE — Progress Notes (Deleted)
OT SCREEN  OT order received. Chart reviewed. Pt plans to go to SNF for rehab. Defer OT to SNF. Thanks for referral.  Luisa Dago, OTR/L  [phone] 12/13/2011

## 2011-12-13 NOTE — Clinical Social Work Psychosocial (Addendum)
    Clinical Social Work Department BRIEF PSYCHOSOCIAL ASSESSMENT 12/13/2011  Patient:  [name], [name]     Account Number:  [id number]     Admit date:  12/09/2011  Clinical Social Worker:  Skip Mayer  Date/Time:  12/12/2011 04:20 PM  Referred by:  Physician  Date Referred:  12/10/2011 Referred for  SNF Placement   Other Referral:   Interview type:  Patient Other interview type:    PSYCHOSOCIAL DATA Living Status:  ALONE Admitted from facility:   Level of care:   Primary support name:  Thelma Primary support relationship to patient:  PARENT Degree of support available:   Adequate, per pt    CURRENT CONCERNS Current Concerns  Post-Acute Placement   Other Concerns:    SOCIAL WORK ASSESSMENT / PLAN CSW met with pt re: PT recommendation for SNF. CSW explained placement process and answered questions. Pt reports negative experience at [facility] in past and refuses this SNF. CSW explained potential barriers to SNF placement due to [facility] payor source. CSW initiated SNF search. Fl2 on chart. Weekday CSW to f/u with offers.   Assessment/plan status:  Information/Referral to [facility] Other assessment/ plan:   Information/referral to community resources:   SNF    PATIENT'S/FAMILY'S RESPONSE TO PLAN OF CARE: Pt reports agreeable to SNF in order to increase strenght and independence with mobility prior to return home. Pt reports [facility] not an option as she has had negative experience in past.

## 2011-12-13 NOTE — Progress Notes (Signed)
Physical Therapy Treatment Note   12/13/11 1105  PT Visit Information  Last PT Received On 12/13/11  Assistance Needed +1  PT Time Calculation  PT Start Time 1105  PT Stop Time 1127  PT Time Calculation (min) 22 min  Subjective Data  Subjective Pt received supine in bed. Per RN tech patient was up for a short period of time this AM.  Precautions  Precautions Posterior Hip  Precaution Comments Pt unable to state hip precautions  Restrictions  LLE Weight Bearing PWB  LLE Partial Weight Bearing Percentage or Pounds 50  Cognition  Overall Cognitive Status Appears within functional limits for tasks assessed/performed  Arousal/Alertness Awake/alert  Orientation Level Oriented X4 / Intact  Behavior During Session [facility] for tasks performed  Bed Mobility  Bed Mobility Supine to Sit  Supine to Sit 4: Min assist;HOB elevated;With rails  Details for Bed Mobility Assistance cues to maintain precautions, pt tends to IR L LE  Transfers  Transfers Sit to Stand;Stand to Sit  Sit to Stand 4: Min assist  Stand to Sit 4: Min assist  Details for Transfer Assistance v/c's for hand placement and L LE management.  Ambulation/Gait  Ambulation/Gait Assistance 4: Min assist  Assistive device Rolling walker  Ambulation/Gait Assistance Details pt appears to be adhereing to L LE PWB. Patient with freq standing rest breaks due to pain and fatigue. encouraged increased bilat UE weight-bearing.  Gait Pattern Step-to pattern;Decreased step length - left;Decreased stance time - left;Antalgic  Gait velocity Slow gait speed  General Gait Details max encouragement required to increase ambulation distance.  Exercises  Exercises Total Joint  Total Joint Exercises  Ankle Circles/Pumps AROM;Both;10 reps;Seated  Gluteal Sets AROM;Both;Seated;15 reps  Long [employer] AROM;10 reps;Seated;Left  PT - End of Session  Equipment Utilized During Treatment Gait belt  Activity Tolerance Patient limited by fatigue;Patient  limited by pain  Patient left in chair;with call bell/phone within reach (RN tech present to assist pt to [facility])  Nurse Communication Mobility status  PT - Assessment/Plan  Comments on Treatment Session pt slowly progressing towards all goals. patient with con't to require v/c's for L post hip precautions and max encouragement for OOB mobility. Patient remains appropriate for SNF placement due to no assist at home and [address].  PT Plan Discharge plan remains appropriate;Frequency remains appropriate  PT Frequency 7X/week  Follow Up Recommendations Skilled nursing facility  Equipment Recommended Defer to next venue  Acute Rehab PT Goals  Time For Goal Achievement 12/24/11  Potential to Achieve Goals Good  PT Goal: Supine/Side to Sit - Progress Progressing toward goal  PT Transfer Goal: Bed to Chair/Chair to Bed - Progress Progressing toward goal  PT Goal: Ambulate - Progress Progressing toward goal  PT Goal: Perform Home Exercise Program - Progress Progressing toward goal  Additional Goals  PT Goal: Additional Goal #1 - Progress Progressing toward goal  PT General Charges  $$ ACUTE PT VISIT 1 Procedure  PT Treatments  $Gait Training 8-22 mins     Pain: pt did not rate but con't to report L hip pain with mobility t/o PT session.  Lewis Shock, PT, DPT Pager #: [phone] Office #: [phone]

## 2011-12-13 NOTE — Clinical Social Work Placement (Addendum)
    Clinical Social Work Department CLINICAL SOCIAL WORK PLACEMENT NOTE 12/13/2011  Patient:  [name], [name]  Account Number:  [id number] Admit date:  12/09/2011  Clinical Social Worker:  Skip Mayer  Date/time:  12/12/2011 04:30 PM  Clinical Social Work is seeking post-discharge placement for this patient at the following level of care:   SKILLED NURSING   (*CSW will update this form in Epic as items are completed)   12/12/2011  Patient/family provided with [facility] Department of Clinical Social Work's list of facilities offering this level of care within the geographic area requested by the patient (or if unable, by the patient's family).  12/12/2011  Patient/family informed of their freedom to choose among providers that offer the needed level of care, that participate in Medicare, Medicaid or managed care program needed by the patient, have an available bed and are willing to accept the patient.  12/12/2011  Patient/family informed of [facility]' ownership interest in [facility], as well as of the fact that they are under no obligation to receive care at this facility.  PASARR submitted to EDS on 12/12/2011 PASARR number received from EDS on 12/12/2011  FL2 transmitted to all facilities in geographic area requested by pt/family on  12/12/2011 FL2 transmitted to all facilities within larger geographic area on NA  Patient informed that his/her managed care company has contracts with or will negotiate with  certain facilities, including the following:   Patient has Medicaid only   Patient/family informed of bed offers received:  12/13/11 and 12/14/11 Patient chooses bed at [facility] Physician recommends and patient chooses bed at  NA   Patient to be transferred to [facility] on 12/14/11   Patient to be transferred to facility by  [facility]  The following physician request were entered in Epic:   Additional Comments:Patient is  agreeable to SNF placement but was hoping for placement in [place]. She plans to remain at SNF for at least 30 days to receive maximum benefit of physical therapy.  Notified SNF and nursing of d/c plan. Requested prior approval for SNF medicaid per EDS. Will forward auth number to facility and DSS once obtained.    Lorri Frederick. [name]  [phone]

## 2011-12-14 LAB — PROTIME-INR: Prothrombin Time: 20.1 seconds — ABNORMAL HIGH (ref 11.6–15.2)

## 2011-12-14 NOTE — Progress Notes (Signed)
PT Progress Note:     12/14/11 1400  PT Visit Information  Last PT Received On 12/14/11  Assistance Needed +1  PT Time Calculation  PT Start Time 0805  PT Stop Time 0829  PT Time Calculation (min) 24 min  Precautions  Precautions Posterior Hip  Precaution Comments Reviewed all 3 hip precautions.    Restrictions  Weight Bearing Restrictions Yes  LLE Weight Bearing PWB  LLE Partial Weight Bearing Percentage or Pounds 50  Cognition  Overall Cognitive Status Appears within functional limits for tasks assessed/performed  Arousal/Alertness Awake/alert  Orientation Level Oriented X4 / Intact  Behavior During Session [facility] for tasks performed  Bed Mobility  Bed Mobility Supine to Sit;Sitting - Scoot to Edge of Bed  Supine to Sit HOB flat;4: Min assist  Sitting - Scoot to [place] of Bed 4: Min guard  Details for Bed Mobility Assistance Cues to reinforce hip precautions & cues for hand placement/use to increase ease of transition.   (A) for LLE.    Transfers  Transfers Sit to Stand;Stand to Sit  Sit to Stand 4: Min assist;With upper extremity assist;From bed  Stand to Sit 4: Min guard;With upper extremity assist;With armrests;To chair/3-in-1  Details for Transfer Assistance Cues for safest hand placement, reinforcement of hip precaution, & technique.    Ambulation/Gait  Ambulation/Gait Assistance 4: Min guard  Ambulation Distance (Feet) 60 Feet  Assistive device Rolling walker  Ambulation/Gait Assistance Details Cues for sequencing, tall posture, reinforcement of 50% PWBing & use of UE's to maintain weight-bearing restriction.   Gait Pattern Step-to pattern;Decreased stance time - left;Decreased weight shift to left;Decreased step length - right  Stairs No  Wheelchair Mobility  Wheelchair Mobility No  Balance  Balance Assessed No  Exercises  Exercises Total Joint  Total Joint Exercises  Ankle Circles/Pumps AROM;Both;15 reps  Gluteal Sets AROM;Both;15 reps  Heel Slides  AAROM;Left;Other reps (comment) (12 reps)  Hip ABduction/ADduction AAROM;Left;Other reps (comment) (12 reps)  Marching in Standing Both;10 reps;Standing  PT - End of Session  Equipment Utilized During Treatment Gait belt  Activity Tolerance Patient tolerated treatment well  Patient left in chair;with call bell/phone within reach  PT - Assessment/Plan  PT Plan Discharge plan remains appropriate;Frequency remains appropriate  PT Frequency 7X/week  Follow Up Recommendations Skilled nursing facility  Equipment Recommended Defer to next venue  Acute Rehab PT Goals  Time For Goal Achievement 12/24/11  Potential to Achieve Goals Good  PT Goal: Supine/Side to Sit - Progress Not met  PT Goal: Ambulate - Progress Progressing toward goal  PT Goal: Perform Home Exercise Program - Progress Progressing toward goal  Additional Goals  PT Goal: Additional Goal #1 - Progress Progressing toward goal  PT General Charges  $$ ACUTE PT VISIT 1 Procedure  PT Treatments  $Gait Training 8-22 mins  $Therapeutic Exercise 8-22 mins      Verdell Face, [place] [phone] 12/14/2011

## 2011-12-16 LAB — CULTURE, BLOOD (ROUTINE X 2)
Culture  Setup Time: 201306070821
Culture  Setup Time: 201306070821
Culture: NO GROWTH

## 2012-02-07 ENCOUNTER — Ambulatory Visit: Payer: Medicaid Other | Attending: Orthopedic Surgery | Admitting: Rehabilitative and Restorative Service Providers"

## 2012-02-07 DIAGNOSIS — IMO0001 Reserved for inherently not codable concepts without codable children: Secondary | ICD-10-CM | POA: Insufficient documentation

## 2012-02-07 DIAGNOSIS — M6281 Muscle weakness (generalized): Secondary | ICD-10-CM | POA: Insufficient documentation

## 2012-02-07 DIAGNOSIS — M25559 Pain in unspecified hip: Secondary | ICD-10-CM | POA: Insufficient documentation

## 2012-02-17 ENCOUNTER — Ambulatory Visit: Payer: Medicaid Other | Admitting: Rehabilitation

## 2012-02-21 ENCOUNTER — Encounter: Payer: Medicaid Other | Admitting: Rehabilitation

## 2012-02-28 ENCOUNTER — Ambulatory Visit: Payer: Medicaid Other | Admitting: Rehabilitation

## 2012-03-14 ENCOUNTER — Ambulatory Visit: Payer: Medicaid Other | Attending: Orthopedic Surgery | Admitting: Rehabilitative and Restorative Service Providers"

## 2012-03-14 ENCOUNTER — Ambulatory Visit: Payer: Medicaid Other | Admitting: Physical Therapy

## 2012-03-14 DIAGNOSIS — M25559 Pain in unspecified hip: Secondary | ICD-10-CM | POA: Insufficient documentation

## 2012-03-14 DIAGNOSIS — IMO0001 Reserved for inherently not codable concepts without codable children: Secondary | ICD-10-CM | POA: Insufficient documentation

## 2012-03-14 DIAGNOSIS — M6281 Muscle weakness (generalized): Secondary | ICD-10-CM | POA: Insufficient documentation

## 2012-04-03 IMAGING — CR DG HIP (WITH OR WITHOUT PELVIS) 2-3V*L*
2 series · 2 of 2 positions shown · non-contrast
Comparison: Left hip films of 10/30/2009

CLINICAL DATA: Left hip surgery 9848, pain in the left hip

LEFT HIP - COMPLETE 2+ VIEW

[t hip ap left]
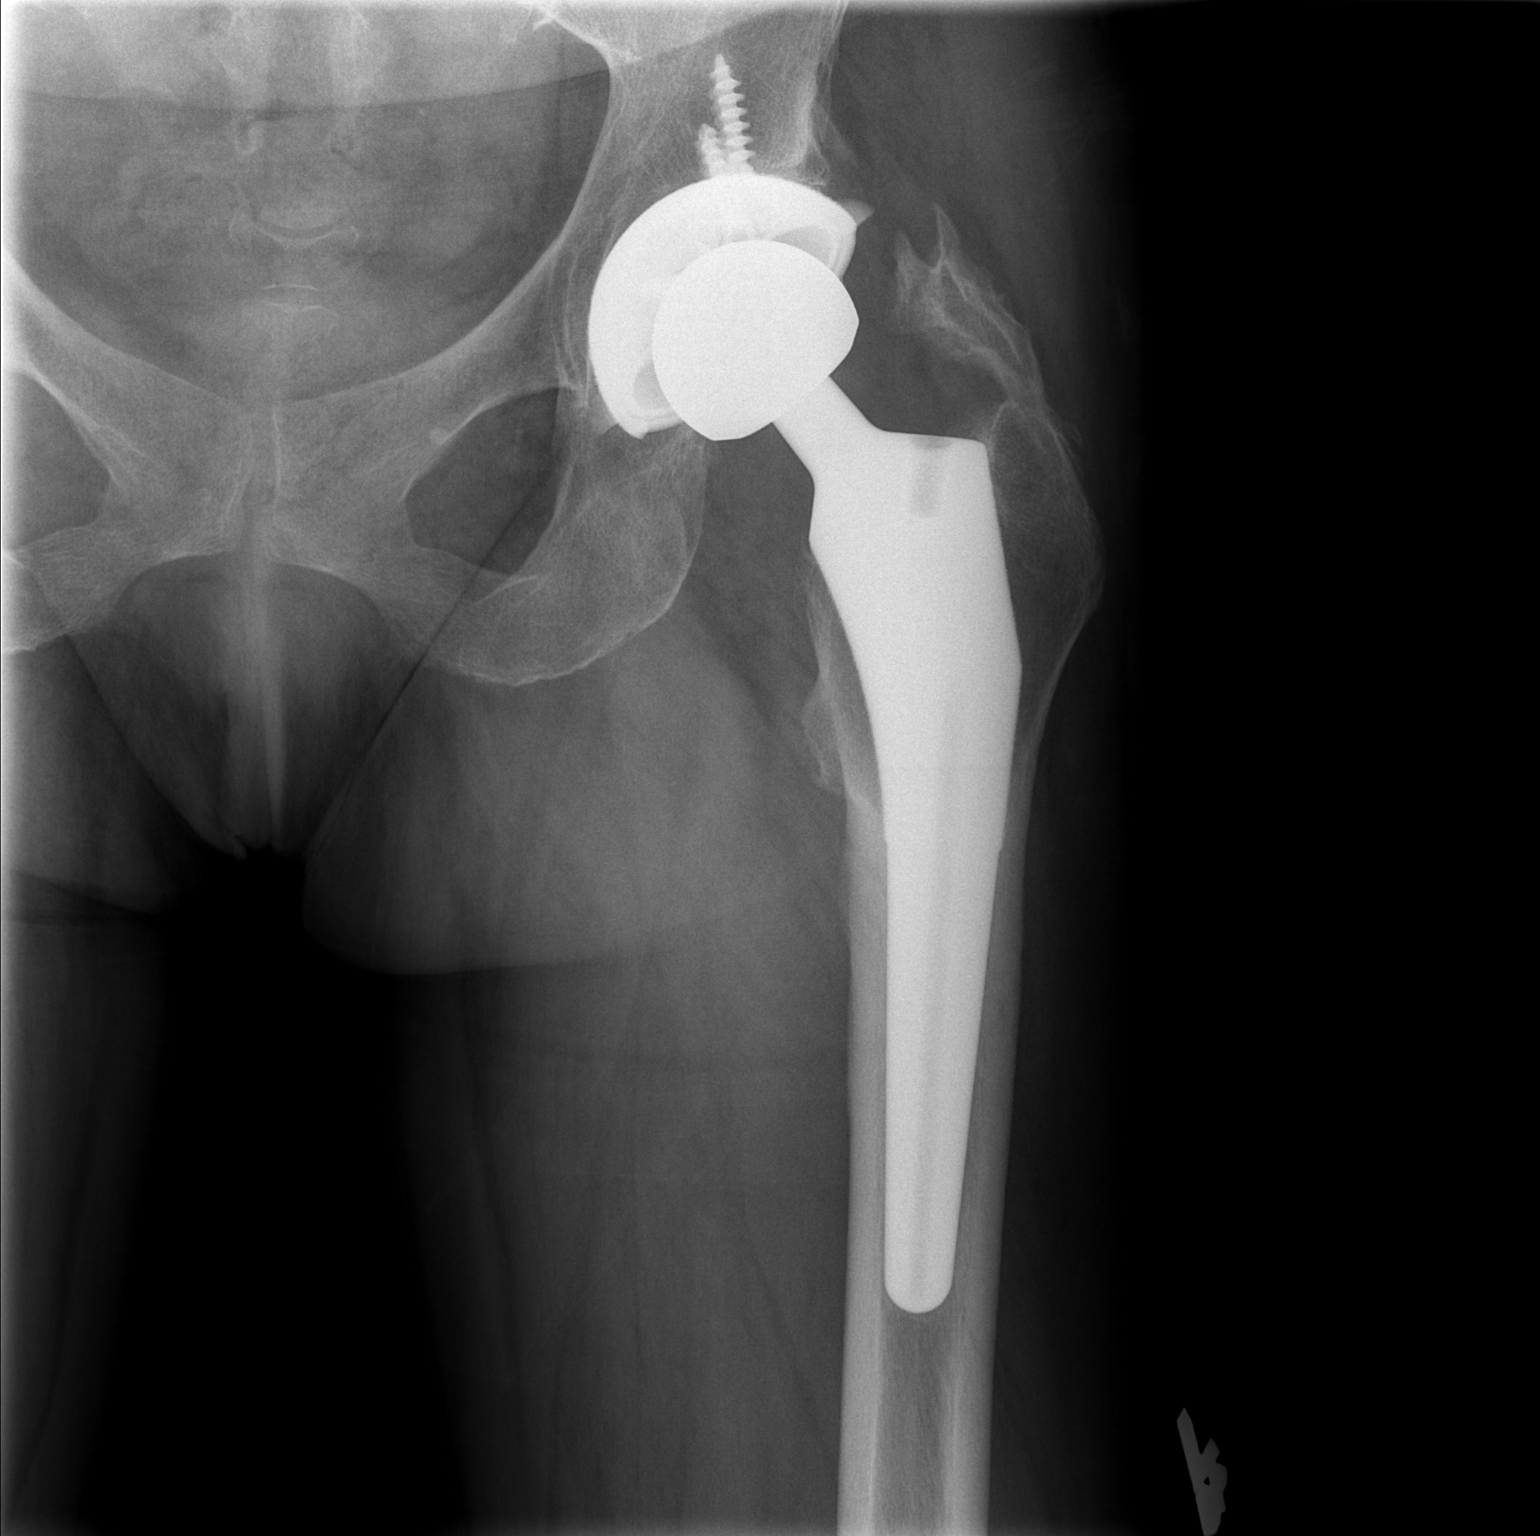

[t hip frog leg left]
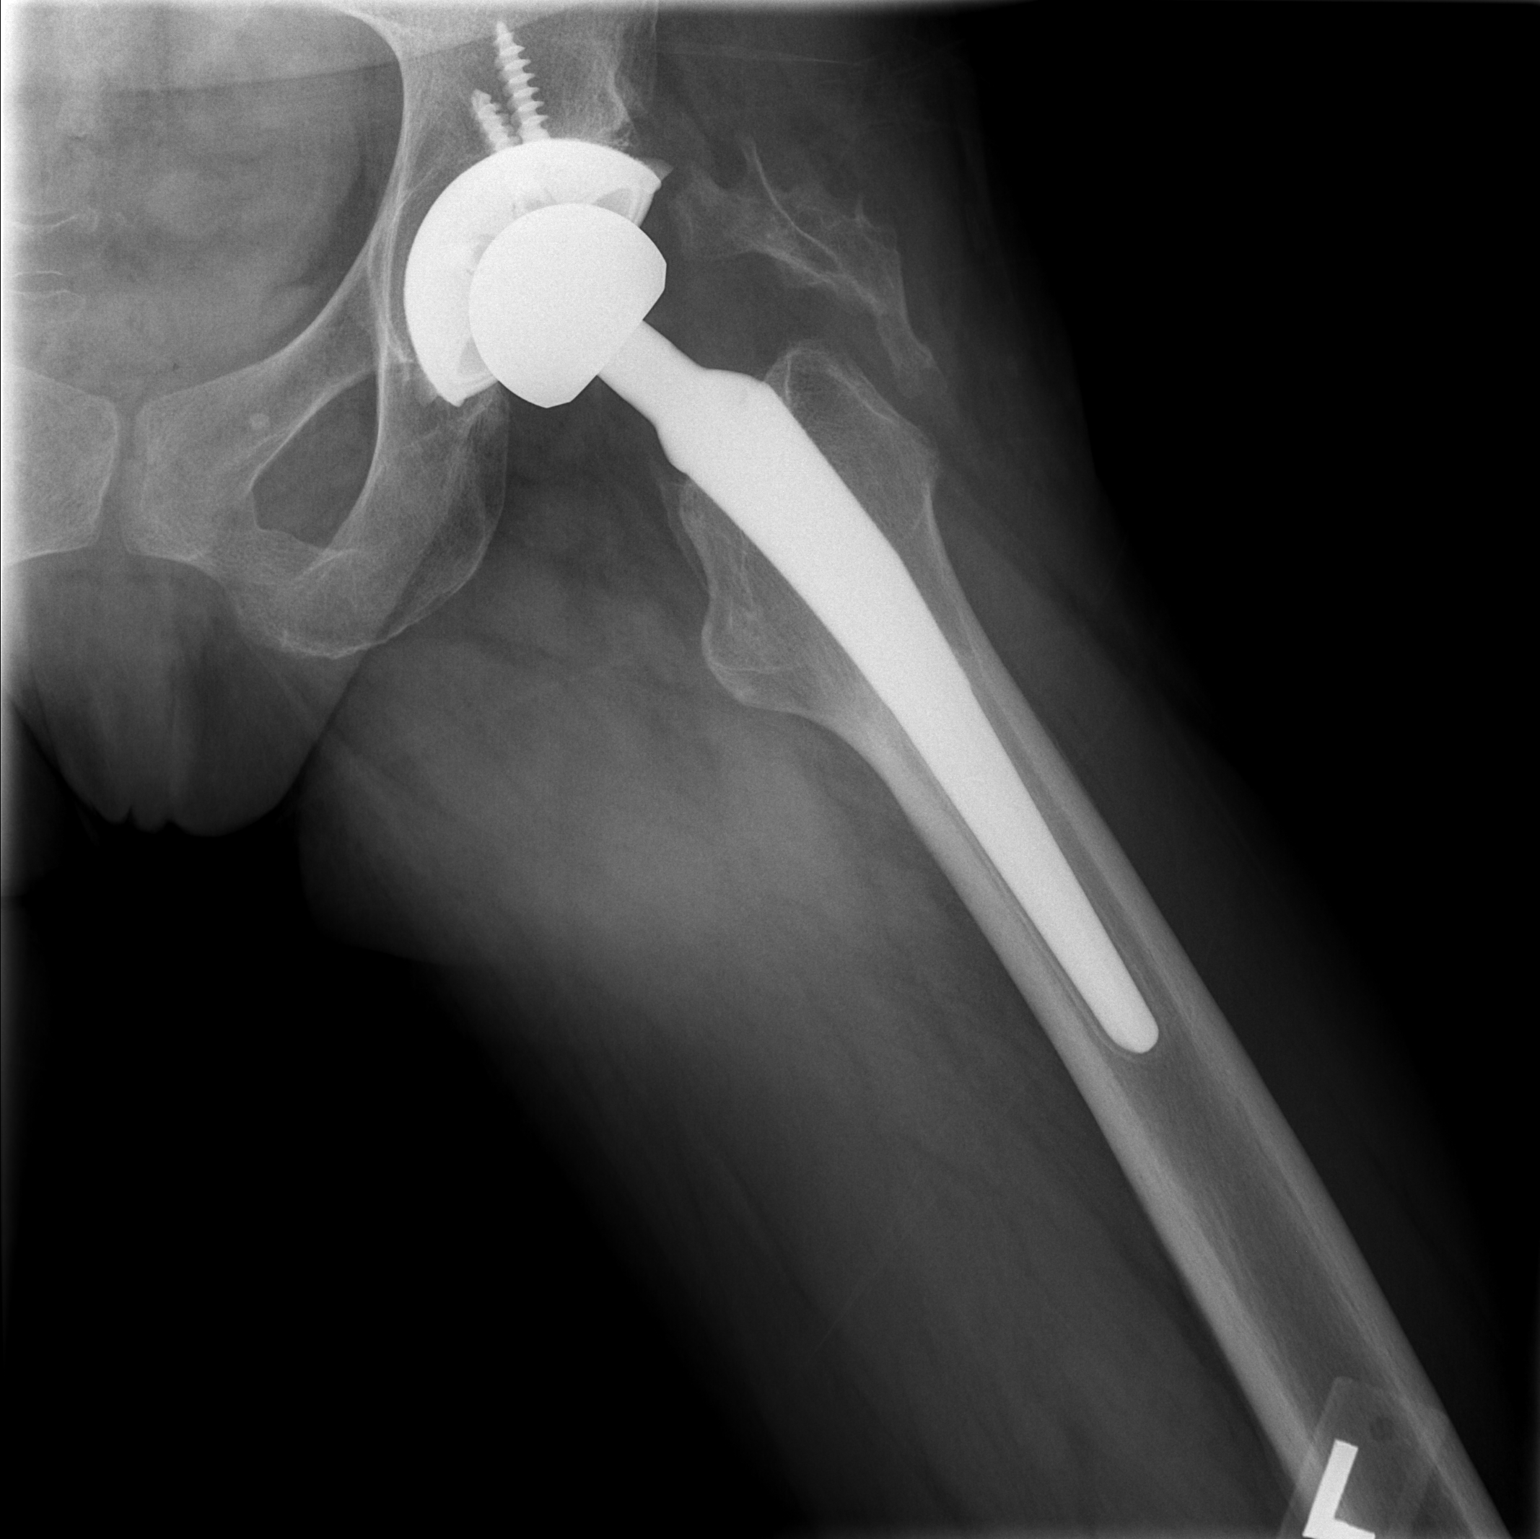

[2 of 2 positions shown; findings below may reference images not displayed]

FINDINGS: There is some lucency around the tip of the femoral stem
of the left total hip replacement.  This may indicate prosthetic
loosening.  Three-phase bone scan may be helpful to assess further.
The acetabular portion of the total hip replacement appears stable
in position with no complicating features.  Hypertrophic bone is
noted just above the left greater trochanter laterally.
IMPRESSION: Some lucency does surround the stem of the femoral component which
may indicate prosthetic loosening.  Consider three-phase bone scan
to assess further.

## 2012-04-17 IMAGING — NM NM BONE 3 PHASE
1 series · 12 of 12 positions shown · non-contrast
Comparison: Radiographs dated 01/21/2011 and 10/22/2009

CLINICAL DATA: Left hip pain.  Prior left total hip prosthesis
insertion.

NUCLEAR MEDICINE THREE PHASE BONE SCAN
TECHNIQUE: Radionuclide angiographic images, immediate static
blood pool images, and 3-hour delayed static images were obtained
after intravenous injection of radiopharmaceutical.
Radiopharmaceutical: 26.7 mCi technetium 99m MDP IV

[Series 0: 3 phase bone · 9.33mm/px · 2 acquisitions, 12 frames shown]
[im 1/2]
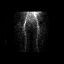
[im 1/2]
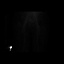
[im 1/2]
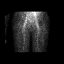
[im 1/2]
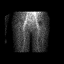
[im 1/2]
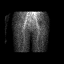
[im 1/2]
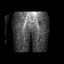
[im 2/2]
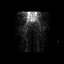
[im 2/2]
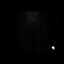
[im 2/2]
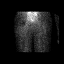
[im 2/2]
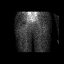
[im 2/2]
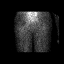
[im 2/2]
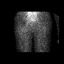

[12 of 12 positions shown; findings below may reference images not displayed]

FINDINGS: Immediate perfusion images and blood pool images are
normal.

On the delayed bone images there is relatively intense activity
around the distal stem of the proximal left femoral prosthesis.
This is at the site of the lucency seen on radiographs of
01/21/2011.  No abnormal activity of the acetabulum.
IMPRESSION: Findings are consistent with loosening of the femoral component of
the left total hip prosthesis.

## 2012-08-24 ENCOUNTER — Ambulatory Visit
Admission: RE | Admit: 2012-08-24 | Discharge: 2012-08-24 | Disposition: A | Discharge status: Home / Self Care | Payer: Medicaid Other | Source: Ambulatory Visit | Attending: Orthopedic Surgery | Admitting: Orthopedic Surgery

## 2012-08-24 ENCOUNTER — Other Ambulatory Visit: Payer: Self-pay | Admitting: Orthopedic Surgery

## 2012-08-24 DIAGNOSIS — M659 Synovitis and tenosynovitis, unspecified: Secondary | ICD-10-CM

## 2012-11-01 ENCOUNTER — Ambulatory Visit
Admission: RE | Admit: 2012-11-01 | Discharge: 2012-11-01 | Disposition: A | Discharge status: Home / Self Care | Payer: Medicaid Other | Source: Ambulatory Visit | Attending: Orthopedic Surgery | Admitting: Orthopedic Surgery

## 2012-11-01 ENCOUNTER — Other Ambulatory Visit: Payer: Self-pay | Admitting: Orthopedic Surgery

## 2012-11-01 DIAGNOSIS — M5430 Sciatica, unspecified side: Secondary | ICD-10-CM

## 2012-12-29 ENCOUNTER — Other Ambulatory Visit: Payer: Self-pay | Admitting: Orthopedic Surgery

## 2012-12-29 ENCOUNTER — Ambulatory Visit
Admission: RE | Admit: 2012-12-29 | Discharge: 2012-12-29 | Disposition: A | Discharge status: Home / Self Care | Payer: Medicaid Other | Source: Ambulatory Visit | Attending: Orthopedic Surgery | Admitting: Orthopedic Surgery

## 2012-12-29 DIAGNOSIS — IMO0002 Reserved for concepts with insufficient information to code with codable children: Secondary | ICD-10-CM

## 2014-02-21 ENCOUNTER — Emergency Department (HOSPITAL_COMMUNITY): Payer: Medicaid Other

## 2014-02-21 ENCOUNTER — Emergency Department (HOSPITAL_COMMUNITY)
Admission: EM | Admit: 2014-02-21 | Discharge: 2014-02-21 | Disposition: A | Discharge status: Home / Self Care | Payer: Medicaid Other | Attending: Emergency Medicine | Admitting: Emergency Medicine

## 2014-02-21 ENCOUNTER — Encounter (HOSPITAL_COMMUNITY): Payer: Self-pay | Admitting: Emergency Medicine

## 2014-02-21 DIAGNOSIS — R51 Headache: Secondary | ICD-10-CM | POA: Diagnosis not present

## 2014-02-21 DIAGNOSIS — Z880 Allergy status to penicillin: Secondary | ICD-10-CM | POA: Diagnosis not present

## 2014-02-21 DIAGNOSIS — E119 Type 2 diabetes mellitus without complications: Secondary | ICD-10-CM | POA: Insufficient documentation

## 2014-02-21 DIAGNOSIS — M129 Arthropathy, unspecified: Secondary | ICD-10-CM | POA: Insufficient documentation

## 2014-02-21 DIAGNOSIS — I10 Essential (primary) hypertension: Secondary | ICD-10-CM | POA: Insufficient documentation

## 2014-02-21 DIAGNOSIS — Z79899 Other long term (current) drug therapy: Secondary | ICD-10-CM | POA: Diagnosis not present

## 2014-02-21 DIAGNOSIS — I4891 Unspecified atrial fibrillation: Secondary | ICD-10-CM | POA: Diagnosis not present

## 2014-02-21 DIAGNOSIS — Z8701 Personal history of pneumonia (recurrent): Secondary | ICD-10-CM | POA: Diagnosis not present

## 2014-02-21 DIAGNOSIS — R011 Cardiac murmur, unspecified: Secondary | ICD-10-CM | POA: Diagnosis not present

## 2014-02-21 DIAGNOSIS — Z8719 Personal history of other diseases of the digestive system: Secondary | ICD-10-CM | POA: Insufficient documentation

## 2014-02-21 DIAGNOSIS — R42 Dizziness and giddiness: Secondary | ICD-10-CM | POA: Insufficient documentation

## 2014-02-21 DIAGNOSIS — H538 Other visual disturbances: Secondary | ICD-10-CM | POA: Insufficient documentation

## 2014-02-21 DIAGNOSIS — I252 Old myocardial infarction: Secondary | ICD-10-CM | POA: Diagnosis not present

## 2014-02-21 DIAGNOSIS — F17200 Nicotine dependence, unspecified, uncomplicated: Secondary | ICD-10-CM | POA: Insufficient documentation

## 2014-02-21 LAB — CBG MONITORING, ED: GLUCOSE-CAPILLARY: 139 mg/dL — AB (ref 70–99)

## 2014-02-21 NOTE — ED Provider Notes (Signed)
CSN: [id number]     Arrival date & time 02/21/14  1316 History   This chart was scribed for non-physician practitioner Oswaldo Conroy, PA-C, working with Samuel Jester, DO, by Yevette Edwards, ED Scribe. This patient was seen in room TR04C/TR04C and [facility]anie patient's care was started at 2:14 PM.  First MD Initiated Contact with Patient 02/21/14 1358     Chief Complaint  Patient presents with  . Eye Problem    HPI HPI Comments: [name] is a 58 y.o. female, with a h/o HTN and DM, who presents to [facility] complaining of bilateral blurred vision which began four days ago. Patient denies worsening or improvement of vision. [name] denies any factors which worsen or improve Melanie blurred vision. She reports Melanie blurred vision began after placing saline drops in her eyes multiple times for several days; she stopped using Melanie saline drops two days ago.  [name] states she wears corrective lens, but she has not worn them for three months as they broke. She denies redness, discharge or pain to her eyes. History of prior MI.  [name] also endorses a left-sided headache, onset two months ago.  She also voices dizziness which is increased with ambulation. Additionally, she endorses chest pain yesterday which spontaneously resolved. She also endorses two weeks of rhinorrhea and a sore throat for which she has used OTC allergy medication. She denies tunnel vision. She also denies SOB,  new-onset weakness, changes in speech nausea, neck pain, a fever, skin changes, a rash, nausea, vomiting or abdominal pain.  [name] has not taken her BP medication today; her BP is 189/98 in [facility] ED. She reports a h/o DM for approximately two years and states that her blood sugar levels yesterday were 150. She uses Metformin and Linagliptin.  [name] is a current smoker.   Past Medical History  Diagnosis Date  . Hypertension   . Complication of anesthesia     Heart attack during surgery  . Myocardial  infarction   . Heart murmur   . Shortness of breath   . Pneumonia     hx of pna  . Dysrhythmia     atrial fibrilation  . GERD (gastroesophageal reflux disease)   . Arthritis    Past Surgical History  Procedure Laterality Date  . Total hip arthroplasty  2010  . Replacement total knee bilateral    . Shoulder arthroscopy w/ rotator cuff repair    . Skin graft    . Abdominal hysterectomy    . Hip surgery  12/09/2011    revision  . Total hip revision  12/09/2011    Procedure: TOTAL HIP REVISION;  Surgeon: Kennieth Rad, MD;  [facility];  Service: Orthopedics;  Laterality: Left;   Family History  Problem Relation Age of Onset  . Anesthesia problems Neg Hx    History  Substance Use Topics  . Smoking status: Current Some Day Smoker  . Smokeless tobacco: Never Used  . Alcohol Use: Yes     Comment: occ beer   No OB history provided.  Review of Systems  A complete 10 system review of systems was obtained, and all systems were negative except where indicated in Melanie HPI and PE.    Allergies  Penicillins  Home Medications   Prior to Admission medications   Medication Sig Start Date End Date Taking? Authorizing Provider  carvedilol (COREG) 12.5 MG tablet Take 12.5 mg by mouth 2 (two) times daily with a meal.  Historical Provider, MD  cyclobenzaprine (FLEXERIL) 10 MG tablet Take 10 mg by mouth 3 (three) times daily as needed. As needed for muscle spasms.    Historical Provider, MD  digoxin (LANOXIN) 0.25 MG tablet Take 250 mcg by mouth daily.    Historical Provider, MD  OVER Melanie COUNTER MEDICATION Take 1 tablet by mouth 2 (two) times daily as needed. Allergy medication. As needed for seasonal allergies.    Historical Provider, MD   Triage Vitals: BP 189/98  Pulse 70  Temp(Src) 98.3 F (36.8 C)  Resp 16  SpO2 98%  Physical Exam  Nursing note and vitals reviewed. Constitutional: She is oriented to person, place, and time. She appears well-developed and well-nourished.  No distress.  HENT:  Head: Normocephalic and atraumatic.  Mouth/Throat: Oropharynx is clear and moist.  Eyes: Conjunctivae and EOM are normal. Pupils are equal, round, and reactive to light. Right eye exhibits no discharge. Left eye exhibits no discharge. Right conjunctiva is not injected. Right conjunctiva has no hemorrhage. Left conjunctiva is not injected. Left conjunctiva has no hemorrhage.  Cardiovascular: Normal rate, regular rhythm, normal heart sounds and intact distal pulses.   Pulmonary/Chest: Effort normal and breath sounds normal. No respiratory distress.  Abdominal: Soft. Bowel sounds are normal. There is no tenderness.  Musculoskeletal: Normal range of motion. She exhibits no tenderness.  Neurological: She is alert and oriented to person, place, and time. No cranial nerve deficit. She exhibits normal muscle tone. Coordination normal.  Strength is intact in upper and lower extremities.  Sensation equally intact. Finger to nose, heel to shin and rapid alternating movements intact. No focal neurological weakness. No facial droop or proptosis. Speech normal and responses are appropriate.  Skin: Skin is warm and dry.  Psychiatric: She has a normal mood and affect. Her behavior is normal.    ED Course  Procedures (including critical care time)  DIAGNOSTIC STUDIES: Oxygen Saturation is 98% on room air, normal by my interpretation.    COORDINATION OF CARE:  2:28 PM- Discussed treatment plan with patient, and Melanie patient agreed to Melanie plan. Melanie plan includes an acuity test.   2:33 PM- Consulted with Dr. Clarene DukeMcManus re [name]'s course of care. Dr. Clarene DukeMcManus recommended a CT scan.   Labs Review Labs Reviewed  CBG MONITORING, ED - Abnormal; Notable for Melanie following:    Glucose-Capillary 139 (*)    All other components within normal limits    Imaging Review Ct Head Wo Contrast  02/21/2014   CLINICAL DATA:  Blurred vision, double vision  EXAM: CT HEAD WITHOUT CONTRAST  TECHNIQUE:  Contiguous axial images were obtained from Melanie base of Melanie skull through Melanie vertex without intravenous contrast.  COMPARISON:  None  FINDINGS: Minimal atrophy.  Normal ventricular morphology.  No midline shift or mass effect.  Normal appearance of brain parenchyma otherwise seen.  No intracranial hemorrhage, mass lesion or evidence acute infarction.  Tiny old lacunar infarct at LEFT caudate head.  No extra-axial fluid collections.  Atherosclerotic calcifications in carotid siphons bilaterally.  No acute bone or sinus abnormalities.  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  No acute intracranial abnormalities.   Electronically Signed   By: Ulyses SouthwardMark  Boles M.D.   On: 02/21/2014 15:05     EKG Interpretation None       MDM   Final diagnoses:  Blurry vision   Patient is a 58 year old female with PMH of MI, HTN, and a smoker who presents with a four  day history of blurry vision. Her BP is 189/98 on admission to [facility] ED. She did not take her BP med today. After taking her HTN med in ED, her BP was 164/76 on discharge. Patient denies any focal weakness or changes in speech. VSS. On exam no focal neurological weakness and a completely benign neurological exam.  Due to her risk factors a CT Head was performed without any acute infarction or hemorrhage. I doubt intracranial pathology. Patient is afebrile, nontoxic, and in no acute distress. Patient is appropriate for outpatient management and is stable for discharge. Extensive discussion of return precautions with Melanie patient and I recommend close follow up with PCP within 2 days.  Discussed return precautions with patient. Discussed all results and patient verbalizes understanding and agrees with plan.  This is a shared patient. This patient was discussed Dr. Clarene Duke.  I personally performed Melanie services described in this documentation, which was scribed in my presence. Melanie recorded information has been reviewed and is  accurate.       Louann Sjogren, PA-C 02/21/14 1746

## 2014-02-21 NOTE — ED Notes (Signed)
Per pt sts bilateral eye redness and blurriness since Monday. sts started after putting drops in eye.

## 2014-02-21 NOTE — Discharge Instructions (Signed)
Return to the emergency room with worsening of symptoms or with symptoms that are concerning, especially chest pain, SOB, weakness, worsening visual changes or changes in speech. Please call your doctor for a follow up appointment within 24 to 48 hours and please let them know you were seen in the Emergency Department. Have them acquire all of your records so that they can discuss the findings with you and formulate a treatment plan to fully care for your new and ongoing medical problems.  Blurred Vision You have been seen today complaining of blurred vision. This means you have a loss of ability to see small details.  CAUSES  Blurred vision can be a symptom of underlying eye problems, such as:  Aging of the eye (presbyopia).  Glaucoma.  Cataracts.  Eye infection.  Eye-related migraine.  Diabetes mellitus.  Fatigue.  Migraine headaches.  High blood pressure.  Breakdown of the back of the eye (macular degeneration).  Problems caused by some medications. The most common cause of blurred vision is the need for eyeglasses or a new prescription. Today in the emergency department, no cause for your blurred vision can be found. SYMPTOMS  Blurred vision is the loss of visual sharpness and detail (acuity). DIAGNOSIS  Should blurred vision continue, you should see your caregiver. If your caregiver is your primary care physician, he or she may choose to refer you to another specialist.  TREATMENT  Do not ignore your blurred vision. Make sure to have it checked out to see if further treatment or referral is necessary. SEEK MEDICAL CARE IF:  You are unable to get into a specialist so we can help you with a referral. SEEK IMMEDIATE MEDICAL CARE IF: You have severe eye pain, severe headache, or sudden loss of vision. MAKE SURE YOU:   Understand these instructions.  Will watch your condition.  Will get help right away if you are not doing well or get worse. Document Released:  06/24/2003 Document Revised: 09/13/2011 Document Reviewed: 01/24/2008 [facility] Patient Information 2015 [employer] [place]. This information is not intended to replace advice given to you by your health care provider. Make sure you discuss any questions you have with your health care provider.

## 2014-02-23 NOTE — ED Provider Notes (Signed)
Medical screening examination/treatment/procedure(s) were performed by non-physician practitioner and as supervising physician I was immediately available for consultation/collaboration.   EKG Interpretation None        Malique Driskill, DO 02/23/14 1557 

## 2014-05-23 ENCOUNTER — Emergency Department (HOSPITAL_COMMUNITY)
Admission: EM | Admit: 2014-05-23 | Discharge: 2014-05-24 | Disposition: A | Discharge status: Home / Self Care | Payer: Medicaid Other | Attending: Emergency Medicine | Admitting: Emergency Medicine

## 2014-05-23 ENCOUNTER — Encounter (HOSPITAL_COMMUNITY): Payer: Self-pay | Admitting: *Deleted

## 2014-05-23 DIAGNOSIS — I10 Essential (primary) hypertension: Secondary | ICD-10-CM | POA: Insufficient documentation

## 2014-05-23 DIAGNOSIS — Z880 Allergy status to penicillin: Secondary | ICD-10-CM | POA: Diagnosis not present

## 2014-05-23 DIAGNOSIS — R011 Cardiac murmur, unspecified: Secondary | ICD-10-CM | POA: Diagnosis not present

## 2014-05-23 DIAGNOSIS — I252 Old myocardial infarction: Secondary | ICD-10-CM | POA: Insufficient documentation

## 2014-05-23 DIAGNOSIS — Z79899 Other long term (current) drug therapy: Secondary | ICD-10-CM | POA: Insufficient documentation

## 2014-05-23 DIAGNOSIS — F10120 Alcohol abuse with intoxication, uncomplicated: Secondary | ICD-10-CM | POA: Insufficient documentation

## 2014-05-23 DIAGNOSIS — R4182 Altered mental status, unspecified: Secondary | ICD-10-CM

## 2014-05-23 DIAGNOSIS — Z8719 Personal history of other diseases of the digestive system: Secondary | ICD-10-CM | POA: Insufficient documentation

## 2014-05-23 DIAGNOSIS — Z720 Tobacco use: Secondary | ICD-10-CM | POA: Insufficient documentation

## 2014-05-23 DIAGNOSIS — Z8701 Personal history of pneumonia (recurrent): Secondary | ICD-10-CM | POA: Diagnosis not present

## 2014-05-23 DIAGNOSIS — F10920 Alcohol use, unspecified with intoxication, uncomplicated: Secondary | ICD-10-CM

## 2014-05-23 LAB — COMPREHENSIVE METABOLIC PANEL
ALBUMIN: 4 g/dL (ref 3.5–5.2)
ALK PHOS: 91 U/L (ref 39–117)
ALT: 18 U/L (ref 0–35)
ANION GAP: 19 — AB (ref 5–15)
AST: 22 U/L (ref 0–37)
BILIRUBIN TOTAL: 0.3 mg/dL (ref 0.3–1.2)
BUN: 9 mg/dL (ref 6–23)
CHLORIDE: 105 meq/L (ref 96–112)
CO2: 20 meq/L (ref 19–32)
Calcium: 9.7 mg/dL (ref 8.4–10.5)
Creatinine, Ser: 0.82 mg/dL (ref 0.50–1.10)
GFR calc Af Amer: 88 mL/min — ABNORMAL LOW (ref >90)
GFR, EST NON AFRICAN AMERICAN: 76 mL/min — AB (ref >90)
GLUCOSE: 142 mg/dL — AB (ref 70–99)
POTASSIUM: 4 meq/L (ref 3.7–5.3)
Sodium: 144 mEq/L (ref 137–147)
Total Protein: 8.3 g/dL (ref 6.0–8.3)

## 2014-05-23 LAB — CBC WITH DIFFERENTIAL/PLATELET
Basophils Absolute: 0 10*3/uL (ref 0.0–0.1)
Basophils Relative: 0 % (ref 0–1)
Eosinophils Absolute: 0.1 10*3/uL (ref 0.0–0.7)
Eosinophils Relative: 1 % (ref 0–5)
HEMATOCRIT: 47.2 % — AB (ref 36.0–46.0)
HEMOGLOBIN: 16.7 g/dL — AB (ref 12.0–15.0)
LYMPHS ABS: 3.2 10*3/uL (ref 0.7–4.0)
LYMPHS PCT: 35 % (ref 12–46)
MCH: 32.2 pg (ref 26.0–34.0)
MCHC: 35.4 g/dL (ref 30.0–36.0)
MCV: 90.9 fL (ref 78.0–100.0)
MONO ABS: 0.6 10*3/uL (ref 0.1–1.0)
MONOS PCT: 6 % (ref 3–12)
NEUTROS ABS: 5.2 10*3/uL (ref 1.7–7.7)
NEUTROS PCT: 58 % (ref 43–77)
Platelets: 302 10*3/uL (ref 150–400)
RBC: 5.19 MIL/uL — AB (ref 3.87–5.11)
RDW: 13.1 % (ref 11.5–15.5)
WBC: 9 10*3/uL (ref 4.0–10.5)

## 2014-05-23 LAB — ETHANOL: Alcohol, Ethyl (B): 277 mg/dL — ABNORMAL HIGH (ref 0–11)

## 2014-05-23 LAB — CBG MONITORING, ED: Glucose-Capillary: 159 mg/dL — ABNORMAL HIGH (ref 70–99)

## 2014-05-23 MED ORDER — SODIUM CHLORIDE 0.9 % IJ SOLN
3.0000 mL | Freq: Two times a day (BID) | INTRAMUSCULAR | Status: DC
Start: 2014-05-23 — End: 2014-05-24

## 2014-05-23 MED ORDER — SODIUM CHLORIDE 0.9 % IV SOLN: 250.0000 mL | INTRAVENOUS | Status: DC | PRN

## 2014-05-23 MED ORDER — SODIUM CHLORIDE 0.9 % IJ SOLN: 3.0000 mL | INTRAMUSCULAR | Status: DC | PRN

## 2014-05-23 NOTE — ED Provider Notes (Signed)
CSN: [id number]     Arrival date & time    History   First MD Initiated Contact with Patient 05/23/14 2306     Chief Complaint  Patient presents with  . Altered Mental Status   level V caveat intoxicated   (Consider location/radiation/quality/duration/timing/severity/associated sxs/prior Treatment) HPI Patient admits to drinking alcohol tonight "7 beers" as she was supposed to go to a friend's funeral tonight. She went to the funeral with a friend and no one else was there. She reports that her friend called 911 because she noted swelling around her right eye. Patient denies falling denies pain anywhere. Denies other drug use. No treatment prior to coming here. Brought by EMS. Past Medical History  Diagnosis Date  . Hypertension   . Complication of anesthesia     Heart attack during surgery  . Myocardial infarction   . Heart murmur   . Shortness of breath   . Pneumonia     hx of pna  . Dysrhythmia     atrial fibrilation  . GERD (gastroesophageal reflux disease)   . Arthritis    Past Surgical History  Procedure Laterality Date  . Total hip arthroplasty  2010  . Replacement total knee bilateral    . Shoulder arthroscopy w/ rotator cuff repair    . Skin graft    . Abdominal hysterectomy    . Hip surgery  12/09/2011    revision  . Total hip revision  12/09/2011    Procedure: TOTAL HIP REVISION;  Surgeon: Kennieth RadArthur F Carter, MD;  [facility];  Service: Orthopedics;  Laterality: Left;   Family History  Problem Relation Age of Onset  . Anesthesia problems Neg Hx    History  Substance Use Topics  . Smoking status: Current Some Day Smoker  . Smokeless tobacco: Never Used  . Alcohol Use: Yes     Comment: occ beer   OB History    No data available     Review of Systems  Unable to perform ROS  patient intoxicated    Allergies  Penicillins  Home Medications   Prior to Admission medications   Medication Sig Start Date End Date Taking? Authorizing Provider   carvedilol (COREG) 12.5 MG tablet Take 12.5 mg by mouth 2 (two) times daily with a meal.    Historical Provider, MD  cyclobenzaprine (FLEXERIL) 10 MG tablet Take 10 mg by mouth 3 (three) times daily as needed. As needed for muscle spasms.    Historical Provider, MD  digoxin (LANOXIN) 0.25 MG tablet Take 250 mcg by mouth daily.    Historical Provider, MD  OVER THE COUNTER MEDICATION Take 1 tablet by mouth 2 (two) times daily as needed. Allergy medication. As needed for seasonal allergies.    Historical Provider, MD   BP 144/86 mmHg  Pulse 110  Temp(Src) 97.8 F (36.6 C) (Oral)  Resp 29  SpO2 94% Physical Exam  Constitutional: She appears well-developed and well-nourished.  HENT:  Right Ear: External ear normal.  Left Ear: External ear normal.  Mouth/Throat: Oropharynx is clear and moist.  Right-sided periorbital hematoma. Nontender. Tympanic membranes normal  Eyes: Conjunctivae are normal. Pupils are equal, round, and reactive to light.  Neck: Neck supple. No tracheal deviation present. No thyromegaly present.  Cardiovascular: Regular rhythm.   No murmur heard. Mildly tachycardic  Pulmonary/Chest: Effort normal and breath sounds normal.  Abdominal: Soft. Bowel sounds are normal. She exhibits no distension. There is no tenderness.  Musculoskeletal: Normal range of motion. She exhibits  no edema or tenderness.  Neurological: She is alert. She has normal reflexes. No cranial nerve deficit. Coordination normal.  Motor strength 5 over 5 overall follow simple commands Glasgow Coma Score 14, one off for speech, speech is slurred  Skin: Skin is warm and dry. No rash noted.  Psychiatric: She has a normal mood and affect.  Nursing note and vitals reviewed.   ED Course  Procedures (including critical care time) Labs Review Labs Reviewed  CBG MONITORING, ED - Abnormal; Notable for the following:    Glucose-Capillary 159 (*)    All other components within normal limits  CBC WITH  DIFFERENTIAL  COMPREHENSIVE METABOLIC PANEL  ETHANOL    Imaging Review No results found.   EKG Interpretation   Date/Time:  Thursday May 23 2014 23:05:14 EST Ventricular Rate:  110 PR Interval:  144 QRS Duration: 75 QT Interval:  309 QTC Calculation: 418 R Axis:   84 Text Interpretation:  Sinus tachycardia Borderline right axis deviation  LVH with secondary repolarization abnormality No significant change since  last tracing Confirmed by Ethelda ChickJACUBOWITZ  MD, Kamala Kolton [phone]([id number]) on 05/23/2014  11:19:56 PM     7:30 AM patient is alert ambulatory Glasgow Coma Score 15 she feels ready to go home. Results for orders placed or performed during the hospital encounter of 05/23/14  CBC with Differential  Result Value Ref Range   WBC 9.0 4.0 - 10.5 K/uL   RBC 5.19 (H) 3.87 - 5.11 MIL/uL   Hemoglobin 16.7 (H) 12.0 - 15.0 g/dL   HCT 46.947.2 (H) 62.936.0 - 52.846.0 %   MCV 90.9 78.0 - 100.0 fL   MCH 32.2 26.0 - 34.0 pg   MCHC 35.4 30.0 - 36.0 g/dL   RDW 41.313.1 24.411.5 - 01.015.5 %   Platelets 302 150 - 400 K/uL   Neutrophils Relative % 58 43 - 77 %   Neutro Abs 5.2 1.7 - 7.7 K/uL   Lymphocytes Relative 35 12 - 46 %   Lymphs Abs 3.2 0.7 - 4.0 K/uL   Monocytes Relative 6 3 - 12 %   Monocytes Absolute 0.6 0.1 - 1.0 K/uL   Eosinophils Relative 1 0 - 5 %   Eosinophils Absolute 0.1 0.0 - 0.7 K/uL   Basophils Relative 0 0 - 1 %   Basophils Absolute 0.0 0.0 - 0.1 K/uL  Comprehensive metabolic panel  Result Value Ref Range   Sodium 144 137 - 147 mEq/L   Potassium 4.0 3.7 - 5.3 mEq/L   Chloride 105 96 - 112 mEq/L   CO2 20 19 - 32 mEq/L   Glucose, Bld 142 (H) 70 - 99 mg/dL   BUN 9 6 - 23 mg/dL   Creatinine, Ser 2.720.82 0.50 - 1.10 mg/dL   Calcium 9.7 8.4 - 53.610.5 mg/dL   Total Protein 8.3 6.0 - 8.3 g/dL   Albumin 4.0 3.5 - 5.2 g/dL   AST 22 0 - 37 U/L   ALT 18 0 - 35 U/L   Alkaline Phosphatase 91 39 - 117 U/L   Total Bilirubin 0.3 0.3 - 1.2 mg/dL   GFR calc non Af Amer 76 (L) >90 mL/min   GFR calc Af Amer 88  (L) >90 mL/min   Anion gap 19 (H) 5 - 15  Ethanol  Result Value Ref Range   Alcohol, Ethyl (B) 277 (H) 0 - 11 mg/dL  CBG monitoring, ED  Result Value Ref Range   Glucose-Capillary 159 (H) 70 - 99 mg/dL   Ct Head Wo  Contrast  05/24/2014   CLINICAL DATA:  Alcohol intoxication, fell and hit RIGHT side of head. Slurred speech.  EXAM: CT HEAD WITHOUT CONTRAST  CT CERVICAL SPINE WITHOUT CONTRAST  TECHNIQUE: Multidetector CT imaging of the head and cervical spine was performed following the standard protocol without intravenous contrast. Multiplanar CT image reconstructions of the cervical spine were also generated.  COMPARISON:  CT of the head February 21, 2014  FINDINGS: CT HEAD FINDINGS  Mild ventriculomegaly, likely on the basis of global parenchymal brain volume loss as there is overall commensurate enlargement of cerebral sulci and cerebellar folia, unchanged. 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 Mild white matter changes suggest chronic small vessel ischemic disease, confluent about the RIGHT lateral ventricle. No acute large vascular territory infarct. No abnormal extra-axial fluid collection. Moderate calcific atherosclerosis of the carotid siphons.  Moderate RIGHT supraorbital/frontal scalp hematoma without subcutaneous gas or radiopaque foreign bodies. Included view of the ocular globes and orbital contents are unremarkable. No skull fracture. Unchanged LEFT parietal scalp scarring. Visualized paranasal sinuses and mastoid air cells are well aerated.  CT CERVICAL SPINE FINDINGS  Vertebral bodies and posterior elements are intact and aligned with straightened cervical lordosis. Moderate C3-4, mild to moderate C5-6 degenerative discs. C1-2 articulation maintained. No destructive bony lesions. Mild calcific atherosclerosis of the carotid bulbs. Included view of the lung apices demonstrate centrilobular emphysema.  IMPRESSION: CT HEAD: Moderate RIGHT supraorbital/ frontal  scalp hematoma. No skull fracture. No acute intracranial process.  Mild parenchymal brain volume loss, unchanged. Mild white matter changes suggest chronic small vessel ischemic disease, remote LEFT internal capsule lacunar infarct.  CT CERVICAL SPINE: Straightened cervical lordosis without acute fracture or malalignment.   Electronically Signed   By: Awilda Metro   On: 05/24/2014 02:04   Ct Cervical Spine Wo Contrast  05/24/2014   CLINICAL DATA:  Alcohol intoxication, fell and hit RIGHT side of head. Slurred speech.  EXAM: CT HEAD WITHOUT CONTRAST  CT CERVICAL SPINE WITHOUT CONTRAST  TECHNIQUE: Multidetector CT imaging of the head and cervical spine was performed following the standard protocol without intravenous contrast. Multiplanar CT image reconstructions of the cervical spine were also generated.  COMPARISON:  CT of the head February 21, 2014  FINDINGS: CT HEAD FINDINGS  Mild ventriculomegaly, likely on the basis of global parenchymal brain volume loss as there is overall commensurate enlargement of cerebral sulci and cerebellar folia, unchanged. 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 Mild white matter changes suggest chronic small vessel ischemic disease, confluent about the RIGHT lateral ventricle. No acute large vascular territory infarct. No abnormal extra-axial fluid collection. Moderate calcific atherosclerosis of the carotid siphons.  Moderate RIGHT supraorbital/frontal scalp hematoma without subcutaneous gas or radiopaque foreign bodies. Included view of the ocular globes and orbital contents are unremarkable. No skull fracture. Unchanged LEFT parietal scalp scarring. Visualized paranasal sinuses and mastoid air cells are well aerated.  CT CERVICAL SPINE FINDINGS  Vertebral bodies and posterior elements are intact and aligned with straightened cervical lordosis. Moderate C3-4, mild to moderate C5-6 degenerative discs. C1-2 articulation maintained. No destructive  bony lesions. Mild calcific atherosclerosis of the carotid bulbs. Included view of the lung apices demonstrate centrilobular emphysema.  IMPRESSION: CT HEAD: Moderate RIGHT supraorbital/ frontal scalp hematoma. No skull fracture. No acute intracranial process.  Mild parenchymal brain volume loss, unchanged. Mild white matter changes suggest chronic small vessel ischemic disease, remote LEFT internal capsule lacunar infarct.  CT CERVICAL SPINE: Straightened  cervical lordosis without acute fracture or malalignment.   Electronically Signed   By: Awilda Metro   On: 05/24/2014 02:04    MDM   Final diagnoses:  Altered mental status   Patient admits that she may have a drinking problem Plan discharged to home referral resource guide Diagnosis #1 acute alcohol intoxication #2 minor closed head trauma #3hyperglycemia    Doug Sou, MD 05/24/14 0730

## 2014-05-23 NOTE — ED Notes (Signed)
Pt from home. Friend noted slurred speech. Pt admits to drinking 7 beers. Pt has right orbital swelling. Pt alert and oriented x 3. Follows commands.

## 2014-05-24 ENCOUNTER — Encounter (HOSPITAL_COMMUNITY): Payer: Self-pay

## 2014-05-24 ENCOUNTER — Emergency Department (HOSPITAL_COMMUNITY): Payer: Medicaid Other

## 2014-05-24 NOTE — ED Notes (Signed)
This RN called CT and spoke with Annabelle Harmanana regarding when patient will be transported to CT. Annabelle HarmanDana reports there are two patients ahead of her. Pt updated.

## 2014-05-24 NOTE — ED Notes (Signed)
Gave pt graham crackers, peanut butter, apple sauce, and ginger ale

## 2014-05-24 NOTE — ED Notes (Signed)
Patient asked for and received a Sprite.  

## 2014-05-24 NOTE — ED Notes (Signed)
Patient transported to CT 

## 2014-05-24 NOTE — Discharge Instructions (Signed)
Alcohol Intoxication Your blood sugar tonight was mildly elevated at 142. You may be borderline diabetic. Please contact Dr. Loleta ChanceHill and ask him to order a test known his hemoglobin A1c to check you for diabetes. Call any of the numbers on the resource guide to get help with your drinking problem Alcohol intoxication occurs when the amount of alcohol that a person has consumed impairs his or her ability to mentally and physically function. Alcohol directly impairs the normal chemical activity of the brain. Drinking large amounts of alcohol can lead to changes in mental function and behavior, and it can cause many physical effects that can be harmful.  Alcohol intoxication can range in severity from mild to very severe. Various factors can affect the level of intoxication that occurs, such as the person's age, gender, weight, frequency of alcohol consumption, and the presence of other medical conditions (such as diabetes, seizures, or heart conditions). Dangerous levels of alcohol intoxication may occur when people drink large amounts of alcohol in a short period (binge drinking). Alcohol can also be especially dangerous when combined with certain prescription medicines or "recreational" drugs. SIGNS AND SYMPTOMS Some common signs and symptoms of mild alcohol intoxication include:  Loss of coordination.  Changes in mood and behavior.  Impaired judgment.  Slurred speech. As alcohol intoxication progresses to more severe levels, other signs and symptoms will appear. These may include:  Vomiting.  Confusion and impaired memory.  Slowed breathing.  Seizures.  Loss of consciousness. DIAGNOSIS  Your health care provider will take a medical history and perform a physical exam. You will be asked about the amount and type of alcohol you have consumed. Blood tests will be done to measure the concentration of alcohol in your blood. In many places, your blood alcohol level must be lower than 80 mg/dL  (1.61%0.08%) to legally drive. However, many dangerous effects of alcohol can occur at much lower levels.  TREATMENT  People with alcohol intoxication often do not require treatment. Most of the effects of alcohol intoxication are temporary, and they go away as the alcohol naturally leaves the body. Your health care provider will monitor your condition until you are stable enough to go home. Fluids are sometimes given through an IV access tube to help prevent dehydration.  HOME CARE INSTRUCTIONS  Do not drive after drinking alcohol.  Stay hydrated. Drink enough water and fluids to keep your urine clear or pale yellow. Avoid caffeine.   Only take over-the-counter or prescription medicines as directed by your health care provider.  SEEK MEDICAL CARE IF:   You have persistent vomiting.   You do not feel better after a few days.  You have frequent alcohol intoxication. Your health care provider can help determine if you should see a substance use treatment counselor. SEEK IMMEDIATE MEDICAL CARE IF:   You become shaky or tremble when you try to stop drinking.   You shake uncontrollably (seizure).   You throw up (vomit) blood. This may be bright red or may look like black coffee grounds.   You have blood in your stool. This may be bright red or may appear as a black, tarry, bad smelling stool.   You become lightheaded or faint.  MAKE SURE YOU:   Understand these instructions.  Will watch your condition.  Will get help right away if you are not doing well or get worse. Document Released: 03/31/2005 Document Revised: 02/21/2013 Document Reviewed: 11/24/2012 [facility] Patient Information 2015 [place], [place]. This information is not intended to  replace advice given to you by your health care provider. Make sure you discuss any questions you have with your health care provider.  Emergency Department Resource Guide 1) Find a Doctor and Pay Out of Pocket Although you won't have to find  out who is covered by your insurance plan, it is a good idea to ask around and get recommendations. You will then need to call the office and see if the doctor you have chosen will accept you as a new patient and what types of options they offer for patients who are self-pay. Some doctors offer discounts or will set up payment plans for their patients who do not have insurance, but you will need to ask so you aren't surprised when you get to your appointment.  2) Contact Your Local Health Department Not all health departments have doctors that can see patients for sick visits, but many do, so it is worth a call to see if yours does. If you don't know where your local health department is, you can check in your phone book. The CDC also has a tool to help you locate your state's health department, and many state websites also have listings of all of their local health departments.  3) Find a Walk-in Clinic If your illness is not likely to be very severe or complicated, you may want to try a walk in clinic. These are popping up all over the country in pharmacies, drugstores, and shopping centers. They're usually staffed by nurse practitioners or physician assistants that have been trained to treat common illnesses and complaints. They're usually fairly quick and inexpensive. However, if you have serious medical issues or chronic medical problems, these are probably not your best option.  No Primary Care Doctor: - Call Health Connect at  [phone] - they can help you locate a primary care doctor that  accepts your insurance, provides certain services, etc. - Physician Referral Service- [phone]  Chronic Pain Problems: Organization         Address  Phone   Notes  [facility]  [phone] Patients need to be referred by their primary care doctor.   Medication Assistance: Organization         Address  Phone   Notes  [facility] Med[facility] [address]  [place] [place], [place] [address] [phone] --Must be a resident of [facility] -- Must have NO insurance coverage whatsoever (no Medicaid/ Medicare, etc.) -- The pt. MUST have a primary care doctor that directs their care regularly and follows them in the community   [facility]  [phone]   [employer]  [phone]    Agencies that provide inexpensive medical care: Organization         Address  Phone   Notes  [facility]  [phone]   [facility]    [phone]   [facility] [address] [place], [place] [address] [phone]   [facility] [address] [place], [place] [phone]   Planned Parenthood    [phone]   [facility]    [phone]   Community Health and [facility]  [address], [place] Phone:  [phone], Fax:  [phone] Hours of Operation:  9 am - 6 pm, M-F.  Also accepts Medicaid/Medicare and self-pay.  [facility]  [address], [employer] Phone: [phone])  [phone], Fax: [phone]. Hours of Operation:  8:30 am - 5:30 pm, M-F.  Also accepts Medicaid and self-pay.  [facility] [place] [address], [place] Phone: [phone](336) [phone]   [facility] [address], [place], [place] [phone], Ext. [phone] Mondays & Thursdays: 7-9 AM.  First 15 patients are seen on a first come, first serve basis.    Medicaid-accepting [facility] Providers:  Organization         Address  Phone   Notes  [facility] [address], [place] [phone]) [phone] Also accepts self-pay patients.  [facility] [address], [place]  [phone](336) [phone]   [facility] [address], [place] [phone]) [phone]   [facility] [address] [place] [address], [place] [phone]) [phone]   Renaye RakersVeita Bland  [address], [place]   [phone]) [phone] Only accepts [place] Access [place] patients after they have their name applied to their card.   Self-Pay (no insurance) in [facility]:  Organization         Address  Phone   Notes  Sickle Cell Patients, [facility] [place] [facility] [address], [place] [phone]) [phone]   [facility] [address], [place] [phone](336) [phone]   [facility] [place]  [address], [place] [phone]) [phone]   [facility]/Dr. Osei-Bonsu  [address] [place] [address], [place] or [phone], [place] [phone]) [phone] Phone number for both [place] and [place] locations is the same.  [facility] [address], [place] [phone]) [phone]   [facility] [address] [place] [address], [place] or [address] [phone]) [phone]   [facility] [address], [place] [phone](336) [phone], phone; [phone](336) [phone], fax Sees patients 1st and 3rd Saturday of every month.  Must not qualify for public or private insurance (i.e. Medicaid, Medicare, [place] Health Choice, Veterans' Benefits)  Household income should be no more than 200% of the poverty level The clinic cannot treat you if you are pregnant or think you are pregnant  Sexually transmitted diseases are not treated at the clinic.    Dental Care: Organization         Address  Phone  Notes  [facility] Department of [facility] [address], [place] [phone]) [phone] Accepts children up to age 60 who are enrolled in [place] or [place] Health Choice; pregnant women with a Medicaid card; and children who have applied for Medicaid or [employer] Health Choice, but were declined, whose parents can pay a reduced fee at time of service.  [facility] Department of [facility] [place]  [address], [place] [phone] Accepts children up to age 60  who are enrolled in [place] or [place] Health Choice; pregnant women with a Medicaid card; and children who have applied for Medicaid or [place] Health Choice, but were declined, whose parents can pay a reduced fee at time of service.  [facility]  [address], [place] [phone]) [phone] Patients are seen by appointment only. Walk-ins are not accepted. [facility] will see patients 60 years of age and older. Monday - Tuesday (8am-5pm) Most Wednesdays (8:30-5pm) $30 per visit, cash only  [facility]  [address], [place] [phone] Patients are seen by appointment only. Walk-ins are not accepted. [facility] will see patients 46 years of age and older. One Wednesday Evening (Monthly: Volunteer Based).  $30 per visit, cash only  [employer]  [phone] for adults; Children under age 59, call Graduate Pediatric Dentistry at [phone]. Children aged 59-14, please call [phone] to request a pediatric application.  Dental services are provided in all areas of dental care including fillings, crowns and bridges, complete and partial dentures, implants, gum treatment, root canals, and extractions. Preventive care is also provided. Treatment is provided to both adults and children. Patients are selected via a lottery and there is often a waiting list.   [facility] [address] [place]  [phone] [email or web address]   [facility] [address] [place], [place] [phone], Ext. [phone] Second and Fourth Thursday of each month, opens at 6:30 AM; Clinic ends at 9 AM.  Patients are seen on a first-come first-served basis, and a limited number are seen during each clinic.   [facility]  [address] [place], [place] [phone]   Eligibility Requirements You must have lived in [place], [place], or [place] counties for at least the last three months.    You cannot be eligible for state or federal sponsored [place], including [employer], [place], or [employer].   You generally cannot be eligible for healthcare insurance through your employer.    How to apply: Eligibility screenings are held every Tuesday and Wednesday afternoon from 1:00 pm until 4:00 pm. You do not need an appointment for the interview!  [facility] [address] [place], [place] [phone]   [facility] Health Department  [phone]   [facility] Health Department  [phone]   [facility]  [phone]    Behavioral Health Resources in the Community: Intensive Outpatient Programs Organization         Address  Phone  Notes  [facility] [address], [place], [place] [phone]   [facility] [address], [place], [place] [phone]   [facility] [address] [place], [place]  [phone]   [facility] [address],  [place], [place] [phone] or [phone]   Substance Abuse Resources Organization         Address  Phone  Notes  Alcohol [facility] Drug Services  [phone]   [facility]  [phone]   [facility]  [phone]   Floydene Flock  [phone]   Residential & Outpatient Substance Abuse Program  [phone]   Psychological Services Organization         Address  Phone  Notes  [facility]  [phone]   [facility]  [phone]   [facility] [address] [place] [phone] or [phone]    Mobile Crisis Teams Organization         Address  Phone  Notes  [facility], Mobile Crisis Care Unit  [phone]   [facility]  [address] [place], [place] [phone]   Doristine Locks [address] [place] [phone]    Self-Help/Support  Groups Organization         Address  Phone             Notes  [facility] [place] - variety of support groups  [phone]- I7437963 Call for more information  Narcotics Anonymous (NA), [facility] [address], [employer] [place]  2 meetings at this location   Residential Sports administrator         Address  Phone  Notes  [facility] [address],    [place]  [phone]   [facility]  [address], [place] [address], [place], [place] [phone]   [facility] [address] [place], [place] [phone] Admissions: 8am-3pm M-F  [facility] [address],    [place], [place] [phone]   [facility] [address] [place], [place], [place] [phone]   [facility] [address]  [place], [place] [phone]   [facility] - Intensive Outpatient [address], [place], [place] [phone]   [facility] ([facility]iction [facility] [address] [place].,  [place], [place] [phone] or [phone]   [facility] ([facility]) [address] [place], [place] [phone] Accepts Medicaid  [facility] [address]  [place] [phone] Substance Abuse/Addiction Treatment   [facility] Organization         Address  Phone  Notes  [facility]  [phone]   Angie Fava, PhD [address] [place], [place]   [phone] or [phone]   [facility] Behavioral   [address] [place], [place] [phone]   [facility] [address], [place], [place] [phone] Insurance/Medicaid/sponsorship through [facility] [address]                                    [place], [place] [phone] Therapy/tele-psych/case  [facility] [address], [place] [phone]    Dr. Lolly Mustache  [phone]   [facility]  [employer] [facility] Dept. 1) [address] [place] 2) [address] [place] 3)  [address], [place] [phone]  [phone]   [facility] Child Abuse Hotline 808-337-0658 or [phone] (After Hours)

## 2014-08-12 ENCOUNTER — Ambulatory Visit
Admission: RE | Admit: 2014-08-12 | Discharge: 2014-08-12 | Disposition: A | Discharge status: Home / Self Care | Payer: Medicaid Other | Source: Ambulatory Visit | Attending: Orthopedic Surgery | Admitting: Orthopedic Surgery

## 2014-08-12 ENCOUNTER — Other Ambulatory Visit: Payer: Self-pay | Admitting: Orthopedic Surgery

## 2014-08-12 DIAGNOSIS — R053 Chronic cough: Secondary | ICD-10-CM

## 2014-08-12 DIAGNOSIS — M659 Synovitis and tenosynovitis, unspecified: Secondary | ICD-10-CM

## 2014-08-12 DIAGNOSIS — R05 Cough: Secondary | ICD-10-CM

## 2014-08-12 DIAGNOSIS — M65959 Unspecified synovitis and tenosynovitis, unspecified thigh: Secondary | ICD-10-CM

## 2015-05-14 ENCOUNTER — Emergency Department (HOSPITAL_COMMUNITY): Payer: Medicaid Other

## 2015-05-14 ENCOUNTER — Inpatient Hospital Stay (HOSPITAL_COMMUNITY)
Admission: EM | Admit: 2015-05-14 | Discharge: 2015-05-17 | DRG: 065 | Disposition: A | Discharge status: Home Health | Payer: Medicaid Other | Attending: Internal Medicine | Admitting: Internal Medicine

## 2015-05-14 ENCOUNTER — Encounter (HOSPITAL_COMMUNITY): Payer: Self-pay

## 2015-05-14 DIAGNOSIS — F1010 Alcohol abuse, uncomplicated: Secondary | ICD-10-CM | POA: Diagnosis not present

## 2015-05-14 DIAGNOSIS — R296 Repeated falls: Secondary | ICD-10-CM | POA: Diagnosis present

## 2015-05-14 DIAGNOSIS — R471 Dysarthria and anarthria: Secondary | ICD-10-CM | POA: Diagnosis present

## 2015-05-14 DIAGNOSIS — R7989 Other specified abnormal findings of blood chemistry: Secondary | ICD-10-CM | POA: Diagnosis not present

## 2015-05-14 DIAGNOSIS — I639 Cerebral infarction, unspecified: Secondary | ICD-10-CM | POA: Diagnosis not present

## 2015-05-14 DIAGNOSIS — I4891 Unspecified atrial fibrillation: Secondary | ICD-10-CM | POA: Diagnosis not present

## 2015-05-14 DIAGNOSIS — E785 Hyperlipidemia, unspecified: Secondary | ICD-10-CM | POA: Diagnosis present

## 2015-05-14 DIAGNOSIS — D72829 Elevated white blood cell count, unspecified: Secondary | ICD-10-CM | POA: Diagnosis present

## 2015-05-14 DIAGNOSIS — F1410 Cocaine abuse, uncomplicated: Secondary | ICD-10-CM | POA: Diagnosis present

## 2015-05-14 DIAGNOSIS — Z720 Tobacco use: Secondary | ICD-10-CM

## 2015-05-14 DIAGNOSIS — I63322 Cerebral infarction due to thrombosis of left anterior cerebral artery: Secondary | ICD-10-CM | POA: Diagnosis not present

## 2015-05-14 DIAGNOSIS — Z96653 Presence of artificial knee joint, bilateral: Secondary | ICD-10-CM | POA: Diagnosis present

## 2015-05-14 DIAGNOSIS — I160 Hypertensive urgency: Secondary | ICD-10-CM | POA: Diagnosis present

## 2015-05-14 DIAGNOSIS — I252 Old myocardial infarction: Secondary | ICD-10-CM | POA: Diagnosis not present

## 2015-05-14 DIAGNOSIS — G8191 Hemiplegia, unspecified affecting right dominant side: Secondary | ICD-10-CM | POA: Diagnosis present

## 2015-05-14 DIAGNOSIS — F10129 Alcohol abuse with intoxication, unspecified: Secondary | ICD-10-CM | POA: Diagnosis present

## 2015-05-14 DIAGNOSIS — I63422 Cerebral infarction due to embolism of left anterior cerebral artery: Secondary | ICD-10-CM | POA: Diagnosis not present

## 2015-05-14 DIAGNOSIS — I6789 Other cerebrovascular disease: Secondary | ICD-10-CM | POA: Diagnosis not present

## 2015-05-14 DIAGNOSIS — Y908 Blood alcohol level of 240 mg/100 ml or more: Secondary | ICD-10-CM | POA: Diagnosis present

## 2015-05-14 DIAGNOSIS — F17200 Nicotine dependence, unspecified, uncomplicated: Secondary | ICD-10-CM | POA: Diagnosis present

## 2015-05-14 DIAGNOSIS — Z8673 Personal history of transient ischemic attack (TIA), and cerebral infarction without residual deficits: Secondary | ICD-10-CM | POA: Diagnosis not present

## 2015-05-14 DIAGNOSIS — I480 Paroxysmal atrial fibrillation: Secondary | ICD-10-CM | POA: Diagnosis present

## 2015-05-14 DIAGNOSIS — I2510 Atherosclerotic heart disease of native coronary artery without angina pectoris: Secondary | ICD-10-CM | POA: Diagnosis present

## 2015-05-14 DIAGNOSIS — Z96642 Presence of left artificial hip joint: Secondary | ICD-10-CM | POA: Diagnosis present

## 2015-05-14 DIAGNOSIS — D751 Secondary polycythemia: Secondary | ICD-10-CM | POA: Diagnosis present

## 2015-05-14 DIAGNOSIS — Z9119 Patient's noncompliance with other medical treatment and regimen: Secondary | ICD-10-CM

## 2015-05-14 DIAGNOSIS — K219 Gastro-esophageal reflux disease without esophagitis: Secondary | ICD-10-CM | POA: Diagnosis present

## 2015-05-14 DIAGNOSIS — R0789 Other chest pain: Secondary | ICD-10-CM | POA: Diagnosis not present

## 2015-05-14 DIAGNOSIS — I10 Essential (primary) hypertension: Secondary | ICD-10-CM | POA: Diagnosis present

## 2015-05-14 DIAGNOSIS — I6300 Cerebral infarction due to thrombosis of unspecified precerebral artery: Secondary | ICD-10-CM | POA: Diagnosis not present

## 2015-05-14 DIAGNOSIS — Z7952 Long term (current) use of systemic steroids: Secondary | ICD-10-CM

## 2015-05-14 DIAGNOSIS — I6319 Cerebral infarction due to embolism of other precerebral artery: Secondary | ICD-10-CM | POA: Diagnosis not present

## 2015-05-14 DIAGNOSIS — R4781 Slurred speech: Secondary | ICD-10-CM | POA: Diagnosis present

## 2015-05-14 DIAGNOSIS — I350 Nonrheumatic aortic (valve) stenosis: Secondary | ICD-10-CM | POA: Diagnosis not present

## 2015-05-14 DIAGNOSIS — R799 Abnormal finding of blood chemistry, unspecified: Secondary | ICD-10-CM | POA: Diagnosis not present

## 2015-05-14 DIAGNOSIS — I63522 Cerebral infarction due to unspecified occlusion or stenosis of left anterior cerebral artery: Principal | ICD-10-CM | POA: Diagnosis present

## 2015-05-14 DIAGNOSIS — I6330 Cerebral infarction due to thrombosis of unspecified cerebral artery: Secondary | ICD-10-CM | POA: Diagnosis not present

## 2015-05-14 DIAGNOSIS — R531 Weakness: Secondary | ICD-10-CM | POA: Diagnosis present

## 2015-05-14 LAB — CBC
HEMATOCRIT: 48.3 % — AB (ref 36.0–46.0)
Hemoglobin: 17.6 g/dL — ABNORMAL HIGH (ref 12.0–15.0)
MCH: 31.8 pg (ref 26.0–34.0)
MCHC: 36.4 g/dL — ABNORMAL HIGH (ref 30.0–36.0)
MCV: 87.3 fL (ref 78.0–100.0)
PLATELETS: 276 10*3/uL (ref 150–400)
RBC: 5.53 MIL/uL — ABNORMAL HIGH (ref 3.87–5.11)
RDW: 13.2 % (ref 11.5–15.5)
WBC: 11 10*3/uL — ABNORMAL HIGH (ref 4.0–10.5)

## 2015-05-14 LAB — URINALYSIS, ROUTINE W REFLEX MICROSCOPIC
Bilirubin Urine: NEGATIVE
Glucose, UA: NEGATIVE mg/dL
Hgb urine dipstick: NEGATIVE
Ketones, ur: NEGATIVE mg/dL
LEUKOCYTES UA: NEGATIVE
Nitrite: NEGATIVE
PROTEIN: NEGATIVE mg/dL
SPECIFIC GRAVITY, URINE: 1.006 (ref 1.005–1.030)
Urobilinogen, UA: 0.2 mg/dL (ref 0.0–1.0)
pH: 6.5 (ref 5.0–8.0)

## 2015-05-14 LAB — RAPID URINE DRUG SCREEN, HOSP PERFORMED
Amphetamines: NOT DETECTED
Barbiturates: NOT DETECTED
Benzodiazepines: NOT DETECTED
Cocaine: POSITIVE — AB
OPIATES: NOT DETECTED
Tetrahydrocannabinol: NOT DETECTED

## 2015-05-14 LAB — COMPREHENSIVE METABOLIC PANEL
ALT: 19 U/L (ref 14–54)
ANION GAP: 11 (ref 5–15)
AST: 33 U/L (ref 15–41)
Albumin: 3.9 g/dL (ref 3.5–5.0)
Alkaline Phosphatase: 90 U/L (ref 38–126)
BUN: 9 mg/dL (ref 6–20)
CHLORIDE: 101 mmol/L (ref 101–111)
CO2: 24 mmol/L (ref 22–32)
CREATININE: 1.05 mg/dL — AB (ref 0.44–1.00)
Calcium: 9.6 mg/dL (ref 8.9–10.3)
GFR, EST NON AFRICAN AMERICAN: 56 mL/min — AB (ref >60)
Glucose, Bld: 115 mg/dL — ABNORMAL HIGH (ref 65–99)
POTASSIUM: 3.6 mmol/L (ref 3.5–5.1)
SODIUM: 136 mmol/L (ref 135–145)
Total Bilirubin: 1 mg/dL (ref 0.3–1.2)
Total Protein: 7.6 g/dL (ref 6.5–8.1)

## 2015-05-14 LAB — APTT: aPTT: 27 seconds (ref 24–37)

## 2015-05-14 LAB — I-STAT CHEM 8, ED
BUN: 11 mg/dL (ref 6–20)
CHLORIDE: 102 mmol/L (ref 101–111)
Calcium, Ion: 1.05 mmol/L — ABNORMAL LOW (ref 1.13–1.30)
Creatinine, Ser: 1 mg/dL (ref 0.44–1.00)
Glucose, Bld: 115 mg/dL — ABNORMAL HIGH (ref 65–99)
HCT: 52 % — ABNORMAL HIGH (ref 36.0–46.0)
Hemoglobin: 17.7 g/dL — ABNORMAL HIGH (ref 12.0–15.0)
Potassium: 3.5 mmol/L (ref 3.5–5.1)
SODIUM: 140 mmol/L (ref 135–145)
TCO2: 24 mmol/L (ref 0–100)

## 2015-05-14 LAB — DIFFERENTIAL
BASOS PCT: 0 %
Basophils Absolute: 0 10*3/uL (ref 0.0–0.1)
EOS ABS: 0.1 10*3/uL (ref 0.0–0.7)
Eosinophils Relative: 1 %
Lymphocytes Relative: 30 %
Lymphs Abs: 3.3 10*3/uL (ref 0.7–4.0)
MONO ABS: 1.1 10*3/uL — AB (ref 0.1–1.0)
Monocytes Relative: 10 %
Neutro Abs: 6.5 10*3/uL (ref 1.7–7.7)
Neutrophils Relative %: 59 %

## 2015-05-14 LAB — ETHANOL

## 2015-05-14 LAB — I-STAT TROPONIN, ED: TROPONIN I, POC: 0.02 ng/mL (ref 0.00–0.08)

## 2015-05-14 LAB — PROTIME-INR
INR: 1.01 (ref 0.00–1.49)
PROTHROMBIN TIME: 13.5 s (ref 11.6–15.2)

## 2015-05-14 MED ORDER — HYDRALAZINE HCL 20 MG/ML IJ SOLN
10.0000 mg | Freq: Four times a day (QID) | INTRAMUSCULAR | Status: DC | PRN
  Administered 2015-05-15 (×2): 10 mg via INTRAVENOUS
  Filled 2015-05-14 (×2): qty 1

## 2015-05-14 NOTE — ED Notes (Signed)
Pt states that her leg feels better when it is elevated

## 2015-05-14 NOTE — ED Notes (Signed)
Patient transported to CT 

## 2015-05-14 NOTE — ED Notes (Signed)
MD at bedside. 

## 2015-05-14 NOTE — ED Notes (Signed)
Pt is on digoxin

## 2015-05-14 NOTE — ED Notes (Signed)
Per [facility]: per her dr she was supposed to stop BP meds a week ago, and she did. She was supposed to start a new med today but was unable to pick it up from pharmacy. Been having the following symptoms for 2-5 days, headache, blurry, slurred speech, pain in right hand and leg with contraction in hand in foot, Pt is repetative, knows this, but doesn't know why and can't stop . Pt states that she has fallen multiple times over the past week, fell yesterday but denies hitting her head. Pt is unsure of what meds she used to be on or was on. Pt is not on blood thinners.   20 g IV left hand

## 2015-05-14 NOTE — ED Provider Notes (Signed)
CSN: [id number]     Arrival date & time 05/14/15  2103 History   First MD Initiated Contact with Patient 05/14/15 2117     Chief Complaint  Patient presents with  . Hypertension      HPI Patient presents to the emergency department complaining of slurred speech and right leg weakness.  She states this is been present for 24-48 hours.  She has been using cocaine in the past several days.  She reports noncompliance with her blood pressure medications secondary to issues picking it up from the pharmacy.  Patient denies chest pain or shortness of breath at this time.  No prior history of stroke.  She has a history of hypertension and ongoing tobacco abuse.    Past Medical History  Diagnosis Date  . Hypertension   . Complication of anesthesia     Heart attack during surgery  . Myocardial infarction (HCC)   . Heart murmur   . Shortness of breath   . Pneumonia     hx of pna  . Dysrhythmia     atrial fibrilation  . GERD (gastroesophageal reflux disease)   . Arthritis    Past Surgical History  Procedure Laterality Date  . Total hip arthroplasty  2010  . Replacement total knee bilateral    . Shoulder arthroscopy w/ rotator cuff repair    . Skin graft    . Abdominal hysterectomy    . Hip surgery  12/09/2011    revision  . Total hip revision  12/09/2011    Procedure: TOTAL HIP REVISION;  Surgeon: Kennieth RadArthur F Carter, MD;  [facility];  Service: Orthopedics;  Laterality: Left;   Family History  Problem Relation Age of Onset  . Anesthesia problems Neg Hx    Social History  Substance Use Topics  . Smoking status: Current Some Day Smoker  . Smokeless tobacco: Never Used  . Alcohol Use: Yes     Comment: occ beer   OB History    No data available     Review of Systems  All other systems reviewed and are negative.     Allergies  Penicillins  Home Medications   Prior to Admission medications   Medication Sig Start Date End Date Taking? Authorizing Provider  carvedilol  (COREG) 12.5 MG tablet Take 12.5 mg by mouth 2 (two) times daily with a meal.   Yes Historical Provider, MD  cyclobenzaprine (FLEXERIL) 10 MG tablet Take 10 mg by mouth 3 (three) times daily as needed. As needed for muscle spasms.   Yes Historical Provider, MD  dexamethasone (DECADRON) 4 MG tablet Take 4 mg by mouth daily.   Yes Historical Provider, MD  digoxin (LANOXIN) 0.25 MG tablet Take 250 mcg by mouth daily.   Yes Historical Provider, MD  HYDROcodone-acetaminophen (NORCO) 10-325 MG per tablet Take 1 tablet by mouth every 8 (eight) hours as needed for moderate pain.  05/13/14  Yes Historical Provider, MD   BP 242/191 mmHg  Pulse 58  Temp(Src) 99.1 F (37.3 C) (Oral)  Resp 16  Ht 5\' 5"  (1.651 m)  Wt 130 lb (58.968 kg)  BMI 21.63 kg/m2  SpO2 95% Physical Exam  Constitutional: She is oriented to person, place, and time. She appears well-developed and well-nourished. No distress.  HENT:  Head: Normocephalic and atraumatic.  Eyes: EOM are normal.  Neck: Normal range of motion.  Cardiovascular: Normal rate, regular rhythm and normal heart sounds.   Pulmonary/Chest: Effort normal and breath sounds normal.  Abdominal: Soft.  She exhibits no distension. There is no tenderness.  Musculoskeletal: Normal range of motion.  Neurological: She is alert and oriented to person, place, and time.  Unable to hold right lower extremity against gravity for longer than 2 seconds.  Normal strength in bilateral upper extremities.  Mild dysarthric speech  Skin: Skin is warm and dry.  Psychiatric: She has a normal mood and affect. Judgment normal.  Nursing note and vitals reviewed.   ED Course  Procedures (including critical care time) Labs Review Labs Reviewed  CBC - Abnormal; Notable for the following:    WBC 11.0 (*)    RBC 5.53 (*)    Hemoglobin 17.6 (*)    HCT 48.3 (*)    MCHC 36.4 (*)    All other components within normal limits  DIFFERENTIAL - Abnormal; Notable for the following:     Monocytes Absolute 1.1 (*)    All other components within normal limits  COMPREHENSIVE METABOLIC PANEL - Abnormal; Notable for the following:    Glucose, Bld 115 (*)    Creatinine, Ser 1.05 (*)    GFR calc non Af Amer 56 (*)    All other components within normal limits  URINE RAPID DRUG SCREEN, HOSP PERFORMED - Abnormal; Notable for the following:    Cocaine POSITIVE (*)    All other components within normal limits  URINALYSIS, ROUTINE W REFLEX MICROSCOPIC (NOT AT [facility]) - Abnormal; Notable for the following:    APPearance CLOUDY (*)    All other components within normal limits  I-STAT CHEM 8, ED - Abnormal; Notable for the following:    Glucose, Bld 115 (*)    Calcium, Ion 1.05 (*)    Hemoglobin 17.7 (*)    HCT 52.0 (*)    All other components within normal limits  ETHANOL  PROTIME-INR  APTT  I-STAT TROPOININ, ED    Imaging Review Ct Head Wo Contrast  05/14/2015  CLINICAL DATA:  2-5 day history of headache, blurry vision, slurred speech and right hand and leg pain. EXAM: CT HEAD WITHOUT CONTRAST TECHNIQUE: Contiguous axial images were obtained from the base of the skull through the vertex without intravenous contrast. COMPARISON:  05/24/2014. FINDINGS: There is an area of low attenuation in the central aspect of the left frontal lobe in the ACA territory consistent with a acute or subacute cortical infarct. No hemorrhage. No hemispheric infarction. Remote lacunar type infarct noted in the left caudate area. The brainstem and cerebellum are normal. The bony structures are intact. The paranasal sinuses and mastoid air cells are clear. IMPRESSION: 1. Acute or subacute left ACA territory infarct.  No hemorrhage. 2. Remote lacunar type infarct in the left caudate area. These results were called by telephone at the time of interpretation on 05/14/2015 at 11:11 pm to Dr. Azalia Bilis , who verbally acknowledged these results. Electronically Signed   By: Rudie Meyer M.D.   On: 05/14/2015 23:11    I have personally reviewed and evaluated these images and lab results as part of my medical decision-making.   EKG Interpretation   Date/Time:  Wednesday May 14 2015 21:11:08 EST Ventricular Rate:  68 PR Interval:  146 QRS Duration: 99 QT Interval:  463 QTC Calculation: 492 R Axis:   72 Text Interpretation:  Sinus rhythm Left ventricular hypertrophy Borderline  prolonged QT interval No significant change was found Confirmed by Daphne Karrer   MD, Caryn Bee ([id number]) on 05/14/2015 9:20:28 PM      MDM   Final diagnoses:  Cerebrovascular accident (  CVA), unspecified mechanism (HCC)    Patient with left frontal subacute stroke.  This likely accounts for her speech issues and her right leg weakness.  Neurology consultation.  Hospitalist admission.  Patient with hypertensive urgency with initial blood pressure of 244/90.  Currently her blood pressures 205/84.  I will follow recommendations of neurology for management given the recent subacute stroke and need to tolerate a slightly higher blood pressure.  I imagine 205/84 is high enough to warrant some treatment.  Defer to neurology and hospitalist.    Azalia Bilis, MD 05/15/15 0005

## 2015-05-14 NOTE — ED Notes (Addendum)
Neurology Dr. Roseanne RenoStewart at bedside

## 2015-05-14 NOTE — Consult Note (Signed)
Admission H&P    Chief Complaint: Recurrent falls with right lower extremity weakness.  HPI: [name] is an 59 y.o. female history of hypertension, myocardial infarction, atrial fibrillation, cocaine and alcohol use, presenting with weakness involving her right lower extremity for 2 days with recurrent falls. He is also complaining of dizziness. Patient's alcohol level was 277 in the ED. Urine was positive for cocaine. CT scan of her head showed low density area involving the left frontal region, ACA territory. Patient has no previous history of stroke. She has not been on antiplatelet therapy and has not been compliant with antihypertensive medication.  LSN: 05/12/2015 tPA Given: No: Beyond time under for treatment consideration mRankin:  Past Medical History  Diagnosis Date  . Hypertension   . Complication of anesthesia     Heart attack during surgery  . Myocardial infarction ([place])   . Heart murmur   . Shortness of breath   . Pneumonia     hx of pna  . Dysrhythmia     atrial fibrilation  . GERD (gastroesophageal reflux disease)   . Arthritis     Past Surgical History  Procedure Laterality Date  . Total hip arthroplasty  2010  . Replacement total knee bilateral    . Shoulder arthroscopy w/ rotator cuff repair    . Skin graft    . Abdominal hysterectomy    . Hip surgery  12/09/2011    revision  . Total hip revision  12/09/2011    Procedure: TOTAL HIP REVISION;  Surgeon: Sharmon Revere, MD;  Location: [place];  Service: Orthopedics;  Laterality: Left;    Family History  Problem Relation Age of Onset  . Anesthesia problems Neg Hx    Social History:  reports that she has been smoking.  She has never used smokeless tobacco. She reports that she drinks alcohol. She reports that she uses illicit drugs (Cocaine and Marijuana).  Allergies:  Allergies  Allergen Reactions  . Penicillins Itching   Medications: Patient's preadmission medications were reviewed by  me.  ROS: History obtained from patient and her daughter.  General ROS: negative for - chills, fatigue, fever, night sweats, weight gain or weight loss Psychological ROS: negative for - behavioral disorder, hallucinations, memory difficulties, mood swings or suicidal ideation Ophthalmic ROS: negative for - blurry vision, double vision, eye pain or loss of vision ENT ROS: negative for - epistaxis, nasal discharge, oral lesions, sore throat, tinnitus or vertigo Allergy and Immunology ROS: negative for - hives or itchy/watery eyes Hematological and Lymphatic ROS: negative for - bleeding problems, bruising or swollen lymph nodes Endocrine ROS: negative for - galactorrhea, hair pattern changes, polydipsia/polyuria or temperature intolerance Respiratory ROS: negative for - cough, hemoptysis, shortness of breath or wheezing Cardiovascular ROS: negative for - chest pain, dyspnea on exertion, edema or irregular heartbeat Gastrointestinal ROS: negative for - abdominal pain, diarrhea, hematemesis, nausea/vomiting or stool incontinence Genito-Urinary ROS: negative for - dysuria, hematuria, incontinence or urinary frequency/urgency Musculoskeletal ROS: negative for - joint swelling or muscular weakness Neurological ROS: as noted in HPI Dermatological ROS: negative for rash and skin lesion changes  Physical Examination: Blood pressure 205/84, pulse 74, temperature 99.1 F (37.3 C), temperature source Oral, resp. rate 17, height 5' 5"  (1.651 m), weight 58.968 kg (130 lb), SpO2 95 %.  HEENT-  Normocephalic, no lesions, without obvious abnormality.  Normal external eye and conjunctiva.  Normal TM's bilaterally.  Normal auditory canals and external ears. Normal external nose, mucus membranes and septum.  Normal  pharynx. Neck supple with no masses, nodes, nodules or enlargement. Cardiovascular - regular rate and rhythm and systolic murmur: early systolic 3/6, crescendo at 2nd right intercostal space Lungs -  chest clear, no wheezing, rales, normal symmetric air entry Abdomen - soft, non-tender; bowel sounds normal; no masses,  no organomegaly Extremities - no joint deformities, effusion, or inflammation and no edema  Neurologic Examination: Mental Status: Moderately intoxicated, oriented, thought content appropriate.  Speech slightly slurred without evidence of aphasia. Able to follow commands without difficulty. Cranial Nerves: II-Visual fields were normal. III/IV/VI-Pupils were equal and reacted normally to light. Extraocular movements were full and conjugate.    V/VII-no facial numbness and no facial weakness. VIII-normal. X-mild dysarthria. XI: trapezius strength/neck flexion strength normal bilaterally XII-midline tongue extension with normal strength. Motor: Moderate drift of right lower extremity occipital weakness; cortical fisting of right hand; motor exam otherwise unremarkable Sensory: Reduced perception of tactile sensation over right arm and leg compared to left extremities. Deep Tendon Reflexes: 1+ and symmetric. Plantars: Mute bilaterally Cerebellar: Normal finger-to-nose testing. Carotid auscultation: Normal  Results for orders placed or performed during the hospital encounter of 05/14/15 (from the past 48 hour(s))  Ethanol     Status: None   Collection Time: 05/14/15 10:00 PM  Result Value Ref Range   Alcohol, Ethyl (B) <5 <5 mg/dL    Comment:        LOWEST DETECTABLE LIMIT FOR SERUM ALCOHOL IS 5 mg/dL FOR MEDICAL PURPOSES ONLY   Protime-INR     Status: None   Collection Time: 05/14/15 10:00 PM  Result Value Ref Range   Prothrombin Time 13.5 11.6 - 15.2 seconds   INR 1.01 0.00 - 1.49  APTT     Status: None   Collection Time: 05/14/15 10:00 PM  Result Value Ref Range   aPTT 27 24 - 37 seconds  CBC     Status: Abnormal   Collection Time: 05/14/15 10:00 PM  Result Value Ref Range   WBC 11.0 (H) 4.0 - 10.5 K/uL   RBC 5.53 (H) 3.87 - 5.11 MIL/uL   Hemoglobin 17.6  (H) 12.0 - 15.0 g/dL   HCT 48.3 (H) 36.0 - 46.0 %   MCV 87.3 78.0 - 100.0 fL   MCH 31.8 26.0 - 34.0 pg   MCHC 36.4 (H) 30.0 - 36.0 g/dL   RDW 13.2 11.5 - 15.5 %   Platelets 276 150 - 400 K/uL  Differential     Status: Abnormal   Collection Time: 05/14/15 10:00 PM  Result Value Ref Range   Neutrophils Relative % 59 %   Neutro Abs 6.5 1.7 - 7.7 K/uL   Lymphocytes Relative 30 %   Lymphs Abs 3.3 0.7 - 4.0 K/uL   Monocytes Relative 10 %   Monocytes Absolute 1.1 (H) 0.1 - 1.0 K/uL   Eosinophils Relative 1 %   Eosinophils Absolute 0.1 0.0 - 0.7 K/uL   Basophils Relative 0 %   Basophils Absolute 0.0 0.0 - 0.1 K/uL  Comprehensive metabolic panel     Status: Abnormal   Collection Time: 05/14/15 10:00 PM  Result Value Ref Range   Sodium 136 135 - 145 mmol/L   Potassium 3.6 3.5 - 5.1 mmol/L   Chloride 101 101 - 111 mmol/L   CO2 24 22 - 32 mmol/L   Glucose, Bld 115 (H) 65 - 99 mg/dL   BUN 9 6 - 20 mg/dL   Creatinine, Ser 1.05 (H) 0.44 - 1.00 mg/dL   Calcium 9.6 8.9 -  10.3 mg/dL   Total Protein 7.6 6.5 - 8.1 g/dL   Albumin 3.9 3.5 - 5.0 g/dL   AST 33 15 - 41 U/L   ALT 19 14 - 54 U/L   Alkaline Phosphatase 90 38 - 126 U/L   Total Bilirubin 1.0 0.3 - 1.2 mg/dL   GFR calc non Af Amer 56 (L) >60 mL/min   GFR calc Af Amer >60 >60 mL/min    Comment: (NOTE) The eGFR has been calculated using the CKD EPI equation. This calculation has not been validated in all clinical situations. eGFR's persistently <60 mL/min signify possible Chronic Kidney Disease.    Anion gap 11 5 - 15  Urine rapid drug screen (hosp performed)not at [facility]     Status: Abnormal   Collection Time: 05/14/15 10:00 PM  Result Value Ref Range   Opiates NONE DETECTED NONE DETECTED   Cocaine POSITIVE (A) NONE DETECTED   Benzodiazepines NONE DETECTED NONE DETECTED   Amphetamines NONE DETECTED NONE DETECTED   Tetrahydrocannabinol NONE DETECTED NONE DETECTED   Barbiturates NONE DETECTED NONE DETECTED    Comment:        DRUG  SCREEN FOR MEDICAL PURPOSES ONLY.  IF CONFIRMATION IS NEEDED FOR ANY PURPOSE, NOTIFY LAB WITHIN 5 DAYS.        LOWEST DETECTABLE LIMITS FOR URINE DRUG SCREEN Drug Class       Cutoff (ng/mL) Amphetamine      1000 Barbiturate      200 Benzodiazepine   017 Tricyclics       510 Opiates          300 Cocaine          300 THC              50   Urinalysis, Routine w reflex microscopic (not at [facility])     Status: Abnormal   Collection Time: 05/14/15 10:00 PM  Result Value Ref Range   Color, Urine YELLOW YELLOW   APPearance CLOUDY (A) CLEAR   Specific Gravity, Urine 1.006 1.005 - 1.030   pH 6.5 5.0 - 8.0   Glucose, UA NEGATIVE NEGATIVE mg/dL   Hgb urine dipstick NEGATIVE NEGATIVE   Bilirubin Urine NEGATIVE NEGATIVE   Ketones, ur NEGATIVE NEGATIVE mg/dL   Protein, ur NEGATIVE NEGATIVE mg/dL   Urobilinogen, UA 0.2 0.0 - 1.0 mg/dL   Nitrite NEGATIVE NEGATIVE   Leukocytes, UA NEGATIVE NEGATIVE    Comment: MICROSCOPIC NOT DONE ON URINES WITH NEGATIVE PROTEIN, BLOOD, LEUKOCYTES, NITRITE, OR GLUCOSE <1000 mg/dL.  I-stat troponin, ED (not at [facility], [facility])     Status: None   Collection Time: 05/14/15 10:17 PM  Result Value Ref Range   Troponin i, poc 0.02 0.00 - 0.08 ng/mL   Comment 3            Comment: Due to the release kinetics of cTnI, a negative result within the first hours of the onset of symptoms does not rule out myocardial infarction with certainty. If myocardial infarction is still suspected, repeat the test at appropriate intervals.   I-Stat Chem 8, ED  (not at [facility], [facility])     Status: Abnormal   Collection Time: 05/14/15 10:19 PM  Result Value Ref Range   Sodium 140 135 - 145 mmol/L   Potassium 3.5 3.5 - 5.1 mmol/L   Chloride 102 101 - 111 mmol/L   BUN 11 6 - 20 mg/dL   Creatinine, Ser 1.00 0.44 - 1.00 mg/dL   Glucose, Bld 115 (H) 65 -  99 mg/dL   Calcium, Ion 1.05 (L) 1.13 - 1.30 mmol/L   TCO2 24 0 - 100 mmol/L   Hemoglobin 17.7 (H) 12.0 - 15.0 g/dL   HCT 52.0 (H) 36.0 -  46.0 %   Ct Head Wo Contrast  05/14/2015  CLINICAL DATA:  2-5 day history of headache, blurry vision, slurred speech and right hand and leg pain. EXAM: CT HEAD WITHOUT CONTRAST TECHNIQUE: Contiguous axial images were obtained from the base of the skull through the vertex without intravenous contrast. COMPARISON:  05/24/2014. FINDINGS: There is an area of low attenuation in the central aspect of the left frontal lobe in the ACA territory consistent with a acute or subacute cortical infarct. No hemorrhage. No hemispheric infarction. Remote lacunar type infarct noted in the left caudate area. The brainstem and cerebellum are normal. The bony structures are intact. The paranasal sinuses and mastoid air cells are clear. IMPRESSION: 1. Acute or subacute left ACA territory infarct.  No hemorrhage. 2. Remote lacunar type infarct in the left caudate area. These results were called by telephone at the time of interpretation on 05/14/2015 at 11:11 pm to Dr. Jola Schmidt , who verbally acknowledged these results. Electronically Signed   By: Marijo Sanes M.D.   On: 05/14/2015 23:11    Assessment: 59 y.o. female with multiple risk factors for stroke presenting with acute left ACA territory frontal ischemic infarction.  Stroke Risk Factors - atrial fibrillation, family history and hypertension  Plan: 1. HgbA1c, fasting lipid panel 2. MRI, MRA  of the brain without contrast 3. PT consult, OT consult, Speech consult 4. Echocardiogram 5. Carotid dopplers 6. Prophylactic therapy-Antiplatelet med: Aspirin  7. Risk factor modification 8. Telemetry monitoring  C.R. Nicole Kindred, MD [facility] [phone]  05/14/2015, 11:48 PM

## 2015-05-15 ENCOUNTER — Inpatient Hospital Stay (HOSPITAL_COMMUNITY): Payer: Medicaid Other

## 2015-05-15 ENCOUNTER — Other Ambulatory Visit: Payer: Self-pay

## 2015-05-15 DIAGNOSIS — I63422 Cerebral infarction due to embolism of left anterior cerebral artery: Secondary | ICD-10-CM

## 2015-05-15 DIAGNOSIS — Z720 Tobacco use: Secondary | ICD-10-CM

## 2015-05-15 DIAGNOSIS — I639 Cerebral infarction, unspecified: Secondary | ICD-10-CM | POA: Diagnosis present

## 2015-05-15 DIAGNOSIS — F1410 Cocaine abuse, uncomplicated: Secondary | ICD-10-CM

## 2015-05-15 DIAGNOSIS — D72829 Elevated white blood cell count, unspecified: Secondary | ICD-10-CM | POA: Diagnosis present

## 2015-05-15 DIAGNOSIS — D751 Secondary polycythemia: Secondary | ICD-10-CM | POA: Diagnosis present

## 2015-05-15 DIAGNOSIS — I6789 Other cerebrovascular disease: Secondary | ICD-10-CM

## 2015-05-15 DIAGNOSIS — I10 Essential (primary) hypertension: Secondary | ICD-10-CM | POA: Diagnosis present

## 2015-05-15 DIAGNOSIS — F1010 Alcohol abuse, uncomplicated: Secondary | ICD-10-CM

## 2015-05-15 LAB — LIPID PANEL
CHOL/HDL RATIO: 3.5 ratio
CHOLESTEROL: 211 mg/dL — AB (ref 0–200)
HDL: 60 mg/dL (ref >40)
LDL Cholesterol: 134 mg/dL — ABNORMAL HIGH (ref 0–99)
TRIGLYCERIDES: 85 mg/dL (ref <150)
VLDL: 17 mg/dL (ref 0–40)

## 2015-05-15 LAB — TROPONIN I
TROPONIN I: 0.04 ng/mL — AB (ref <0.031)
Troponin I: <0.03 ng/mL (ref <0.031)

## 2015-05-15 LAB — RAPID HIV SCREEN (HIV 1/2 AB+AG)
HIV 1/2 ANTIBODIES: NONREACTIVE
HIV-1 P24 Antigen - HIV24: NONREACTIVE

## 2015-05-15 MED ORDER — MORPHINE SULFATE (PF) 2 MG/ML IV SOLN: 1.0000 mg | INTRAVENOUS | Status: DC | PRN

## 2015-05-15 MED ORDER — SODIUM CHLORIDE 0.9 % IV SOLN
INTRAVENOUS | Status: DC
  Administered 2015-05-15: 05:00:00 via INTRAVENOUS

## 2015-05-15 MED ORDER — ATORVASTATIN CALCIUM 40 MG PO TABS
40.0000 mg | ORAL_TABLET | Freq: Every day | ORAL | Status: DC
  Administered 2015-05-15 – 2015-05-16 (×2): 40 mg via ORAL
  Filled 2015-05-15 (×3): qty 1

## 2015-05-15 MED ORDER — SENNOSIDES-DOCUSATE SODIUM 8.6-50 MG PO TABS: 1.0000 | ORAL_TABLET | Freq: Every evening | ORAL | Status: DC | PRN

## 2015-05-15 MED ORDER — VITAMIN B-1 100 MG PO TABS
100.0000 mg | ORAL_TABLET | Freq: Every day | ORAL | Status: DC
  Administered 2015-05-15 – 2015-05-17 (×3): 100 mg via ORAL
  Filled 2015-05-15 (×3): qty 1

## 2015-05-15 MED ORDER — FOLIC ACID 1 MG PO TABS
1.0000 mg | ORAL_TABLET | Freq: Every day | ORAL | Status: DC
  Administered 2015-05-15 – 2015-05-17 (×3): 1 mg via ORAL
  Filled 2015-05-15 (×3): qty 1

## 2015-05-15 MED ORDER — LORAZEPAM 1 MG PO TABS
1.0000 mg | ORAL_TABLET | Freq: Four times a day (QID) | ORAL | Status: DC | PRN
  Administered 2015-05-17: 1 mg via ORAL
  Filled 2015-05-15: qty 1

## 2015-05-15 MED ORDER — HYDROCODONE-ACETAMINOPHEN 10-325 MG PO TABS
1.0000 | ORAL_TABLET | Freq: Three times a day (TID) | ORAL | Status: DC | PRN
  Administered 2015-05-15: 1 via ORAL
  Filled 2015-05-15 (×2): qty 1

## 2015-05-15 MED ORDER — ASPIRIN 325 MG PO TABS
325.0000 mg | ORAL_TABLET | Freq: Every day | ORAL | Status: DC
  Administered 2015-05-15 – 2015-05-16 (×2): 325 mg via ORAL
  Filled 2015-05-15 (×3): qty 1

## 2015-05-15 MED ORDER — ADULT MULTIVITAMIN W/MINERALS CH
1.0000 | ORAL_TABLET | Freq: Every day | ORAL | Status: DC
  Administered 2015-05-15 – 2015-05-17 (×3): 1 via ORAL
  Filled 2015-05-15 (×3): qty 1

## 2015-05-15 MED ORDER — DIGOXIN 125 MCG PO TABS
250.0000 ug | ORAL_TABLET | Freq: Every day | ORAL | Status: DC
  Administered 2015-05-15 – 2015-05-17 (×3): 250 ug via ORAL
  Filled 2015-05-15 (×3): qty 2

## 2015-05-15 MED ORDER — ASPIRIN 300 MG RE SUPP: 300.0000 mg | Freq: Every day | RECTAL | Status: DC

## 2015-05-15 MED ORDER — MAGNESIUM SULFATE 2 GM/50ML IV SOLN
2.0000 g | Freq: Once | INTRAVENOUS | Status: AC
  Administered 2015-05-15: 2 g via INTRAVENOUS
  Filled 2015-05-15: qty 50

## 2015-05-15 MED ORDER — LORAZEPAM 2 MG/ML IJ SOLN: 1.0000 mg | Freq: Four times a day (QID) | INTRAMUSCULAR | Status: DC | PRN

## 2015-05-15 MED ORDER — POTASSIUM CHLORIDE CRYS ER 20 MEQ PO TBCR
40.0000 meq | EXTENDED_RELEASE_TABLET | Freq: Once | ORAL | Status: AC
  Administered 2015-05-15: 40 meq via ORAL
  Filled 2015-05-15: qty 2

## 2015-05-15 MED ORDER — SODIUM CHLORIDE 0.9 % IV SOLN
2.0000 g | Freq: Once | INTRAVENOUS | Status: AC
  Administered 2015-05-15: 2 g via INTRAVENOUS
  Filled 2015-05-15: qty 20

## 2015-05-15 MED ORDER — STROKE: EARLY STAGES OF RECOVERY BOOK
Freq: Once | Status: AC
Start: 2015-05-15 — End: 2015-05-15
  Administered 2015-05-15: 01:00:00
  Filled 2015-05-15: qty 1

## 2015-05-15 MED ORDER — CYCLOBENZAPRINE HCL 10 MG PO TABS
10.0000 mg | ORAL_TABLET | Freq: Three times a day (TID) | ORAL | Status: DC | PRN
  Administered 2015-05-15 – 2015-05-17 (×2): 10 mg via ORAL
  Filled 2015-05-15 (×2): qty 1

## 2015-05-15 MED ORDER — ENOXAPARIN SODIUM 40 MG/0.4ML ~~LOC~~ SOLN
40.0000 mg | SUBCUTANEOUS | Status: DC
  Administered 2015-05-15 – 2015-05-17 (×3): 40 mg via SUBCUTANEOUS
  Filled 2015-05-15 (×3): qty 0.4

## 2015-05-15 MED ORDER — NICOTINE 14 MG/24HR TD PT24
14.0000 mg | MEDICATED_PATCH | Freq: Every day | TRANSDERMAL | Status: DC
  Administered 2015-05-15 – 2015-05-17 (×3): 14 mg via TRANSDERMAL
  Filled 2015-05-15 (×3): qty 1

## 2015-05-15 MED ORDER — THIAMINE HCL 100 MG/ML IJ SOLN
100.0000 mg | Freq: Every day | INTRAMUSCULAR | Status: DC
  Filled 2015-05-15: qty 2

## 2015-05-15 MED ORDER — DEXAMETHASONE 4 MG PO TABS
4.0000 mg | ORAL_TABLET | Freq: Every day | ORAL | Status: DC
  Administered 2015-05-15 – 2015-05-17 (×3): 4 mg via ORAL
  Filled 2015-05-15 (×3): qty 1

## 2015-05-15 NOTE — Progress Notes (Signed)
Echocardiogram 2D Echocardiogram has been performed.  Melanie BasemanReel, [name] 05/15/2015, 9:52 AM

## 2015-05-15 NOTE — NC FL2 (Signed)
[place] MEDICAID FL2 LEVEL OF CARE SCREENING TOOL     IDENTIFICATION  Patient Name: [name] Birthdate: March 24, 1956 Sex: female Admission Date (Current Location): 05/14/2015  [facility] [place]   Facility and Address:  [facility] [place], [address], [place], [place] [address]      Provider Number: [id number]  Attending Physician Name and Address:  Maretta BeesShanker M Ghimire, MD  Relative Name and Phone Number:       Current Level of Care: Hospital Recommended Level of Care: Skilled Nursing Facility Prior Approval Number:    Date Approved/Denied:   PASRR Number: [id number]  Discharge Plan: SNF    Current Diagnoses: Patient Active Problem List   Diagnosis Date Noted  . CVA (cerebral infarction) 05/15/2015  . Tobacco abuse 05/15/2015  . Alcohol abuse 05/15/2015  . Cocaine abuse 05/15/2015  . Accelerated hypertension 05/15/2015  . Leukocytosis 05/15/2015  . Polycythemia 05/15/2015  . Hypocalcemia 05/15/2015  . Stroke [facility](HCC) 05/14/2015    Orientation ACTIVITIES/SOCIAL BLADDER RESPIRATION    Self, Time, Situation, Place  Family supportive Continent Normal  BEHAVIORAL SYMPTOMS/MOOD NEUROLOGICAL BOWEL NUTRITION STATUS      Continent Diet (see DC summary)  PHYSICIAN VISITS COMMUNICATION OF NEEDS Height & Weight Skin    Verbally   135 lbs. Normal          AMBULATORY STATUS RESPIRATION    Assist extensive Normal      Personal Care Assistance Level of Assistance  Dressing, Bathing Bathing Assistance: Limited assistance   Dressing Assistance: Limited assistance      Functional Limitations Info                SPECIAL CARE FACTORS FREQUENCY  PT (By licensed PT)     PT Frequency: 5/wk             Additional Factors Info  Code Status, Allergies Code Status Info: FULL Allergies Info: Penicillins           Current Medications (05/15/2015): Current Facility-Administered Medications  Medication Dose  Route Frequency Provider Last Rate Last Dose  . aspirin suppository 300 mg  300 mg Rectal Daily Clydie Braunondell A Smith, MD       Or  . aspirin tablet 325 mg  325 mg Oral Daily Clydie Braunondell A Smith, MD   325 mg at 05/15/15 1102  . atorvastatin (LIPITOR) tablet 40 mg  40 mg Oral q1800 Maretta BeesShanker M Ghimire, MD      . cyclobenzaprine (FLEXERIL) tablet 10 mg  10 mg Oral TID PRN Clydie Braunondell A Smith, MD      . dexamethasone (DECADRON) tablet 4 mg  4 mg Oral Daily Clydie Braunondell A Smith, MD   4 mg at 05/15/15 1101  . digoxin (LANOXIN) tablet 250 mcg  250 mcg Oral Daily Clydie Braunondell A Smith, MD   250 mcg at 05/15/15 1101  . enoxaparin (LOVENOX) injection 40 mg  40 mg Subcutaneous Q24H Rondell A Katrinka BlazingSmith, MD   40 mg at 05/15/15 1102  . folic acid (FOLVITE) tablet 1 mg  1 mg Oral Daily Rondell Burtis JunesA Smith, MD   1 mg at 05/15/15 1102  . hydrALAZINE (APRESOLINE) injection 10 mg  10 mg Intravenous Q6H PRN Clydie Braunondell A Smith, MD   10 mg at 05/15/15 1316  . HYDROcodone-acetaminophen (NORCO) 10-325 MG per tablet 1 tablet  1 tablet Oral Q8H PRN Clydie Braunondell A Smith, MD   1 tablet at 05/15/15 1428  . LORazepam (ATIVAN) tablet 1 mg  1 mg Oral Q6H  PRN Clydie Braun, MD       Or  . LORazepam (ATIVAN) injection 1 mg  1 mg Intravenous Q6H PRN Clydie Braun, MD      . magnesium sulfate IVPB 2 g 50 mL  2 g Intravenous Once Maretta Bees, MD   2 g at 05/15/15 1601  . morphine 2 MG/ML injection 1-2 mg  1-2 mg Intravenous Q4H PRN Maretta Bees, MD      . multivitamin with minerals tablet 1 tablet  1 tablet Oral Daily Clydie Braun, MD   1 tablet at 05/15/15 1101  . nicotine (NICODERM CQ - dosed in mg/24 hours) patch 14 mg  14 mg Transdermal Daily Clydie Braun, MD   14 mg at 05/15/15 1102  . senna-docusate (Senokot-S) tablet 1 tablet  1 tablet Oral QHS PRN Clydie Braun, MD      . thiamine (VITAMIN B-1) tablet 100 mg  100 mg Oral Daily Rondell A Katrinka Blazing, MD   100 mg at 05/15/15 1102   Or  . thiamine (B-1) injection 100 mg  100 mg Intravenous Daily  Clydie Braun, MD       Do not use this list as official medication orders. Please verify with discharge summary.  Discharge Medications:   Medication List    ASK your doctor about these medications        carvedilol 12.5 MG tablet  Commonly known as:  COREG  Take 12.5 mg by mouth 2 (two) times daily with a meal.     cyclobenzaprine 10 MG tablet  Commonly known as:  FLEXERIL  Take 10 mg by mouth 3 (three) times daily as needed. As needed for muscle spasms.     dexamethasone 4 MG tablet  Commonly known as:  DECADRON  Take 4 mg by mouth daily.     digoxin 0.25 MG tablet  Commonly known as:  LANOXIN  Take 250 mcg by mouth daily.     HYDROcodone-acetaminophen 10-325 MG tablet  Commonly known as:  NORCO  Take 1 tablet by mouth every 8 (eight) hours as needed for moderate pain.        Relevant Imaging Results:  Relevant Lab Results:  Recent Labs    Additional Information    Izora Ribas, LCSW

## 2015-05-15 NOTE — H&P (Signed)
[facility] History and Physical  Brucha Ahlquist WNU:[id number] DOB: [birth year] DOA: 05/14/2015  Referring physician: ED PCP: Evlyn Courier, MD   Chief Complaint: Slurred speech and falls  HPI:  Patient is a right-hand dominant 59 year old female who has a past medical history significant for HTN, atrial fibrillation, MI, tobacco abuse, and polysubstance abuse; who presented with complaints of slurred speech and recurrent falls. Patient is present with her and provides additional history. The patient herself is somewhat of a poor historian as she is vague on specific details. Patient states the symptoms started anywhere from 2 days - 1 week ago. Patient states that about 1 week ago she started having issues with her right lower extremity turning inward. Ms. Storr and states that she's been falling frequently during this time, but cannot quantify. In the last 2 days she noted more significant difficulty in getting her words out and slurred speech.   Her daughter states that she called her this evening from [place] and was able to tell him that something was not right with her speech and she was having difficulty getting her words out. She called EMS at that time. Daughter notes that she noticed that her right side of her face is been drooping  Some and she's had decreased sensation on that side as Upon the patient's daughter's main concern is that she lives alone and likely cannot continue to do so at this time and her current state.   Upon arrival into the emergency department initial CT scan showed signs of an acute or subacute left ACA territory infarct, and a remote lacunar type infarct in the left caudate area. Stroke pager was fired and neurology evaluated the patient. Initial lab work including UDS was positive for cocaine and blood alcohol level was less than 5. With family out of the room patient admits she last used cocaine yesterday.    Review of Sy stems  HENT:  Negative for congestion and ear pain.   Eyes: Positive for blurred vision. Negative for double vision.  Respiratory: Positive for shortness of breath. Negative for hemoptysis.   Cardiovascular: Positive for chest pain. Negative for leg swelling.  Gastrointestinal: Negative for nausea, vomiting and abdominal pain.  Genitourinary: Negative for frequency and hematuria.  Musculoskeletal: Positive for joint pain and falls.  Neurological: Positive for sensory change, speech change, focal weakness and weakness.  Endo/Heme/Allergies: Negative for polydipsia. Does not bruise/bleed easily.  Psychiatric/Behavioral: Positive for substance abuse. Negative for suicidal ideas.       Past Medical History  Diagnosis Date  . Hypertension   . Complication of anesthesia     Heart attack during surgery  . Myocardial infarction (HCC)   . Heart murmur   . Shortness of breath   . Pneumonia     hx of pna  . Dysrhythmia     atrial fibrilation  . GERD (gastroesophageal reflux disease)   . Arthritis      Past Surgical History  Procedure Laterality Date  . Total hip arthroplasty  2010  . Replacement total knee bilateral    . Shoulder arthroscopy w/ rotator cuff repair    . Skin graft    . Abdominal hysterectomy    . Hip surgery  12/09/2011    revision  . Total hip revision  12/09/2011    Procedure: TOTAL HIP REVISION;  Surgeon: Kennieth Rad, MD;  Location: [facility];  Service: Orthopedics;  Laterality: Left;      Social History:  reports that she  has been smoking.  She has never used smokeless tobacco. She reports that she drinks alcohol. She reports that she uses illicit drugs (Cocaine and Marijuana). Where does patient live--home    and with whom if at home? Alone Can patient participate in ADLs? Previously yes  Allergies  Allergen Reactions  . Penicillins Itching    Family History  Problem Relation Age of Onset  . Anesthesia problems Neg Hx      Prior to Admission medications    Medication Sig Start Date End Date Taking? Authorizing Provider  carvedilol (COREG) 12.5 MG tablet Take 12.5 mg by mouth 2 (two) times daily with a meal.   Yes Historical Provider, MD  cyclobenzaprine (FLEXERIL) 10 MG tablet Take 10 mg by mouth 3 (three) times daily as needed. As needed for muscle spasms.   Yes Historical Provider, MD  dexamethasone (DECADRON) 4 MG tablet Take 4 mg by mouth daily.   Yes Historical Provider, MD  digoxin (LANOXIN) 0.25 MG tablet Take 250 mcg by mouth daily.   Yes Historical Provider, MD  HYDROcodone-acetaminophen (NORCO) 10-325 MG per tablet Take 1 tablet by mouth every 8 (eight) hours as needed for moderate pain.  05/13/14  Yes Historical Provider, MD     Physical Exam: Filed Vitals:   05/15/15 0000 05/15/15 0010 05/15/15 0041 05/15/15 0154  BP: 226/74 179/87 198/88 142/92  Pulse: 68 85 81 67  Temp:   98 F (36.7 C) 98.2 F (36.8 C)  TempSrc:   Oral Oral  Resp: 22 18 20 18   Height:   5\' 5"  (1.651 m)   Weight:   61.326 kg (135 lb 3.2 oz)   SpO2: 94% 97% 98% 99%     Constitutional: Vital signs reviewed. Patient is a well-developed and well-nourished in no acute distress and cooperative with exam. Alert and oriented x3.  Head: Normocephalic and atraumatic  Ear: TM normal bilaterally  Mouth: no erythema or exudates, MMM  Eyes: PERRL, EOMI, conjunctivae normal, No scleral icterus.  Neck: Supple, Trachea midline normal ROM, No JVD, mass, thyromegaly, or carotid bruit present. Patient reports some mild tenderness to palpation of the posterior cervical region  Cardiovascular: RRR, S1 normal, S2 normal, no MRG, pulses symmetric and intact bilaterally  Pulmonary/Chest: CTAB, no wheezes, rales, or rhonchi  Abdominal: Soft. Non-tender, non-distended, bowel sounds are normal, no masses, organomegaly, or guarding present.  GU: no CVA tenderness Musculoskeletal: No joint deformities, erythema, or stiffness, ROM full and no nontender Ext: no edema and no cyanosis,  pulses palpable bilaterally (DP and PT)  Hematology: no cervical, inginal, or axillary adenopathy.  Neurological:  Patient with  right upper and lower extremity drift( most significantly seen in the right leg) with strength being 4 out of 5. Left upper and lower extremity strength 5 out of 5. Mild signs of a right-sided facial droop. The dysarthric speech with signs of difficulty word finding. Patient really reports perceived decreased sensation on left lower extremity Skin: Warm, dry and intact. No rash, cyanosis, or clubbing.  Psychiatric: Normal mood and affect. Judgment and thought content normal. Cognition and memory are normal.      Data Review   Micro Results No results found for this or any previous visit (from the past 240 hour(s)).  Radiology Reports Ct Head Wo Contrast  05/14/2015  CLINICAL DATA:  2-5 day history of headache, blurry vision, slurred speech and right hand and leg pain. EXAM: CT HEAD WITHOUT CONTRAST TECHNIQUE: Contiguous axial images were obtained from the base of  the skull through the vertex without intravenous contrast. COMPARISON:  05/24/2014. FINDINGS: There is an area of low attenuation in the central aspect of the left frontal lobe in the ACA territory consistent with a acute or subacute cortical infarct. No hemorrhage. No hemispheric infarction. Remote lacunar type infarct noted in the left caudate area. The brainstem and cerebellum are normal. The bony structures are intact. The paranasal sinuses and mastoid air cells are clear. IMPRESSION: 1. Acute or subacute left ACA territory infarct.  No hemorrhage. 2. Remote lacunar type infarct in the left caudate area. These results were called by telephone at the time of interpretation on 05/14/2015 at 11:11 pm to Dr. Azalia Bilis , who verbally acknowledged these results. Electronically Signed   By: Rudie Meyer M.D.   On: 05/14/2015 23:11     CBC  Recent Labs Lab 05/14/15 2200 05/14/15 2219  WBC 11.0*  --   HGB  17.6* 17.7*  HCT 48.3* 52.0*  PLT 276  --   MCV 87.3  --   MCH 31.8  --   MCHC 36.4*  --   RDW 13.2  --   LYMPHSABS 3.3  --   MONOABS 1.1*  --   EOSABS 0.1  --   BASOSABS 0.0  --     Chemistries   Recent Labs Lab 05/14/15 2200 05/14/15 2219  NA 136 140  K 3.6 3.5  CL 101 102  CO2 24  --   GLUCOSE 115* 115*  BUN 9 11  CREATININE 1.05* 1.00  CALCIUM 9.6  --   AST 33  --   ALT 19  --   ALKPHOS 90  --   BILITOT 1.0  --    ------------------------------------------------------------------------------------------------------------------ estimated creatinine clearance is 53.2 mL/min (by C-G formula based on Cr of 1). ------------------------------------------------------------------------------------------------------------------ No results for input(s): HGBA1C in the last 72 hours. ------------------------------------------------------------------------------------------------------------------ No results for input(s): CHOL, HDL, LDLCALC, TRIG, CHOLHDL, LDLDIRECT in the last 72 hours. ------------------------------------------------------------------------------------------------------------------ No results for input(s): TSH, T4TOTAL, T3FREE, THYROIDAB in the last 72 hours.  Invalid input(s): FREET3 ------------------------------------------------------------------------------------------------------------------ No results for input(s): VITAMINB12, FOLATE, FERRITIN, TIBC, IRON, RETICCTPCT in the last 72 hours.  Coagulation profile  Recent Labs Lab 05/14/15 2200  INR 1.01    No results for input(s): DDIMER in the last 72 hours.  Cardiac Enzymes No results for input(s): CKMB, TROPONINI, MYOGLOBIN in the last 168 hours.  Invalid input(s): CK ------------------------------------------------------------------------------------------------------------------ Invalid input(s): POCBNP   CBG: No results for input(s): GLUCAP in the last 168 hours.     EKG:  Independently reviewed. Sinus rhythm with LVH and  prolonged QTC of 494   Assessment/Plan Principal Problem:    CVA (cerebral infarction): Patient with anywhere from a 2 day to one week history of stroke symptoms including right lower extremity weakness. CT scan positive for an acute or subacute left ACA territory stroke. -Neurology consulted  -telemetry bed. Patient had a borderline prolonged QTC. Monitoring for possible arrhythmias -Checking lipid panel,  hemoglobin A1c -MRI/MRA/carotid duplex Doppler/echo ordered  -PT/OT /SP consults  -Bedside Swallow eval to take and if patient passes place on a heart healthy diet -social consult for possible need of rehab placement if the wound is not safe for patient to continue living alone  Accelerated hypertension: Uncontrolled at this time will allow for permissive hypertension. Patient's recent cocaine use, findings of a acute /subacute stroke, and/or noncompliance with medications as possible causes. -Held Coreg -Allowing permissive hypertension at this time prn Hydralazine SBP> 220 or DBP >120  Cocaine abuse: Patient reports last use approximately one day ago as UDS results positive. Patient reports  that she smokes the cocaine.  -Social work consult for help with drug abuse -Checking for HIV/ acute hepatitis panel  Leukocytosis mild elevation to 11. This could be due to an acute stress response versus possible infection -Checking a chest x-ray as not already preformed - rechecking CBC in am  Polycythemia hemoglobin and hematocrit significantly elevated at 17.6 and 48.3. After review of patient's previous CBCs and this could possibly just be secondary to hemoconcentration. -Patient with some gentle IV fluid hydration normal saline at 100 mL/h as patient has some elevated blood pressure -recheck CBC    Tobacco abuse -Nicotine patch offered    Acohol Abuse: Patient has a significant history of alcohol use. Alcohol level <5 on admission.  On physical exam today patient has symptoms that could be suggestive of withdrawal including hypertension.  -Will place the patient on an CiWA protocol for withdrawal. -Will give thiamine and folate  Hypocalcemia:  Acute. ionized calcium low at 1.05 -2 g of calcium gluconate 1 dose now  .  Code Status:   full Family Communication: bedside Disposition Plan: admit   Total time spent 55 minutes.Greater than 50% of this time was spent in counseling, explanation of diagnosis, planning of further management, and coordination of care  Clydie Braun [facility] Pager [phone]  If 7PM-7AM, please contact night-coverage [email or web address] Password [email or web address] 05/15/2015, 2:55 AM

## 2015-05-15 NOTE — Clinical Social Work Note (Signed)
Clinical Social Work Assessment  Patient Details  Name: [name] MRN: [id number] Date of Birth: 09/18/1955  Date of referral:  05/15/15               Reason for consult:  Facility Placement                Permission sought to share information with:  Facility Medical sales representativeContact Representative, Family Supports Permission granted to share information::  Yes, Verbal Permission Granted  Name::     Research scientist (physical sciences)[name]  Agency::  SNFs  Relationship::  Daughter  Contact Information:     Housing/Transportation Living arrangements for the past 2 months:  Single Family Home Source of Information:  Patient, Adult Children Patient Interpreter Needed:  None Criminal Activity/Legal Involvement Pertinent to Current Situation/Hospitalization:  No - Comment as needed Significant Relationships:  Adult Children Lives with:  Self Do you feel safe going back to the place where you live?  Yes Need for family participation in patient care:  No (Coment)  Care giving concerns:  Pt lives at home alone and has no family who lives in this [place]ad   Office managerocial Worker assessment / plan:  CSW spoke with pt and pt dtr concerning potential need for SNF following a stroke.  Employment status:    Insurance information:  [place] PT Recommendations:  Skilled Nursing Facility Information / Referral to community resources:  Skilled Nursing Facility  Patient/Family's Response to care:  Patient is agreeable to SNF if PT recommends it- [facility]  Pt expresses preference for being placed in [place] where her daughter and her wife live.    Pt also expresses concerns about losing her check when she goes to SNF- concerned about being able to pay bills/rent if she no longer has her government assistance.  Patient/Family's Understanding of and Emotional Response to Diagnosis, Current Treatment, and Prognosis:  Pt does not express any questions or concerns at this time- pt dtr seems to be  very realistic about pt condition and potential long term deficits.  Emotional Assessment Appearance:  Appears stated age Attitude/Demeanor/Rapport:    Affect (typically observed):  Appropriate Orientation:  Oriented to Self, Oriented to Place, Oriented to  Time, Oriented to Situation Alcohol / Substance use:  Illicit Drugs, Alcohol Use Psych involvement (Current and /or in the community):  No (Comment)  Discharge Needs  Concerns to be addressed:  Care Coordination Readmission within the last 30 days:  No Current discharge risk:  Substance Abuse, Physical Impairment Barriers to Discharge:  Continued Medical Work up   [employer], Melanie Iacobucci M, LCSW 05/15/2015, 4:30 PM

## 2015-05-15 NOTE — Progress Notes (Signed)
UR Completed Yicel Shannon Graves-Bigelow, RN,BSN [phone]  

## 2015-05-15 NOTE — Progress Notes (Addendum)
PATIENT DETAILS Name: [name] Age: 59 y.o. Sex: female Date of Birth: 03-04-1956 Admit Date: 05/14/2015 Admitting Physician Clydie Braun, MD ZOX:WRUE,AVWUJW K, MD  Subjective: Speech seems to have improved, is awake and alert.  Assessment/Plan: Principal Problem: Acute/subacute CVA: Suspect embolic given history of atrial fibrillation, but also in the setting of ongoing cocaine abuse. Speech slightly dysarthric but seems to have improved, right upper and lower extremity around 4/5. Await echo and carotid Doppler, MRA shows significant intracranial atherosclerotic disease. For now continue with aspirin, await neurology recommendations regarding timing of starting appropriate antiplatelets or anticoagulation. LDL 134 (goal < 70)-start Lipitor, A1c is pending.  Active Problems: Reported history of paroxysmal atrial fibrillation:CHA2DS2-VASc score of 5 Suspect will need to be started on anticoagulation given CVA-await Neuro input. Check digoxin levels. Not a great candidate for beta blockers given ongoing cocaine use-may need to be switched to Cardizem for rate control.  Essential hypertension: Continue to hold Coreg-although permissive hypertension in the setting of acute CVA.  Dyslipidemia: See above  Mild polycythemia: Suspect in a setting of hemoconcentration-patient has been hydrated-recheck CBC in a.m. On antiplatelets. If persist-then will consider further workup.  Tobacco abuse: Counseled-continue transdermal nicotine  Cocaine abuse: Counseled-watch for withdrawal.  EtOH abuse: Drinks 40 ounces of beer on a daily basis-no signs of withdrawal-follow-continue Ativan per protocol.  Chronic Steroid JXB:JYNWGNF on it for arthritis-per patient on decadron for 6 months-will defer taper to outpatient setting.  Disposition: Remain inpatient  Antimicrobial agents  See below  Anti-infectives    None      DVT Prophylaxis: Prophylactic Lovenox   Code  Status: Full code   Family Communication None at bedside  Procedures: None  CONSULTS:  neurology  Time spent 30 minutes-Greater than 50% of this time was spent in counseling, explanation of diagnosis, planning of further management, and coordination of care.  MEDICATIONS: Scheduled Meds: . aspirin  300 mg Rectal Daily   Or  . aspirin  325 mg Oral Daily  . dexamethasone  4 mg Oral Daily  . digoxin  250 mcg Oral Daily  . enoxaparin (LOVENOX) injection  40 mg Subcutaneous Q24H  . folic acid  1 mg Oral Daily  . multivitamin with minerals  1 tablet Oral Daily  . nicotine  14 mg Transdermal Daily  . thiamine  100 mg Oral Daily   Or  . thiamine  100 mg Intravenous Daily   Continuous Infusions: . sodium chloride 100 mL/hr at 05/15/15 0441   PRN Meds:.cyclobenzaprine, hydrALAZINE, HYDROcodone-acetaminophen, LORazepam **OR** LORazepam, senna-docusate    PHYSICAL EXAM: Vital signs in last 24 hours: Filed Vitals:   05/15/15 0041 05/15/15 0154 05/15/15 0400 05/15/15 0556  BP: 198/88 142/92 179/70 175/68  Pulse: 81 67 71 61  Temp: 98 F (36.7 C) 98.2 F (36.8 C) 98.4 F (36.9 C) 97.9 F (36.6 C)  TempSrc: Oral Oral Oral Oral  Resp: 20 18 18 18   Height: 5\' 5"  (1.651 m)     Weight: 61.326 kg (135 lb 3.2 oz)     SpO2: 98% 99% 97% 100%    Weight change:  Filed Weights   05/14/15 2109 05/15/15 0041  Weight: 58.968 kg (130 lb) 61.326 kg (135 lb 3.2 oz)   Body mass index is 22.5 kg/(m^2).   Gen Exam: Awake and alert with mild dysarthric speech.   Neck: Supple, No JVD.   Chest: B/L Clear.   CVS: S1  S2 Regular, no murmurs.  Abdomen: soft, BS +, non tender, non distended.  Extremities: no edema, lower extremities warm to touch. Neurologic: Right upper extremity/right lower extremity 4/5  Skin: No Rash.   Wounds: N/A.    Intake/Output from previous day:  Intake/Output Summary (Last 24 hours) at 05/15/15 1008 Last data filed at 05/15/15 1004  Gross per 24 hour   Intake      0 ml  Output   1275 ml  Net  -1275 ml     LAB RESULTS: CBC  Recent Labs Lab 05/14/15 2200 05/14/15 2219  WBC 11.0*  --   HGB 17.6* 17.7*  HCT 48.3* 52.0*  PLT 276  --   MCV 87.3  --   MCH 31.8  --   MCHC 36.4*  --   RDW 13.2  --   LYMPHSABS 3.3  --   MONOABS 1.1*  --   EOSABS 0.1  --   BASOSABS 0.0  --     Chemistries   Recent Labs Lab 05/14/15 2200 05/14/15 2219  NA 136 140  K 3.6 3.5  CL 101 102  CO2 24  --   GLUCOSE 115* 115*  BUN 9 11  CREATININE 1.05* 1.00  CALCIUM 9.6  --     CBG: No results for input(s): GLUCAP in the last 168 hours.  GFR Estimated Creatinine Clearance: 53.2 mL/min (by C-G formula based on Cr of 1).  Coagulation profile  Recent Labs Lab 05/14/15 2200  INR 1.01    Cardiac Enzymes No results for input(s): CKMB, TROPONINI, MYOGLOBIN in the last 168 hours.  Invalid input(s): CK  Invalid input(s): POCBNP No results for input(s): DDIMER in the last 72 hours. No results for input(s): HGBA1C in the last 72 hours.  Recent Labs  05/15/15 0630  CHOL 211*  HDL 60  LDLCALC 134*  TRIG 85  CHOLHDL 3.5   No results for input(s): TSH, T4TOTAL, T3FREE, THYROIDAB in the last 72 hours.  Invalid input(s): FREET3 No results for input(s): VITAMINB12, FOLATE, FERRITIN, TIBC, IRON, RETICCTPCT in the last 72 hours. No results for input(s): LIPASE, AMYLASE in the last 72 hours.  Urine Studies No results for input(s): UHGB, CRYS in the last 72 hours.  Invalid input(s): UACOL, UAPR, USPG, UPH, UTP, UGL, UKET, UBIL, UNIT, UROB, ULEU, UEPI, UWBC, URBC, UBAC, CAST, UCOM, BILUA  MICROBIOLOGY: No results found for this or any previous visit (from the past 240 hour(s)).  RADIOLOGY STUDIES/RESULTS: Dg Chest 2 View  05/15/2015  CLINICAL DATA:  59 year old female with a history of stroke, weakness EXAM: CHEST - 2 VIEW COMPARISON:  08/12/2014 FINDINGS: Cardiomediastinal silhouette unchanged. Atherosclerotic calcifications of  the aortic arch. Stigmata of emphysema, with increased retrosternal airspace, flattened hemidiaphragms, increased AP diameter, and hyperinflation on the AP view. Similar appearance of interstitial opacities with no confluent airspace disease. No pleural effusion or pneumothorax. No interlobular septal thickening. Degenerative changes of the spine. Unremarkable appearance of the upper abdomen. IMPRESSION: Emphysema and chronic lung scarring with no definite evidence of superimposed acute cardiopulmonary disease. Signed, Yvone Neu. Loreta Ave, DO [facility] Radiology Electronically Signed   By: Gilmer Mor D.O.   On: 05/15/2015 06:59   Ct Head Wo Contrast  05/14/2015  CLINICAL DATA:  2-5 day history of headache, blurry vision, slurred speech and right hand and leg pain. EXAM: CT HEAD WITHOUT CONTRAST TECHNIQUE: Contiguous axial images were obtained from the base of the skull through the vertex without intravenous contrast. COMPARISON:  05/24/2014. FINDINGS: There is an area of low attenuation in the central aspect of the left frontal lobe in the ACA territory consistent with a acute or subacute cortical infarct. No hemorrhage. No hemispheric infarction. Remote lacunar type infarct noted in the left caudate area. The brainstem and cerebellum are normal. The bony structures are intact. The paranasal sinuses and mastoid air cells are clear. IMPRESSION: 1. Acute or subacute left ACA territory infarct.  No hemorrhage. 2. Remote lacunar type infarct in the left caudate area. These results were called by telephone at the time of interpretation on 05/14/2015 at 11:11 pm to Dr. Azalia Bilis , who verbally acknowledged these results. Electronically Signed   By: Rudie Meyer M.D.   On: 05/14/2015 23:11   Mr Brain Wo Contrast  05/15/2015  CLINICAL DATA:  Initial evaluation for right lower extremity weakness. EXAM: MRI HEAD WITHOUT CONTRAST MRA HEAD WITHOUT CONTRAST TECHNIQUE:  Multiplanar, multiecho pulse sequences of the brain and surrounding structures were obtained without intravenous contrast. Angiographic images of the head were obtained using MRA technique without contrast. COMPARISON:  Prior CT from 05/14/2015. FINDINGS: MRI HEAD FINDINGS Patchy and confluent areas of restricted diffusion seen throughout the parasagittal left frontal lobe extending posteriorly towards the left parietal lobe, consistent with acute left ACA territory infarct. Largest confluent area of restricted diffusion measures 3.2 x 2.0 cm. There is involvement of the anterior left corpus callosum. Associated gyral swelling without significant mass effect. Faint petechial hemorrhage without hemorrhagic transformation. No right cerebral or infratentorial infarcts. Major intravascular flow voids are maintained. No acute or chronic intracranial hemorrhage. Age-related cerebral atrophy with mild chronic microvascular ischemic disease. Remote lacunar infarct within the left basal ganglia/corona radiata. Probable small additional remote lacunar infarct within the left thalamus. Remote cortical infarct within the parasagittal left occipital lobe. No mass lesion, midline shift, or mass effect. No hydrocephalus. No extra-axial fluid collection. Craniocervical junction within normal limits. Mild degenerative spondylolysis within the upper cervical spine. Pituitary gland normal.  No acute abnormality about the orbits. Paranasal sinuses are clear. No mastoid effusion. Inner ear structures within normal limits. Bone marrow signal intensity normal. No scalp soft tissue abnormality. MRA HEAD FINDINGS ANTERIOR CIRCULATION: Visualized distal cervical segments of the internal carotid arteries are patent with antegrade flow. The petrous, cavernous, and supraclinoid segments are patent without high-grade stenosis. Left A1 segment patent. Right A1 segment hypoplastic. Anterior communicating artery normal. Atheromatous irregularity  within the right A2 segment. There is occlusion of the left A2 segment (series 6, image 97). M1 segments opacified without proximal arterial branch occlusion. There is focal moderate stenosis within the distal left M1 segment (series 802, image 14). Distal MCA branches are opacified bilaterally. POSTERIOR CIRCULATION: Left vertebral artery dominant and patent to the vertebrobasilar junction. Diminutive right vertebral artery patent as well. Left posterior inferior cerebral artery patent. Right PICA not visualized. Basilar artery widely patent. Left SCA opacified. Right SCA is faintly visualized on time-of-flight. Right P1 segment widely patent. There is a focal moderate to severe stenosis within the mid right P2 segment (series 803, image 18). Left P1 segment patent. There is apparent occlusion of the proximal left P2 segment. Patent left posterior communicating artery noted. No aneurysm. IMPRESSION: MRI HEAD IMPRESSION: 1. Acute left ACA territory infarct. There is faint petechial hemorrhage without hemorrhagic transformation or significant mass effect. 2. Remote left occipital cortical infarct, with additional remote lacunar infarcts involving the left basal ganglia/corona radiata and left thalamus. 3. Mild chronic small vessel ischemic disease. MRA HEAD  IMPRESSION: 1. Occlusion of the proximal left A2 segment. 2. Occlusion of the proximal left PCA, likely chronic given the chronic left occipital infarct. 3. Focal moderate to severe stenosis within the mid right P2 segment. 4. Focal moderate stenosis within the distal left M1 segment. 5. Multifocal atheromatous irregularity within the right ACA. Electronically Signed   By: Rise Mu M.D.   On: 05/15/2015 04:58   Mr Palma Holter  05/15/2015  CLINICAL DATA:  Initial evaluation for right lower extremity weakness. EXAM: MRI HEAD WITHOUT CONTRAST MRA HEAD WITHOUT CONTRAST TECHNIQUE: Multiplanar, multiecho pulse sequences of the brain and surrounding  structures were obtained without intravenous contrast. Angiographic images of the head were obtained using MRA technique without contrast. COMPARISON:  Prior CT from 05/14/2015. FINDINGS: MRI HEAD FINDINGS Patchy and confluent areas of restricted diffusion seen throughout the parasagittal left frontal lobe extending posteriorly towards the left parietal lobe, consistent with acute left ACA territory infarct. Largest confluent area of restricted diffusion measures 3.2 x 2.0 cm. There is involvement of the anterior left corpus callosum. Associated gyral swelling without significant mass effect. Faint petechial hemorrhage without hemorrhagic transformation. No right cerebral or infratentorial infarcts. Major intravascular flow voids are maintained. No acute or chronic intracranial hemorrhage. Age-related cerebral atrophy with mild chronic microvascular ischemic disease. Remote lacunar infarct within the left basal ganglia/corona radiata. Probable small additional remote lacunar infarct within the left thalamus. Remote cortical infarct within the parasagittal left occipital lobe. No mass lesion, midline shift, or mass effect. No hydrocephalus. No extra-axial fluid collection. Craniocervical junction within normal limits. Mild degenerative spondylolysis within the upper cervical spine. Pituitary gland normal.  No acute abnormality about the orbits. Paranasal sinuses are clear. No mastoid effusion. Inner ear structures within normal limits. Bone marrow signal intensity normal. No scalp soft tissue abnormality. MRA HEAD FINDINGS ANTERIOR CIRCULATION: Visualized distal cervical segments of the internal carotid arteries are patent with antegrade flow. The petrous, cavernous, and supraclinoid segments are patent without high-grade stenosis. Left A1 segment patent. Right A1 segment hypoplastic. Anterior communicating artery normal. Atheromatous irregularity within the right A2 segment. There is occlusion of the left A2 segment  (series 6, image 97). M1 segments opacified without proximal arterial branch occlusion. There is focal moderate stenosis within the distal left M1 segment (series 802, image 14). Distal MCA branches are opacified bilaterally. POSTERIOR CIRCULATION: Left vertebral artery dominant and patent to the vertebrobasilar junction. Diminutive right vertebral artery patent as well. Left posterior inferior cerebral artery patent. Right PICA not visualized. Basilar artery widely patent. Left SCA opacified. Right SCA is faintly visualized on time-of-flight. Right P1 segment widely patent. There is a focal moderate to severe stenosis within the mid right P2 segment (series 803, image 18). Left P1 segment patent. There is apparent occlusion of the proximal left P2 segment. Patent left posterior communicating artery noted. No aneurysm. IMPRESSION: MRI HEAD IMPRESSION: 1. Acute left ACA territory infarct. There is faint petechial hemorrhage without hemorrhagic transformation or significant mass effect. 2. Remote left occipital cortical infarct, with additional remote lacunar infarcts involving the left basal ganglia/corona radiata and left thalamus. 3. Mild chronic small vessel ischemic disease. MRA HEAD IMPRESSION: 1. Occlusion of the proximal left A2 segment. 2. Occlusion of the proximal left PCA, likely chronic given the chronic left occipital infarct. 3. Focal moderate to severe stenosis within the mid right P2 segment. 4. Focal moderate stenosis within the distal left M1 segment. 5. Multifocal atheromatous irregularity within the right ACA. Electronically Signed   By: Sharlet Salina  Phill MyronMcClintock M.D.   On: 05/15/2015 04:58    Jeoffrey MassedGHIMIRE,SHANKER, MD  [facility] Pager:[phone]  If 7PM-7AM, please contact night-coverage [email or web address] Password [email or web address] 05/15/2015, 10:08 AM   LOS: 1 day

## 2015-05-15 NOTE — Progress Notes (Signed)
STROKE TEAM PROGRESS NOTE   HISTORY [name] is a 59 y.o. female w/ PMHx of CAD, GERD, HTN, polysubstance abuse and ?PAF, presents to the ED w/ complaints of weakness of the right lower extremity and recurrent falls for the past 2-3 days. She also admits to associated dizziness. While in the ED, patient was intoxicated (EtOh 277) and UDS positive for cocaine. Initial CT head showed low density region involving left frontal ACA territory. No previous history of CVA, atrial fibrillation history unclear, has never been on anticoagulation. No antiplatelet medications previously and is not compliant w/ HTN medications.   Patient was not administered TPA secondary to being outside window.   SUBJECTIVE (INTERVAL HISTORY) Patient overall feel improve, although still with some right-sided weakness in the upper and lower extremities.    OBJECTIVE Temp:  [97.9 F (36.6 C)-99.1 F (37.3 C)] 97.9 F (36.6 C) (11/10 0556) Pulse Rate:  [58-85] 76 (11/10 1100) Cardiac Rhythm:  [-] Normal sinus rhythm (11/10 0700) Resp:  [15-28] 18 (11/10 0556) BP: (142-244)/(66-191) 197/66 mmHg (11/10 1100) SpO2:  [94 %-100 %] 100 % (11/10 0556) Weight:  [130 lb (58.968 kg)-135 lb 3.2 oz (61.326 kg)] 135 lb 3.2 oz (61.326 kg) (11/10 0041)  CBC:  Recent Labs Lab 05/14/15 2200 05/14/15 2219  WBC 11.0*  --   NEUTROABS 6.5  --   HGB 17.6* 17.7*  HCT 48.3* 52.0*  MCV 87.3  --   PLT 276  --     Basic Metabolic Panel:  Recent Labs Lab 05/14/15 2200 05/14/15 2219  NA 136 140  K 3.6 3.5  CL 101 102  CO2 24  --   GLUCOSE 115* 115*  BUN 9 11  CREATININE 1.05* 1.00  CALCIUM 9.6  --     Lipid Panel:    Component Value Date/Time   CHOL 211* 05/15/2015 0630   TRIG 85 05/15/2015 0630   HDL 60 05/15/2015 0630   CHOLHDL 3.5 05/15/2015 0630   VLDL 17 05/15/2015 0630   LDLCALC 134* 05/15/2015 0630    Urine Drug Screen:    Component Value Date/Time   LABOPIA NONE DETECTED 05/14/2015 2200    COCAINSCRNUR POSITIVE* 05/14/2015 2200   LABBENZ NONE DETECTED 05/14/2015 2200   AMPHETMU NONE DETECTED 05/14/2015 2200   THCU NONE DETECTED 05/14/2015 2200   LABBARB NONE DETECTED 05/14/2015 2200     IMAGING  Dg Chest 2 View  05/15/2015  CLINICAL DATA:  59 year old female with a history of stroke, weakness EXAM: CHEST - 2 VIEW COMPARISON:  08/12/2014 FINDINGS: Cardiomediastinal silhouette unchanged. Atherosclerotic calcifications of the aortic arch. Stigmata of emphysema, with increased retrosternal airspace, flattened hemidiaphragms, increased AP diameter, and hyperinflation on the AP view. Similar appearance of interstitial opacities with no confluent airspace disease. No pleural effusion or pneumothorax. No interlobular septal thickening. Degenerative changes of the spine. Unremarkable appearance of the upper abdomen. IMPRESSION: Emphysema and chronic lung scarring with no definite evidence of superimposed acute cardiopulmonary disease. Signed, Yvone Neu. Loreta Ave, DO [facility] Electronically Signed   By: Gilmer Mor D.O.   On: 05/15/2015 06:59   Ct Head Wo Contrast  05/14/2015  CLINICAL DATA:  2-5 day history of headache, blurry vision, slurred speech and right hand and leg pain. EXAM: CT HEAD WITHOUT CONTRAST TECHNIQUE: Contiguous axial images were obtained from the base of the skull through the vertex without intravenous contrast. COMPARISON:  05/24/2014. FINDINGS: There is an area of low attenuation in the central aspect  of the left frontal lobe in the ACA territory consistent with a acute or subacute cortical infarct. No hemorrhage. No hemispheric infarction. Remote lacunar type infarct noted in the left caudate area. The brainstem and cerebellum are normal. The bony structures are intact. The paranasal sinuses and mastoid air cells are clear. IMPRESSION: 1. Acute or subacute left ACA territory infarct.  No hemorrhage. 2. Remote  lacunar type infarct in the left caudate area. These results were called by telephone at the time of interpretation on 05/14/2015 at 11:11 pm to Dr. Azalia BilisKEVIN CAMPOS , who verbally acknowledged these results. Electronically Signed   By: Rudie MeyerP.  Gallerani M.D.   On: 05/14/2015 23:11   Mr Brain Wo Contrast  05/15/2015  CLINICAL DATA:  Initial evaluation for right lower extremity weakness. EXAM: MRI HEAD WITHOUT CONTRAST MRA HEAD WITHOUT CONTRAST TECHNIQUE: Multiplanar, multiecho pulse sequences of the brain and surrounding structures were obtained without intravenous contrast. Angiographic images of the head were obtained using MRA technique without contrast. COMPARISON:  Prior CT from 05/14/2015. FINDINGS: MRI HEAD FINDINGS Patchy and confluent areas of restricted diffusion seen throughout the parasagittal left frontal lobe extending posteriorly towards the left parietal lobe, consistent with acute left ACA territory infarct. Largest confluent area of restricted diffusion measures 3.2 x 2.0 cm. There is involvement of the anterior left corpus callosum. Associated gyral swelling without significant mass effect. Faint petechial hemorrhage without hemorrhagic transformation. No right cerebral or infratentorial infarcts. Major intravascular flow voids are maintained. No acute or chronic intracranial hemorrhage. Age-related cerebral atrophy with mild chronic microvascular ischemic disease. Remote lacunar infarct within the left basal ganglia/corona radiata. Probable small additional remote lacunar infarct within the left thalamus. Remote cortical infarct within the parasagittal left occipital lobe. No mass lesion, midline shift, or mass effect. No hydrocephalus. No extra-axial fluid collection. Craniocervical junction within normal limits. Mild degenerative spondylolysis within the upper cervical spine. Pituitary gland normal.  No acute abnormality about the orbits. Paranasal sinuses are clear. No mastoid effusion. Inner ear  structures within normal limits. Bone marrow signal intensity normal. No scalp soft tissue abnormality. MRA HEAD FINDINGS ANTERIOR CIRCULATION: Visualized distal cervical segments of the internal carotid arteries are patent with antegrade flow. The petrous, cavernous, and supraclinoid segments are patent without high-grade stenosis. Left A1 segment patent. Right A1 segment hypoplastic. Anterior communicating artery normal. Atheromatous irregularity within the right A2 segment. There is occlusion of the left A2 segment (series 6, image 97). M1 segments opacified without proximal arterial branch occlusion. There is focal moderate stenosis within the distal left M1 segment (series 802, image 14). Distal MCA branches are opacified bilaterally. POSTERIOR CIRCULATION: Left vertebral artery dominant and patent to the vertebrobasilar junction. Diminutive right vertebral artery patent as well. Left posterior inferior cerebral artery patent. Right PICA not visualized. Basilar artery widely patent. Left SCA opacified. Right SCA is faintly visualized on time-of-flight. Right P1 segment widely patent. There is a focal moderate to severe stenosis within the mid right P2 segment (series 803, image 18). Left P1 segment patent. There is apparent occlusion of the proximal left P2 segment. Patent left posterior communicating artery noted. No aneurysm. IMPRESSION: MRI HEAD IMPRESSION: 1. Acute left ACA territory infarct. There is faint petechial hemorrhage without hemorrhagic transformation or significant mass effect. 2. Remote left occipital cortical infarct, with additional remote lacunar infarcts involving the left basal ganglia/corona radiata and left thalamus. 3. Mild chronic small vessel ischemic disease. MRA HEAD IMPRESSION: 1. Occlusion of the proximal left A2 segment. 2. Occlusion of the  proximal left PCA, likely chronic given the chronic left occipital infarct. 3. Focal moderate to severe stenosis within the mid right P2  segment. 4. Focal moderate stenosis within the distal left M1 segment. 5. Multifocal atheromatous irregularity within the right ACA. Electronically Signed   By: Rise Mu M.D.   On: 05/15/2015 04:58   Mr Palma Holter  05/15/2015  CLINICAL DATA:  Initial evaluation for right lower extremity weakness. EXAM: MRI HEAD WITHOUT CONTRAST MRA HEAD WITHOUT CONTRAST TECHNIQUE: Multiplanar, multiecho pulse sequences of the brain and surrounding structures were obtained without intravenous contrast. Angiographic images of the head were obtained using MRA technique without contrast. COMPARISON:  Prior CT from 05/14/2015. FINDINGS: MRI HEAD FINDINGS Patchy and confluent areas of restricted diffusion seen throughout the parasagittal left frontal lobe extending posteriorly towards the left parietal lobe, consistent with acute left ACA territory infarct. Largest confluent area of restricted diffusion measures 3.2 x 2.0 cm. There is involvement of the anterior left corpus callosum. Associated gyral swelling without significant mass effect. Faint petechial hemorrhage without hemorrhagic transformation. No right cerebral or infratentorial infarcts. Major intravascular flow voids are maintained. No acute or chronic intracranial hemorrhage. Age-related cerebral atrophy with mild chronic microvascular ischemic disease. Remote lacunar infarct within the left basal ganglia/corona radiata. Probable small additional remote lacunar infarct within the left thalamus. Remote cortical infarct within the parasagittal left occipital lobe. No mass lesion, midline shift, or mass effect. No hydrocephalus. No extra-axial fluid collection. Craniocervical junction within normal limits. Mild degenerative spondylolysis within the upper cervical spine. Pituitary gland normal.  No acute abnormality about the orbits. Paranasal sinuses are clear. No mastoid effusion. Inner ear structures within normal limits. Bone marrow signal intensity normal. No  scalp soft tissue abnormality. MRA HEAD FINDINGS ANTERIOR CIRCULATION: Visualized distal cervical segments of the internal carotid arteries are patent with antegrade flow. The petrous, cavernous, and supraclinoid segments are patent without high-grade stenosis. Left A1 segment patent. Right A1 segment hypoplastic. Anterior communicating artery normal. Atheromatous irregularity within the right A2 segment. There is occlusion of the left A2 segment (series 6, image 97). M1 segments opacified without proximal arterial branch occlusion. There is focal moderate stenosis within the distal left M1 segment (series 802, image 14). Distal MCA branches are opacified bilaterally. POSTERIOR CIRCULATION: Left vertebral artery dominant and patent to the vertebrobasilar junction. Diminutive right vertebral artery patent as well. Left posterior inferior cerebral artery patent. Right PICA not visualized. Basilar artery widely patent. Left SCA opacified. Right SCA is faintly visualized on time-of-flight. Right P1 segment widely patent. There is a focal moderate to severe stenosis within the mid right P2 segment (series 803, image 18). Left P1 segment patent. There is apparent occlusion of the proximal left P2 segment. Patent left posterior communicating artery noted. No aneurysm. IMPRESSION: MRI HEAD IMPRESSION: 1. Acute left ACA territory infarct. There is faint petechial hemorrhage without hemorrhagic transformation or significant mass effect. 2. Remote left occipital cortical infarct, with additional remote lacunar infarcts involving the left basal ganglia/corona radiata and left thalamus. 3. Mild chronic small vessel ischemic disease. MRA HEAD IMPRESSION: 1. Occlusion of the proximal left A2 segment. 2. Occlusion of the proximal left PCA, likely chronic given the chronic left occipital infarct. 3. Focal moderate to severe stenosis within the mid right P2 segment. 4. Focal moderate stenosis within the distal left M1 segment. 5.  Multifocal atheromatous irregularity within the right ACA. Electronically Signed   By: Rise Mu M.D.   On: 05/15/2015 04:58    PHYSICAL  EXAM  General: Alert, cooperative, NAD. Eating lunch.  HEENT: PERRL, EOMI. Moist mucus membranes Neck: Full range of motion without pain, supple, no lymphadenopathy or carotid bruits Lungs: Clear to ascultation bilaterally, normal work of respiration, no wheezes, rales, rhonchi Heart: RRR, no murmurs, gallops, or rubs Abdomen: Soft, non-tender, non-distended, BS + Extremities: No cyanosis, clubbing, or edema  Mental Status -  Level of arousal and orientation to time, place, and person were intact. Language including expression, naming, repetition, comprehension was assessed and found intact. Attention span and concentration were normal. Recent and remote memory were intact. Fund of Knowledge was assessed and was intact.  Cranial Nerves II - XII - II - Visual field intact OU. III, IV, VI - Extraocular movements intact. V - Facial sensation intact bilaterally. VII - Facial movement intact bilaterally. VIII - Hearing & vestibular intact bilaterally. X - Palate elevates symmetrically. XI - Chin turning & shoulder shrug intact bilaterally. XII - Tongue protrusion intact.  Motor Strength - The patient's strength was 4/5 in RU and RLE's. 5/5 on left side.  Bulk was normal and fasciculations were absent.   Motor Tone - Muscle tone was assessed at the neck and appendages and was normal.  Reflexes - The patient's reflexes were 1+ in all extremities and she had no pathological reflexes.  Sensory - Light touch, temperature/pinprick, vibration and proprioception, and Romberg testing were assessed and were asymmetrical, mildly decreased sensation on the right leg.    Coordination - The patient had normal movements in the hands and feet with no ataxia or dysmetria.  Tremor was absent.  Gait and Station - Deferred   ASSESSMENT/PLAN [name] is a 59 y.o. female w/ PMHx of CAD, GERD, HTN, polysubstance abuse and ?PAF, admitted for left ACA territory infarct.   Stroke:  Dominant Left ACA territory infarct, etiology unclear, possibly cardioembolic vs 2/2 cocaine abuse  Resultant right sided weakness.   MRI: Acute left ACA territory infarct. There is faint petechial hemorrhage without hemorrhagic transformation or significant mass effect.  MRA: Occlusion of the proximal left A2 segment, occlusion of the proximal left PCA, likely chronic given the chronic left occipital infarct. Focal moderate to severe stenosis within the mid right P2 segment, focal moderate stenosis within the distal left M1 segment, and multifocal atheromatous irregularity within the right ACA.  2D Echo: EF 60-65%, grade 2 diastolic dysfunction. Mild focal thickening of noncoronary cusp of aortic valve.  TEE: Pending, given possible vegetation (as above) and substance abuse  LDL 134  HgbA1c pending  SCD's for VTE prophylaxis  Diet Heart Room service appropriate?: Yes; Fluid consistency:: Thin  No antithrombotic prior to admission, now on aspirin 81 mg daily and aspirin 325 mg daily  Patient counseled to be compliant with her antithrombotic medications  Ongoing aggressive stroke risk factor management  Therapy recommendations:  Pending  Disposition:  Pending  ?PAF  Patient apparently w/ a history of paroxysmal atrial fibrillation, although no atrial fibrillation on any EKG or telemetry that can be identified. Patient also on Digoxin for unknown reason.   If true PAF, would consider anticoagulation, however, patient is a questionable candidate given extensive history of non-compliance and polysubstance abuse.   Continue telemetry  Hypertension  Unstable; SBP as high as 200 systolic. Permissive hypertension (OK if < 220/120) but gradually normalize in 5-7 days Hydralazin prn for SBP >200, DBP >120.   Hyperlipidemia  Home meds:  None    LDL 134, goal < 70  Add Lipitor  Continue statin at discharge  Other Stroke Risk Factors  Advanced age  Cigarette smoker  Cocaine abuse  ETOH use  Hospital day # 1  Patient w/ moderate-sized left ACA infarct of unknown etiology. May be related to PAF, however, the history of this is unclear. May also be 2/2 cardioembolic disease related to endocarditis given possible vegetation seen on TTE. May also be 2/2 cocaine abuse. No atrial fibrillation seen on previous EKG's or telemetry. On ASA, Lipitor. Need to discern whether patient truly has PAF, at which time it may be reasonable to start anti-coagulation, however, she has an extensive history of non-compliance. Patient to go for TEE today. PT, OT evaluation.     Lauris Chroman, MD PGY-3, Internal Medicine Pager: [phone]   To contact Stroke Continuity provider, please refer to [email or web address]. After hours, contact General Neurology

## 2015-05-15 NOTE — Care Management Note (Signed)
Case Management Note  Patient Details  Name: [name] MRN: [id number] Date of Birth: [birth year]  Subjective/Objective:                  Date-05-15-15 Initial Assessment Spoke with patient at the bedside Introduced self as case manager and explained role in discharge planning and how to be reached.  Verified patient lives [address] [place] [id number]. Patient states that will  Be where she goes after discharge. Verified patient anticipates to go home alone, at time of discharge.  Patient has DME cane walker potty chair. Expressed potential need for no other DME.  Patient denied  needing help with their medication.  Patient is driven by friend to MD appointments.  Verified patient has PCP Dr Loleta ChanceHill.  Plan: CM will continue to follow for discharge planning and [facility]H resources.   Melanie Sabalebbie Calah Gershman RN BSN CM [phone]   Action/Plan:   Expected Discharge Date:                  Expected Discharge Plan:  Home/Self Care  In-House Referral:     Discharge planning Services     Post Acute Care Choice:    Choice offered to:     DME Arranged:    DME Agency:     HH Arranged:    HH Agency:     Status of Service:  In process, will continue to follow  Medicare Important Message Given:    Date Medicare IM Given:    Medicare IM give by:    Date Additional Medicare IM Given:    Additional Medicare Important Message give by:     If discussed at Long Length of Stay Meetings, dates discussed:    Additional Comments:  Melanie SabalDebbie Sayge Salvato, RN 05/15/2015, 10:56 AM

## 2015-05-15 NOTE — Progress Notes (Signed)
    [facility] has been requested to perform a transesophageal echocardiogram on [name] for stroke.  After careful review of history and examination, the risks and benefits of transesophageal echocardiogram have been explained including risks of esophageal damage, perforation (1:10,000 risk), bleeding, pharyngeal hematoma as well as other potential complications associated with conscious sedation including aspiration, arrhythmia, respiratory failure and death. Alternatives to treatment were discussed, questions were answered. Patient is willing to proceed.   Abelino DerrickKILROY,Vanecia Limpert K, PA 05/15/2015 12:58 PM

## 2015-05-16 ENCOUNTER — Encounter (HOSPITAL_COMMUNITY)
Admission: EM | Disposition: A | Discharge status: Home Health | Payer: Self-pay | Source: Home / Self Care | Attending: Internal Medicine

## 2015-05-16 ENCOUNTER — Ambulatory Visit (HOSPITAL_COMMUNITY): Payer: Medicaid Other

## 2015-05-16 ENCOUNTER — Encounter (HOSPITAL_COMMUNITY): Payer: Self-pay

## 2015-05-16 DIAGNOSIS — I350 Nonrheumatic aortic (valve) stenosis: Secondary | ICD-10-CM

## 2015-05-16 DIAGNOSIS — I639 Cerebral infarction, unspecified: Secondary | ICD-10-CM | POA: Insufficient documentation

## 2015-05-16 DIAGNOSIS — I6319 Cerebral infarction due to embolism of other precerebral artery: Secondary | ICD-10-CM

## 2015-05-16 HISTORY — PX: TEE WITHOUT CARDIOVERSION: SHX5443

## 2015-05-16 LAB — CBC
HCT: 45.7 % (ref 36.0–46.0)
Hemoglobin: 16.3 g/dL — ABNORMAL HIGH (ref 12.0–15.0)
MCH: 31.3 pg (ref 26.0–34.0)
MCHC: 35.7 g/dL (ref 30.0–36.0)
MCV: 87.9 fL (ref 78.0–100.0)
Platelets: 290 K/uL (ref 150–400)
RBC: 5.2 MIL/uL — ABNORMAL HIGH (ref 3.87–5.11)
RDW: 13.3 % (ref 11.5–15.5)
WBC: 14.1 K/uL — ABNORMAL HIGH (ref 4.0–10.5)

## 2015-05-16 LAB — BASIC METABOLIC PANEL WITH GFR
Anion gap: 10 (ref 5–15)
BUN: 10 mg/dL (ref 6–20)
CO2: 23 mmol/L (ref 22–32)
Calcium: 9.6 mg/dL (ref 8.9–10.3)
Chloride: 105 mmol/L (ref 101–111)
Creatinine, Ser: 0.93 mg/dL (ref 0.44–1.00)
GFR calc Af Amer: >60 mL/min (ref >60)
GFR calc non Af Amer: >60 mL/min (ref >60)
Glucose, Bld: 196 mg/dL — ABNORMAL HIGH (ref 65–99)
Potassium: 3.8 mmol/L (ref 3.5–5.1)
Sodium: 138 mmol/L (ref 135–145)

## 2015-05-16 LAB — DIGOXIN LEVEL: Digoxin Level: 0.8 ng/mL (ref 0.8–2.0)

## 2015-05-16 LAB — HEPATITIS PANEL, ACUTE
HCV Ab: <0.1 {s_co_ratio} (ref 0.0–0.9)
Hep A IgM: NEGATIVE
Hep B C IgM: NEGATIVE
Hepatitis B Surface Ag: NEGATIVE

## 2015-05-16 LAB — MAGNESIUM: Magnesium: 2.2 mg/dL (ref 1.7–2.4)

## 2015-05-16 LAB — HEMOGLOBIN A1C
HEMOGLOBIN A1C: 6.6 % — AB (ref 4.8–5.6)
MEAN PLASMA GLUCOSE: 143 mg/dL

## 2015-05-16 LAB — TROPONIN I: Troponin I: 0.05 ng/mL — ABNORMAL HIGH (ref <0.031)

## 2015-05-16 SURGERY — ECHOCARDIOGRAM, TRANSESOPHAGEAL
Anesthesia: Moderate Sedation

## 2015-05-16 MED ORDER — FENTANYL CITRATE (PF) 100 MCG/2ML IJ SOLN
INTRAMUSCULAR | Status: DC | PRN
  Administered 2015-05-16: 25 ug via INTRAVENOUS

## 2015-05-16 MED ORDER — SODIUM CHLORIDE 0.9 % IV SOLN
INTRAVENOUS | Status: DC
  Administered 2015-05-16: 500 mL via INTRAVENOUS
  Administered 2015-05-17: 100 mL/h via INTRAVENOUS

## 2015-05-16 MED ORDER — BUTAMBEN-TETRACAINE-BENZOCAINE 2-2-14 % EX AERO
INHALATION_SPRAY | CUTANEOUS | Status: DC | PRN
  Administered 2015-05-16: 1 via TOPICAL

## 2015-05-16 MED ORDER — LIDOCAINE VISCOUS 2 % MT SOLN
OROMUCOSAL | Status: AC
  Filled 2015-05-16: qty 15

## 2015-05-16 MED ORDER — LIDOCAINE VISCOUS 2 % MT SOLN
OROMUCOSAL | Status: DC | PRN
  Administered 2015-05-16: 20 mL via OROMUCOSAL

## 2015-05-16 MED ORDER — MIDAZOLAM HCL 5 MG/ML IJ SOLN
INTRAMUSCULAR | Status: AC
  Filled 2015-05-16: qty 2

## 2015-05-16 MED ORDER — DIPHENHYDRAMINE HCL 50 MG/ML IJ SOLN
INTRAMUSCULAR | Status: AC
  Filled 2015-05-16: qty 1

## 2015-05-16 MED ORDER — MIDAZOLAM HCL 10 MG/2ML IJ SOLN
INTRAMUSCULAR | Status: DC | PRN
Start: 2015-05-16 — End: 2015-05-16
  Administered 2015-05-16: 2 mg via INTRAVENOUS

## 2015-05-16 MED ORDER — FENTANYL CITRATE (PF) 100 MCG/2ML IJ SOLN
INTRAMUSCULAR | Status: AC
  Filled 2015-05-16: qty 2

## 2015-05-16 NOTE — Progress Notes (Signed)
Echocardiogram 2D Echocardiogram has been performed.  Dorothey BasemanReel, Kismet Facemire M 05/16/2015, 10:00 AM

## 2015-05-16 NOTE — Clinical Social Work Note (Addendum)
Patient still does not have any bed offers, CSW continuing to follow patient's progress.  CSW contacted patient's daughter per patient request and left a message on her phone requesting a call back.  Ervin KnackEric R. Loyal Rudy, MSW, Theresia MajorsLCSWA [phone] 05/16/2015 2:51 PM

## 2015-05-16 NOTE — Interval H&P Note (Signed)
History and Physical Interval Note:  05/16/2015 8:11 AM  [name]  has presented today for surgery, with the diagnosis of stroke  The various methods of treatment have been discussed with the patient and family. After consideration of risks, benefits and other options for treatment, the patient has consented to  Procedure(s): TRANSESOPHAGEAL ECHOCARDIOGRAM (TEE) (N/A) as a surgical intervention .  The patient's history has been reviewed, patient examined, no change in status, stable for surgery.  I have reviewed the patient's chart and labs.  Questions were answered to the patient's satisfaction.     Shilee Biggs R

## 2015-05-16 NOTE — Progress Notes (Signed)
PT Cancellation Note  Patient Details Name: [name] MRN: [id number] DOB: April 03, 1956   Cancelled Treatment:    Reason Eval/Treat Not Completed: Medical issues which prohibited therapy (TEE this AM.)   Rada HayHill, Chane Magner Elizabeth 05/16/2015, 1:01 PM Blanchard KelchKaren Yoshie Kosel PT [phone]

## 2015-05-16 NOTE — Progress Notes (Signed)
STROKE TEAM PROGRESS NOTE   HISTORY [name] is a 59 y.o. female w/ PMHx of CAD, GERD, HTN, polysubstance abuse and ?PAF, presents to the ED w/ complaints of weakness of the right lower extremity and recurrent falls for the past 2-3 days. She also admits to associated dizziness. While in the ED, patient was intoxicated (Et[place] 277) and UDS positive for cocaine. Initial CT head showed low density region involving left frontal ACA territory. No previous history of CVA, atrial fibrillation history unclear, has never been on anticoagulation. No antiplatelet medications previously and is not compliant w/ HTN medications.   Patient was not administered TPA secondary to being outside window.   SUBJECTIVE (INTERVAL HISTORY) No overnight events, TEE with no endocarditis.    OBJECTIVE Temp:  [97.6 F (36.4 C)-98.8 F (37.1 C)] 98.5 F (36.9 C) (11/11 0511) Pulse Rate:  [76-97] 81 (11/11 0511) Cardiac Rhythm:  [-] Normal sinus rhythm (11/10 2001) Resp:  [17-20] 17 (11/11 0511) BP: (154-208)/(66-86) 154/74 mmHg (11/11 0511) SpO2:  [96 %-98 %] 98 % (11/11 0511)  CBC:   Recent Labs Lab 05/14/15 2200 05/14/15 2219 05/16/15 0415  WBC 11.0*  --  14.1*  NEUTROABS 6.5  --   --   HGB 17.6* 17.7* 16.3*  HCT 48.3* 52.0* 45.7  MCV 87.3  --  87.9  PLT 276  --  290    Basic Metabolic Panel:   Recent Labs Lab 05/14/15 2200 05/14/15 2219 05/16/15 0415  NA 136 140 138  K 3.6 3.5 3.8  CL 101 102 105  CO2 24  --  23  GLUCOSE 115* 115* 196*  BUN CREATININE 1.05* 1.00 0.93  CALCIUM 9.6  --  9.6  MG  --   --  2.2    Lipid Panel:     Component Value Date/Time   CHOL 211* 05/15/2015 0630   TRIG 85 05/15/2015 0630   HDL 60 05/15/2015 0630   CHOLHDL 3.5 05/15/2015 0630   VLDL 17 05/15/2015 0630   LDLCALC 134* 05/15/2015 0630    Urine Drug Screen:     Component Value Date/Time   LABOPIA NONE DETECTED 05/14/2015 2200   COCAINSCRNUR POSITIVE* 05/14/2015 2200   LABBENZ  NONE DETECTED 05/14/2015 2200   AMPHETMU NONE DETECTED 05/14/2015 2200   THCU NONE DETECTED 05/14/2015 2200   LABBARB NONE DETECTED 05/14/2015 2200     IMAGING  Dg Chest 2 View  05/15/2015  CLINICAL DATA:  59 year old female with a history of stroke, weakness EXAM: CHEST - 2 VIEW COMPARISON:  08/12/2014 FINDINGS: Cardiomediastinal silhouette unchanged. Atherosclerotic calcifications of the aortic arch. Stigmata of emphysema, with increased retrosternal airspace, flattened hemidiaphragms, increased AP diameter, and hyperinflation on the AP view. Similar appearance of interstitial opacities with no confluent airspace disease. No pleural effusion or pneumothorax. No interlobular septal thickening. Degenerative changes of the spine. Unremarkable appearance of the upper abdomen. IMPRESSION: Emphysema and chronic lung scarring with no definite evidence of superimposed acute cardiopulmonary disease. Signed, Yvone Neu. Loreta Ave, DO [facility] Radiology Electronically Signed   By: Gilmer Mor D.O.   On: 05/15/2015 06:59   Ct Head Wo Contrast  05/14/2015  CLINICAL DATA:  2-5 day history of headache, blurry vision, slurred speech and right hand and leg pain. EXAM: CT HEAD WITHOUT CONTRAST TECHNIQUE: Contiguous axial images were obtained from the base of the skull through the vertex without intravenous contrast. COMPARISON:  05/24/2014. FINDINGS: There is an area of low  attenuation in the central aspect of the left frontal lobe in the ACA territory consistent with a acute or subacute cortical infarct. No hemorrhage. No hemispheric infarction. Remote lacunar type infarct noted in the left caudate area. The brainstem and cerebellum are normal. The bony structures are intact. The paranasal sinuses and mastoid air cells are clear. IMPRESSION: 1. Acute or subacute left ACA territory infarct.  No hemorrhage. 2. Remote lacunar type infarct in the left caudate area. These  results were called by telephone at the time of interpretation on 05/14/2015 at 11:11 pm to Dr. Azalia Bilis , who verbally acknowledged these results. Electronically Signed   By: Rudie Meyer M.D.   On: 05/14/2015 23:11   Mr Brain Wo Contrast  05/15/2015  CLINICAL DATA:  Initial evaluation for right lower extremity weakness. EXAM: MRI HEAD WITHOUT CONTRAST MRA HEAD WITHOUT CONTRAST TECHNIQUE: Multiplanar, multiecho pulse sequences of the brain and surrounding structures were obtained without intravenous contrast. Angiographic images of the head were obtained using MRA technique without contrast. COMPARISON:  Prior CT from 05/14/2015. FINDINGS: MRI HEAD FINDINGS Patchy and confluent areas of restricted diffusion seen throughout the parasagittal left frontal lobe extending posteriorly towards the left parietal lobe, consistent with acute left ACA territory infarct. Largest confluent area of restricted diffusion measures 3.2 x 2.0 cm. There is involvement of the anterior left corpus callosum. Associated gyral swelling without significant mass effect. Faint petechial hemorrhage without hemorrhagic transformation. No right cerebral or infratentorial infarcts. Major intravascular flow voids are maintained. No acute or chronic intracranial hemorrhage. Age-related cerebral atrophy with mild chronic microvascular ischemic disease. Remote lacunar infarct within the left basal ganglia/corona radiata. Probable small additional remote lacunar infarct within the left thalamus. Remote cortical infarct within the parasagittal left occipital lobe. No mass lesion, midline shift, or mass effect. No hydrocephalus. No extra-axial fluid collection. Craniocervical junction within normal limits. Mild degenerative spondylolysis within the upper cervical spine. Pituitary gland normal.  No acute abnormality about the orbits. Paranasal sinuses are clear. No mastoid effusion. Inner ear structures within normal limits. Bone marrow signal  intensity normal. No scalp soft tissue abnormality. MRA HEAD FINDINGS ANTERIOR CIRCULATION: Visualized distal cervical segments of the internal carotid arteries are patent with antegrade flow. The petrous, cavernous, and supraclinoid segments are patent without high-grade stenosis. Left A1 segment patent. Right A1 segment hypoplastic. Anterior communicating artery normal. Atheromatous irregularity within the right A2 segment. There is occlusion of the left A2 segment (series 6, image 97). M1 segments opacified without proximal arterial branch occlusion. There is focal moderate stenosis within the distal left M1 segment (series 802, image 14). Distal MCA branches are opacified bilaterally. POSTERIOR CIRCULATION: Left vertebral artery dominant and patent to the vertebrobasilar junction. Diminutive right vertebral artery patent as well. Left posterior inferior cerebral artery patent. Right PICA not visualized. Basilar artery widely patent. Left SCA opacified. Right SCA is faintly visualized on time-of-flight. Right P1 segment widely patent. There is a focal moderate to severe stenosis within the mid right P2 segment (series 803, image 18). Left P1 segment patent. There is apparent occlusion of the proximal left P2 segment. Patent left posterior communicating artery noted. No aneurysm. IMPRESSION: MRI HEAD IMPRESSION: 1. Acute left ACA territory infarct. There is faint petechial hemorrhage without hemorrhagic transformation or significant mass effect. 2. Remote left occipital cortical infarct, with additional remote lacunar infarcts involving the left basal ganglia/corona radiata and left thalamus. 3. Mild chronic small vessel ischemic disease. MRA HEAD IMPRESSION: 1. Occlusion of the proximal left A2  segment. 2. Occlusion of the proximal left PCA, likely chronic given the chronic left occipital infarct. 3. Focal moderate to severe stenosis within the mid right P2 segment. 4. Focal moderate stenosis within the distal  left M1 segment. 5. Multifocal atheromatous irregularity within the right ACA. Electronically Signed   By: Rise Mu M.D.   On: 05/15/2015 04:58   Mr Palma Holter  05/15/2015  CLINICAL DATA:  Initial evaluation for right lower extremity weakness. EXAM: MRI HEAD WITHOUT CONTRAST MRA HEAD WITHOUT CONTRAST TECHNIQUE: Multiplanar, multiecho pulse sequences of the brain and surrounding structures were obtained without intravenous contrast. Angiographic images of the head were obtained using MRA technique without contrast. COMPARISON:  Prior CT from 05/14/2015. FINDINGS: MRI HEAD FINDINGS Patchy and confluent areas of restricted diffusion seen throughout the parasagittal left frontal lobe extending posteriorly towards the left parietal lobe, consistent with acute left ACA territory infarct. Largest confluent area of restricted diffusion measures 3.2 x 2.0 cm. There is involvement of the anterior left corpus callosum. Associated gyral swelling without significant mass effect. Faint petechial hemorrhage without hemorrhagic transformation. No right cerebral or infratentorial infarcts. Major intravascular flow voids are maintained. No acute or chronic intracranial hemorrhage. Age-related cerebral atrophy with mild chronic microvascular ischemic disease. Remote lacunar infarct within the left basal ganglia/corona radiata. Probable small additional remote lacunar infarct within the left thalamus. Remote cortical infarct within the parasagittal left occipital lobe. No mass lesion, midline shift, or mass effect. No hydrocephalus. No extra-axial fluid collection. Craniocervical junction within normal limits. Mild degenerative spondylolysis within the upper cervical spine. Pituitary gland normal.  No acute abnormality about the orbits. Paranasal sinuses are clear. No mastoid effusion. Inner ear structures within normal limits. Bone marrow signal intensity normal. No scalp soft tissue abnormality. MRA HEAD FINDINGS  ANTERIOR CIRCULATION: Visualized distal cervical segments of the internal carotid arteries are patent with antegrade flow. The petrous, cavernous, and supraclinoid segments are patent without high-grade stenosis. Left A1 segment patent. Right A1 segment hypoplastic. Anterior communicating artery normal. Atheromatous irregularity within the right A2 segment. There is occlusion of the left A2 segment (series 6, image 97). M1 segments opacified without proximal arterial branch occlusion. There is focal moderate stenosis within the distal left M1 segment (series 802, image 14). Distal MCA branches are opacified bilaterally. POSTERIOR CIRCULATION: Left vertebral artery dominant and patent to the vertebrobasilar junction. Diminutive right vertebral artery patent as well. Left posterior inferior cerebral artery patent. Right PICA not visualized. Basilar artery widely patent. Left SCA opacified. Right SCA is faintly visualized on time-of-flight. Right P1 segment widely patent. There is a focal moderate to severe stenosis within the mid right P2 segment (series 803, image 18). Left P1 segment patent. There is apparent occlusion of the proximal left P2 segment. Patent left posterior communicating artery noted. No aneurysm. IMPRESSION: MRI HEAD IMPRESSION: 1. Acute left ACA territory infarct. There is faint petechial hemorrhage without hemorrhagic transformation or significant mass effect. 2. Remote left occipital cortical infarct, with additional remote lacunar infarcts involving the left basal ganglia/corona radiata and left thalamus. 3. Mild chronic small vessel ischemic disease. MRA HEAD IMPRESSION: 1. Occlusion of the proximal left A2 segment. 2. Occlusion of the proximal left PCA, likely chronic given the chronic left occipital infarct. 3. Focal moderate to severe stenosis within the mid right P2 segment. 4. Focal moderate stenosis within the distal left M1 segment. 5. Multifocal atheromatous irregularity within the  right ACA. Electronically Signed   By: Rise Mu M.D.   On: 05/15/2015  04:58    PHYSICAL EXAM  General: Alert, cooperative, NAD.   HEENT: PERRL, EOMI. Moist mucus membranes Neck: Full range of motion without pain, supple, no lymphadenopathy or carotid bruits Lungs: Clear to ascultation bilaterally, normal work of respiration, no wheezes, rales, rhonchi Heart: RRR, no murmurs, gallops, or rubs Abdomen: Soft, non-tender, non-distended, BS + Extremities: No cyanosis, clubbing, or edema  Mental Status -  Level of arousal and orientation to time, place, and person were intact. Language including expression, naming, repetition, comprehension was assessed and found intact. Attention span and concentration were normal. Recent and remote memory were intact. Fund of Knowledge was assessed and was intact.  Cranial Nerves II - XII - II - Visual field intact OU. III, IV, VI - Extraocular movements intact. V - Facial sensation intact bilaterally. VII - Facial movement intact bilaterally. VIII - Hearing & vestibular intact bilaterally. X - Palate elevates symmetrically. XI - Chin turning & shoulder shrug intact bilaterally. XII - Tongue protrusion intact.  Motor Strength - The patient's strength was 4/5 in RU and RLE. 5/5 on left side.  Bulk was normal and fasciculations were absent.   Motor Tone - Muscle tone was assessed at the neck and appendages and was normal.  Reflexes - The patient's reflexes were 1+ in all extremities and she had no pathological reflexes.  Sensory - Light touch, temperature/pinprick, vibration and proprioception, and Romberg testing were assessed and were asymmetrical, mildly decreased sensation on the right leg.    Coordination - The patient had normal movements in the hands and feet with no ataxia or dysmetria.  Tremor was absent.  Gait and Station - Deferred   ASSESSMENT/PLAN [name] is a 59 y.o. female w/ PMHx of CAD, GERD, HTN,  polysubstance abuse and ?PAF, admitted for left ACA territory infarct.   Stroke:  Dominant Left ACA territory infarct, etiology unclear, possibly cardioembolic vs 2/2 cocaine abuse  Resultant right sided weakness.   MRI: Acute left ACA territory infarct. There is faint petechial hemorrhage without hemorrhagic  transformation or significant mass effect.  MRA: Occlusion of the proximal left A2 segment, occlusion of the proximal left PCA, likely chronic given  the chronic left occipital infarct. Focal moderate to severe stenosis within the mid right P2 segment, focal  moderate stenosis within the distal left M1 segment, and multifocal atheromatous irregularity within the  right ACA.  2D Echo: EF 60-65%, grade 2 diastolic dysfunction. Mild focal thickening of noncoronary cusp of aortic  valve.  TEE: No endocarditis, no thrombus.   LDL 134  HgbA1c 6.6  SCD's for VTE prophylaxis Diet NPO time specified  No antithrombotic prior to admission, now on aspirin 325 mg daily   Patient counseled to be compliant with her antithrombotic medications  Ongoing aggressive stroke risk factor management  Therapy recommendations:  Pending  Disposition:  Pending  ?PAF  Patient apparently w/ a history of paroxysmal atrial fibrillation according to previous documentation,  although no atrial fibrillation on any EKG or telemetry that can be identified. Patient on Digoxin + Coreg.   If true PAF, would consider anticoagulation, however, patient is a questionable candidate given extensive  history of non-compliance and polysubstance abuse.   Continue telemetry  Consider loop recorder placement given unclear history and possibility of cardioembolic etiology  Hypertension  Unstable; SBP as high as 200 systolic.  Permissive hypertension (OK if < 220/120) but gradually normalize in 5-7 days  Hydralazin prn for SBP >200, DBP >120.   Hyperlipidemia  Home meds:  None   LDL 134, goal < 70  Add  Lipitor  Continue statin at discharge  Other Stroke Risk Factors  Advanced age  Cigarette smoker  Cocaine abuse  ETOH use  Hospital day # 2  Patient with no significant change in neuro exam. TEE without endocarditis or thrombus. Still unclear history regarding atrial fibrillation. Continue ASA, would consider loop recorder placement given unclear atrial fibrillation history and possible cardioembolic etiology of stroke.     Lauris Chroman, MD PGY-3, Internal Medicine Pager: [phone]   Personally examined patient and images, and have participated in and made any corrections needed to history, physical, neuro exam,assessment and plan as stated above.  I have personally obtained the history, evaluated lab date, reviewed imaging studies and agree with radiology interpretations.   59 yo F with hx of HTN, MI, ? Afib, smoker, cocaine and alcohol user admitted for right sided weakness, leg>arm. MRI showed left ACA infarct and MRA showed left A2 occlusion as well as multifocal intracranial stenosis. 2D echo concerning for endocarditis. TEE was performed. Pending CUS, A1C 6.6. LDL 134. On ASA. Consider loop recorder.   Naomie Dean, MD Stroke Neurology [phone] [facility]      To contact Stroke Continuity provider, please refer to [email or web address]. After hours, contact General Neurology

## 2015-05-16 NOTE — Progress Notes (Signed)
VASCULAR LAB PRELIMINARY  PRELIMINARY  PRELIMINARY  PRELIMINARY  Carotid duplex  completed.    Preliminary report:  Bilateral:  1-39% ICA stenosis.  Vertebral artery flow is antegrade.      Geneieve Duell, RVT 05/16/2015, 3:06 PM

## 2015-05-16 NOTE — Progress Notes (Addendum)
PATIENT DETAILS Name: Melanie ShutterRosemary Fleming Age: 59 y.o. Sex: female Date of Birth: September 02, 1955 Admit Date: 05/14/2015 Admitting Physician Clydie Braunondell A Smith, MD ZOX:WRUE,AVWUJWPCP:HILL,[name], MD  Brief narrative: 59 year old female with history of hypertension, dyslipidemia,? PAF, cocaine abuse, alcohol Abuse and tobacco abuse presented to the hospital with dysarthria, right hemiparesis-found to have acute CVA.   Subjective: Speech better-mild right-sided deficits unchanged  Assessment/Plan: Principal Problem: Acute/subacute CVA: Suspect embolic given history of ? atrial fibrillation,cocaine use-some concern for infective endocarditis-however blood cultures neg and TEE negative for vegetation or embolic source. Speech improved, now only slightly dysarthric, mild right-sided hemiparesis 4/5. Await carotid Doppler, PT/OT evaluation. Await further recommendations from neurology, continue aspirin. Note A1c 6.6-Will emphasize diet/lifestyle modifications,LDL 134 (goal < 70)-started Lipitor.  Active Problems: Reported history of paroxysmal atrial fibrillation: Reviewed numerous EKGs available in Epic-no evidence of atrophic fibrillation, telemetry negative as well. Patient on digoxin and Coreg prior to this admission. Patient thinks she has had irregular heart rate in the past-we will obtain records from her PCPs office. If she indeed has documented A. fib-her CHA2DS2-VASc score is 5 and will likely need to be on anticoagulation. She was on Coreg prior to this admission-given history of cocaine use-may need to be switched to Cardizem in the next few days. Await further recommendations from neurology, and outpatient records from her PCP.  Essential hypertension: Continue to hold Coreg-although permissive hypertension in the setting of acute CVA.  Dyslipidemia: See above  Mild polycythemia: Suspect in a setting of hemoconcentration-patient has been hydrated-hemoglobin down to 16.3-we'll continue to  follow CBCs. Continue antiplatelets-since CBC decreasing-suspect can hold off on workup, if persistently elevated-will need outpatient hematology evaluation.  Tobacco abuse: Counseled-continue transdermal nicotine  Cocaine abuse: Counseled-watch for withdrawal.  EtOH abuse: Drinks 40 ounces of beer on a daily basis-no signs of withdrawal-follow-continue Ativan per protocol.  Chronic Steroid JXB:JYNWGNFse:suspect on it for arthritis-per patient on decadron for 6 months-will defer taper to outpatient setting.  Moderate AR: Outpatient follow-up with cardiology. No evidence of vegetation on TEE. Note transthoracic echo showed focal thickening involving the known aortic cusp  Disposition: Remain inpatient-suspect home once workup complete  Antimicrobial agents  See below  Anti-infectives    None      DVT Prophylaxis: Prophylactic Lovenox   Code Status: Full code   Family Communication Daughter at bedside  Procedures: None  CONSULTS:  neurology  Time spent 30 minutes-Greater than 50% of this time was spent in counseling, explanation of diagnosis, planning of further management, and coordination of care.  MEDICATIONS: Scheduled Meds: . aspirin  300 mg Rectal Daily   Or  . aspirin  325 mg Oral Daily  . atorvastatin  40 mg Oral q1800  . dexamethasone  4 mg Oral Daily  . digoxin  250 mcg Oral Daily  . enoxaparin (LOVENOX) injection  40 mg Subcutaneous Q24H  . folic acid  1 mg Oral Daily  . multivitamin with minerals  1 tablet Oral Daily  . nicotine  14 mg Transdermal Daily  . thiamine  100 mg Oral Daily   Or  . thiamine  100 mg Intravenous Daily   Continuous Infusions: . sodium chloride 500 mL (05/16/15 0823)   PRN Meds:.cyclobenzaprine, hydrALAZINE, HYDROcodone-acetaminophen, LORazepam **OR** LORazepam, morphine injection, senna-docusate    PHYSICAL EXAM: Vital signs in last 24 hours: Filed Vitals:   05/16/15 0940 05/16/15 0950 05/16/15 1000 05/16/15 1010  BP: 131/58  133/63 135/66  159/63  Pulse: 84 78 77 78  Temp:      TempSrc:      Resp: Height:      Weight:      SpO2: 98% 94% 94% 94%    Weight change:  Filed Weights   05/14/15 2109 05/15/15 0041  Weight: 58.968 kg (130 lb) 61.326 kg (135 lb 3.2 oz)   Body mass index is 22.5 kg/(m^2).   Gen Exam: Awake and alert with mild dysarthric speech.   Neck: Supple, No JVD.   Chest: B/L Clear.   CVS: S1 S2 Regular, no murmurs.  Abdomen: soft, BS +, non tender, non distended.  Extremities: no edema, lower extremities warm to touch. Neurologic: Right upper extremity/right lower extremity 4/5  Skin: No Rash.   Wounds: N/A.    Intake/Output from previous day:  Intake/Output Summary (Last 24 hours) at 05/16/15 1351 Last data filed at 05/16/15 0902  Gross per 24 hour  Intake    240 ml  Output    875 ml  Net   -635 ml     LAB RESULTS: CBC  Recent Labs Lab 05/14/15 2200 05/14/15 2219 05/16/15 0415  WBC 11.0*  --  14.1*  HGB 17.6* 17.7* 16.3*  HCT 48.3* 52.0* 45.7  PLT 276  --  290  MCV 87.3  --  87.9  MCH 31.8  --  31.3  MCHC 36.4*  --  35.7  RDW 13.2  --  13.3  LYMPHSABS 3.3  --   --   MONOABS 1.1*  --   --   EOSABS 0.1  --   --   BASOSABS 0.0  --   --     Chemistries   Recent Labs Lab 05/14/15 2200 05/14/15 2219 05/16/15 0415  NA 136 140 138  K 3.6 3.5 3.8  CL 101 102 105  CO2 24  --  23  GLUCOSE 115* 115* 196*  BUN CREATININE 1.05* 1.00 0.93  CALCIUM 9.6  --  9.6  MG  --   --  2.2    CBG: No results for input(s): GLUCAP in the last 168 hours.  GFR Estimated Creatinine Clearance: 57.2 mL/min (by C-G formula based on Cr of 0.93).  Coagulation profile  Recent Labs Lab 05/14/15 2200  INR 1.01    Cardiac Enzymes  Recent Labs Lab 05/15/15 1527 05/15/15 2221 05/16/15 0420  TROPONINI <0.03 0.04* 0.05*    Invalid input(s): POCBNP No results for input(s): DDIMER in the last 72 hours.  Recent Labs  05/15/15 0630  HGBA1C 6.6*      Recent Labs  05/15/15 0630  CHOL 211*  HDL 60  LDLCALC 134*  TRIG 85  CHOLHDL 3.5   No results for input(s): TSH, T4TOTAL, T3FREE, THYROIDAB in the last 72 hours.  Invalid input(s): FREET3 No results for input(s): VITAMINB12, FOLATE, FERRITIN, TIBC, IRON, RETICCTPCT in the last 72 hours. No results for input(s): LIPASE, AMYLASE in the last 72 hours.  Urine Studies No results for input(s): UHGB, CRYS in the last 72 hours.  Invalid input(s): UACOL, UAPR, USPG, UPH, UTP, UGL, UKET, UBIL, UNIT, UROB, ULEU, UEPI, UWBC, URBC, UBAC, CAST, UCOM, BILUA  MICROBIOLOGY: Recent Results (from the past 240 hour(s))  Culture, blood (routine x 2)     Status: None (Preliminary result)   Collection Time: 05/15/15 11:52 AM  Result Value Ref Range Status   Specimen Description BLOOD LEFT HAND  Final   Special Requests  BOTTLES DRAWN AEROBIC AND ANAEROBIC 5CC  Final   Culture NO GROWTH 1 DAY  Final   Report Status PENDING  Incomplete  Culture, blood (routine x 2)     Status: None (Preliminary result)   Collection Time: 05/15/15 12:00 PM  Result Value Ref Range Status   Specimen Description BLOOD RIGHT HAND  Final   Special Requests BOTTLES DRAWN AEROBIC AND ANAEROBIC 5CC  Final   Culture NO GROWTH 1 DAY  Final   Report Status PENDING  Incomplete    RADIOLOGY STUDIES/RESULTS: Dg Chest 2 View  05/15/2015  CLINICAL DATA:  59 year old female with a history of stroke, weakness EXAM: CHEST - 2 VIEW COMPARISON:  08/12/2014 FINDINGS: Cardiomediastinal silhouette unchanged. Atherosclerotic calcifications of the aortic arch. Stigmata of emphysema, with increased retrosternal airspace, flattened hemidiaphragms, increased AP diameter, and hyperinflation on the AP view. Similar appearance of interstitial opacities with no confluent airspace disease. No pleural effusion or pneumothorax. No interlobular septal thickening. Degenerative changes of the spine. Unremarkable appearance of the upper abdomen.  IMPRESSION: Emphysema and chronic lung scarring with no definite evidence of superimposed acute cardiopulmonary disease. Signed, Yvone Neu. Loreta Ave, DO [facility] Radiology Electronically Signed   By: Gilmer Mor D.O.   On: 05/15/2015 06:59   Ct Head Wo Contrast  05/14/2015  CLINICAL DATA:  2-5 day history of headache, blurry vision, slurred speech and right hand and leg pain. EXAM: CT HEAD WITHOUT CONTRAST TECHNIQUE: Contiguous axial images were obtained from the base of the skull through the vertex without intravenous contrast. COMPARISON:  05/24/2014. FINDINGS: There is an area of low attenuation in the central aspect of the left frontal lobe in the ACA territory consistent with a acute or subacute cortical infarct. No hemorrhage. No hemispheric infarction. Remote lacunar type infarct noted in the left caudate area. The brainstem and cerebellum are normal. The bony structures are intact. The paranasal sinuses and mastoid air cells are clear. IMPRESSION: 1. Acute or subacute left ACA territory infarct.  No hemorrhage. 2. Remote lacunar type infarct in the left caudate area. These results were called by telephone at the time of interpretation on 05/14/2015 at 11:11 pm to Dr. Azalia Bilis , who verbally acknowledged these results. Electronically Signed   By: Rudie Meyer M.D.   On: 05/14/2015 23:11   Mr Brain Wo Contrast  05/15/2015  CLINICAL DATA:  Initial evaluation for right lower extremity weakness. EXAM: MRI HEAD WITHOUT CONTRAST MRA HEAD WITHOUT CONTRAST TECHNIQUE: Multiplanar, multiecho pulse sequences of the brain and surrounding structures were obtained without intravenous contrast. Angiographic images of the head were obtained using MRA technique without contrast. COMPARISON:  Prior CT from 05/14/2015. FINDINGS: MRI HEAD FINDINGS Patchy and confluent areas of restricted diffusion seen throughout the parasagittal left frontal lobe extending posteriorly  towards the left parietal lobe, consistent with acute left ACA territory infarct. Largest confluent area of restricted diffusion measures 3.2 x 2.0 cm. There is involvement of the anterior left corpus callosum. Associated gyral swelling without significant mass effect. Faint petechial hemorrhage without hemorrhagic transformation. No right cerebral or infratentorial infarcts. Major intravascular flow voids are maintained. No acute or chronic intracranial hemorrhage. Age-related cerebral atrophy with mild chronic microvascular ischemic disease. Remote lacunar infarct within the left basal ganglia/corona radiata. Probable small additional remote lacunar infarct within the left thalamus. Remote cortical infarct within the parasagittal left occipital lobe. No mass lesion, midline shift, or mass effect. No hydrocephalus. No extra-axial fluid collection. Craniocervical junction within normal limits.  Mild degenerative spondylolysis within the upper cervical spine. Pituitary gland normal.  No acute abnormality about the orbits. Paranasal sinuses are clear. No mastoid effusion. Inner ear structures within normal limits. Bone marrow signal intensity normal. No scalp soft tissue abnormality. MRA HEAD FINDINGS ANTERIOR CIRCULATION: Visualized distal cervical segments of the internal carotid arteries are patent with antegrade flow. The petrous, cavernous, and supraclinoid segments are patent without high-grade stenosis. Left A1 segment patent. Right A1 segment hypoplastic. Anterior communicating artery normal. Atheromatous irregularity within the right A2 segment. There is occlusion of the left A2 segment (series 6, image 97). M1 segments opacified without proximal arterial branch occlusion. There is focal moderate stenosis within the distal left M1 segment (series 802, image 14). Distal MCA branches are opacified bilaterally. POSTERIOR CIRCULATION: Left vertebral artery dominant and patent to the vertebrobasilar junction.  Diminutive right vertebral artery patent as well. Left posterior inferior cerebral artery patent. Right PICA not visualized. Basilar artery widely patent. Left SCA opacified. Right SCA is faintly visualized on time-of-flight. Right P1 segment widely patent. There is a focal moderate to severe stenosis within the mid right P2 segment (series 803, image 18). Left P1 segment patent. There is apparent occlusion of the proximal left P2 segment. Patent left posterior communicating artery noted. No aneurysm. IMPRESSION: MRI HEAD IMPRESSION: 1. Acute left ACA territory infarct. There is faint petechial hemorrhage without hemorrhagic transformation or significant mass effect. 2. Remote left occipital cortical infarct, with additional remote lacunar infarcts involving the left basal ganglia/corona radiata and left thalamus. 3. Mild chronic small vessel ischemic disease. MRA HEAD IMPRESSION: 1. Occlusion of the proximal left A2 segment. 2. Occlusion of the proximal left PCA, likely chronic given the chronic left occipital infarct. 3. Focal moderate to severe stenosis within the mid right P2 segment. 4. Focal moderate stenosis within the distal left M1 segment. 5. Multifocal atheromatous irregularity within the right ACA. Electronically Signed   By: Rise Mu M.D.   On: 05/15/2015 04:58   Mr Palma Holter  05/15/2015  CLINICAL DATA:  Initial evaluation for right lower extremity weakness. EXAM: MRI HEAD WITHOUT CONTRAST MRA HEAD WITHOUT CONTRAST TECHNIQUE: Multiplanar, multiecho pulse sequences of the brain and surrounding structures were obtained without intravenous contrast. Angiographic images of the head were obtained using MRA technique without contrast. COMPARISON:  Prior CT from 05/14/2015. FINDINGS: MRI HEAD FINDINGS Patchy and confluent areas of restricted diffusion seen throughout the parasagittal left frontal lobe extending posteriorly towards the left parietal lobe, consistent with acute left ACA territory  infarct. Largest confluent area of restricted diffusion measures 3.2 x 2.0 cm. There is involvement of the anterior left corpus callosum. Associated gyral swelling without significant mass effect. Faint petechial hemorrhage without hemorrhagic transformation. No right cerebral or infratentorial infarcts. Major intravascular flow voids are maintained. No acute or chronic intracranial hemorrhage. Age-related cerebral atrophy with mild chronic microvascular ischemic disease. Remote lacunar infarct within the left basal ganglia/corona radiata. Probable small additional remote lacunar infarct within the left thalamus. Remote cortical infarct within the parasagittal left occipital lobe. No mass lesion, midline shift, or mass effect. No hydrocephalus. No extra-axial fluid collection. Craniocervical junction within normal limits. Mild degenerative spondylolysis within the upper cervical spine. Pituitary gland normal.  No acute abnormality about the orbits. Paranasal sinuses are clear. No mastoid effusion. Inner ear structures within normal limits. Bone marrow signal intensity normal. No scalp soft tissue abnormality. MRA HEAD FINDINGS ANTERIOR CIRCULATION: Visualized distal cervical segments of the internal carotid arteries are patent with antegrade flow.  The petrous, cavernous, and supraclinoid segments are patent without high-grade stenosis. Left A1 segment patent. Right A1 segment hypoplastic. Anterior communicating artery normal. Atheromatous irregularity within the right A2 segment. There is occlusion of the left A2 segment (series 6, image 97). M1 segments opacified without proximal arterial branch occlusion. There is focal moderate stenosis within the distal left M1 segment (series 802, image 14). Distal MCA branches are opacified bilaterally. POSTERIOR CIRCULATION: Left vertebral artery dominant and patent to the vertebrobasilar junction. Diminutive right vertebral artery patent as well. Left posterior inferior  cerebral artery patent. Right PICA not visualized. Basilar artery widely patent. Left SCA opacified. Right SCA is faintly visualized on time-of-flight. Right P1 segment widely patent. There is a focal moderate to severe stenosis within the mid right P2 segment (series 803, image 18). Left P1 segment patent. There is apparent occlusion of the proximal left P2 segment. Patent left posterior communicating artery noted. No aneurysm. IMPRESSION: MRI HEAD IMPRESSION: 1. Acute left ACA territory infarct. There is faint petechial hemorrhage without hemorrhagic transformation or significant mass effect. 2. Remote left occipital cortical infarct, with additional remote lacunar infarcts involving the left basal ganglia/corona radiata and left thalamus. 3. Mild chronic small vessel ischemic disease. MRA HEAD IMPRESSION: 1. Occlusion of the proximal left A2 segment. 2. Occlusion of the proximal left PCA, likely chronic given the chronic left occipital infarct. 3. Focal moderate to severe stenosis within the mid right P2 segment. 4. Focal moderate stenosis within the distal left M1 segment. 5. Multifocal atheromatous irregularity within the right ACA. Electronically Signed   By: Rise Mu M.D.   On: 05/15/2015 04:58    Jeoffrey Massed, MD  [facility] Pager:[phone]  If 7PM-7AM, please contact night-coverage [email or web address] Password [email or web address] 05/16/2015, 1:51 PM   LOS: 2 days

## 2015-05-16 NOTE — H&P (View-Only) (Signed)
Admission H&P    Chief Complaint: Recurrent falls with right lower extremity weakness.  HPI: [name] is an 59 y.o. female history of hypertension, myocardial infarction, atrial fibrillation, cocaine and alcohol use, presenting with weakness involving her right lower extremity for 2 days with recurrent falls. He is also complaining of dizziness. Patient's alcohol level was 277 in the ED. Urine was positive for cocaine. CT scan of her head showed low density area involving the left frontal region, ACA territory. Patient has no previous history of stroke. She has not been on antiplatelet therapy and has not been compliant with antihypertensive medication.  LSN: 05/12/2015 tPA Given: No: Beyond time under for treatment consideration mRankin:  Past Medical History  Diagnosis Date  . Hypertension   . Complication of anesthesia     Heart attack during surgery  . Myocardial infarction ([place])   . Heart murmur   . Shortness of breath   . Pneumonia     hx of pna  . Dysrhythmia     atrial fibrilation  . GERD (gastroesophageal reflux disease)   . Arthritis     Past Surgical History  Procedure Laterality Date  . Total hip arthroplasty  2010  . Replacement total knee bilateral    . Shoulder arthroscopy w/ rotator cuff repair    . Skin graft    . Abdominal hysterectomy    . Hip surgery  12/09/2011    revision  . Total hip revision  12/09/2011    Procedure: TOTAL HIP REVISION;  Surgeon: Sharmon Revere, MD;  Location: [facility];  Service: Orthopedics;  Laterality: Left;    Family History  Problem Relation Age of Onset  . Anesthesia problems Neg Hx    Social History:  reports that she has been smoking.  She has never used smokeless tobacco. She reports that she drinks alcohol. She reports that she uses illicit drugs (Cocaine and Marijuana).  Allergies:  Allergies  Allergen Reactions  . Penicillins Itching   Medications: Patient's preadmission medications were reviewed by  me.  ROS: History obtained from patient and her daughter.  General ROS: negative for - chills, fatigue, fever, night sweats, weight gain or weight loss Psychological ROS: negative for - behavioral disorder, hallucinations, memory difficulties, mood swings or suicidal ideation Ophthalmic ROS: negative for - blurry vision, double vision, eye pain or loss of vision ENT ROS: negative for - epistaxis, nasal discharge, oral lesions, sore throat, tinnitus or vertigo Allergy and Immunology ROS: negative for - hives or itchy/watery eyes Hematological and Lymphatic ROS: negative for - bleeding problems, bruising or swollen lymph nodes Endocrine ROS: negative for - galactorrhea, hair pattern changes, polydipsia/polyuria or temperature intolerance Respiratory ROS: negative for - cough, hemoptysis, shortness of breath or wheezing Cardiovascular ROS: negative for - chest pain, dyspnea on exertion, edema or irregular heartbeat Gastrointestinal ROS: negative for - abdominal pain, diarrhea, hematemesis, nausea/vomiting or stool incontinence Genito-Urinary ROS: negative for - dysuria, hematuria, incontinence or urinary frequency/urgency Musculoskeletal ROS: negative for - joint swelling or muscular weakness Neurological ROS: as noted in HPI Dermatological ROS: negative for rash and skin lesion changes  Physical Examination: Blood pressure 205/84, pulse 74, temperature 99.1 F (37.3 C), temperature source Oral, resp. rate 17, height 5' 5"  (1.651 m), weight 58.968 kg (130 lb), SpO2 95 %.  HEENT-  Normocephalic, no lesions, without obvious abnormality.  Normal external eye and conjunctiva.  Normal TM's bilaterally.  Normal auditory canals and external ears. Normal external nose, mucus membranes and septum.  Normal  pharynx. Neck supple with no masses, nodes, nodules or enlargement. Cardiovascular - regular rate and rhythm and systolic murmur: early systolic 3/6, crescendo at 2nd right intercostal space Lungs -  chest clear, no wheezing, rales, normal symmetric air entry Abdomen - soft, non-tender; bowel sounds normal; no masses,  no organomegaly Extremities - no joint deformities, effusion, or inflammation and no edema  Neurologic Examination: Mental Status: Moderately intoxicated, oriented, thought content appropriate.  Speech slightly slurred without evidence of aphasia. Able to follow commands without difficulty. Cranial Nerves: II-Visual fields were normal. III/IV/VI-Pupils were equal and reacted normally to light. Extraocular movements were full and conjugate.    V/VII-no facial numbness and no facial weakness. VIII-normal. X-mild dysarthria. XI: trapezius strength/neck flexion strength normal bilaterally XII-midline tongue extension with normal strength. Motor: Moderate drift of right lower extremity occipital weakness; cortical fisting of right hand; motor exam otherwise unremarkable Sensory: Reduced perception of tactile sensation over right arm and leg compared to left extremities. Deep Tendon Reflexes: 1+ and symmetric. Plantars: Mute bilaterally Cerebellar: Normal finger-to-nose testing. Carotid auscultation: Normal  Results for orders placed or performed during the hospital encounter of 05/14/15 (from the past 48 hour(s))  Ethanol     Status: None   Collection Time: 05/14/15 10:00 PM  Result Value Ref Range   Alcohol, Ethyl (B) <5 <5 mg/dL    Comment:        LOWEST DETECTABLE LIMIT FOR SERUM ALCOHOL IS 5 mg/dL FOR MEDICAL PURPOSES ONLY   Protime-INR     Status: None   Collection Time: 05/14/15 10:00 PM  Result Value Ref Range   Prothrombin Time 13.5 11.6 - 15.2 seconds   INR 1.01 0.00 - 1.49  APTT     Status: None   Collection Time: 05/14/15 10:00 PM  Result Value Ref Range   aPTT 27 24 - 37 seconds  CBC     Status: Abnormal   Collection Time: 05/14/15 10:00 PM  Result Value Ref Range   WBC 11.0 (H) 4.0 - 10.5 K/uL   RBC 5.53 (H) 3.87 - 5.11 MIL/uL   Hemoglobin 17.6  (H) 12.0 - 15.0 g/dL   HCT 48.3 (H) 36.0 - 46.0 %   MCV 87.3 78.0 - 100.0 fL   MCH 31.8 26.0 - 34.0 pg   MCHC 36.4 (H) 30.0 - 36.0 g/dL   RDW 13.2 11.5 - 15.5 %   Platelets 276 150 - 400 K/uL  Differential     Status: Abnormal   Collection Time: 05/14/15 10:00 PM  Result Value Ref Range   Neutrophils Relative % 59 %   Neutro Abs 6.5 1.7 - 7.7 K/uL   Lymphocytes Relative 30 %   Lymphs Abs 3.3 0.7 - 4.0 K/uL   Monocytes Relative 10 %   Monocytes Absolute 1.1 (H) 0.1 - 1.0 K/uL   Eosinophils Relative 1 %   Eosinophils Absolute 0.1 0.0 - 0.7 K/uL   Basophils Relative 0 %   Basophils Absolute 0.0 0.0 - 0.1 K/uL  Comprehensive metabolic panel     Status: Abnormal   Collection Time: 05/14/15 10:00 PM  Result Value Ref Range   Sodium 136 135 - 145 mmol/L   Potassium 3.6 3.5 - 5.1 mmol/L   Chloride 101 101 - 111 mmol/L   CO2 24 22 - 32 mmol/L   Glucose, Bld 115 (H) 65 - 99 mg/dL   BUN 9 6 - 20 mg/dL   Creatinine, Ser 1.05 (H) 0.44 - 1.00 mg/dL   Calcium 9.6 8.9 -  10.3 mg/dL   Total Protein 7.6 6.5 - 8.1 g/dL   Albumin 3.9 3.5 - 5.0 g/dL   AST 33 15 - 41 U/L   ALT 19 14 - 54 U/L   Alkaline Phosphatase 90 38 - 126 U/L   Total Bilirubin 1.0 0.3 - 1.2 mg/dL   GFR calc non Af Amer 56 (L) >60 mL/min   GFR calc Af Amer >60 >60 mL/min    Comment: (NOTE) The eGFR has been calculated using the CKD EPI equation. This calculation has not been validated in all clinical situations. eGFR's persistently <60 mL/min signify possible Chronic Kidney Disease.    Anion gap 11 5 - 15  Urine rapid drug screen (hosp performed)not at [facility]     Status: Abnormal   Collection Time: 05/14/15 10:00 PM  Result Value Ref Range   Opiates NONE DETECTED NONE DETECTED   Cocaine POSITIVE (A) NONE DETECTED   Benzodiazepines NONE DETECTED NONE DETECTED   Amphetamines NONE DETECTED NONE DETECTED   Tetrahydrocannabinol NONE DETECTED NONE DETECTED   Barbiturates NONE DETECTED NONE DETECTED    Comment:        DRUG  SCREEN FOR MEDICAL PURPOSES ONLY.  IF CONFIRMATION IS NEEDED FOR ANY PURPOSE, NOTIFY LAB WITHIN 5 DAYS.        LOWEST DETECTABLE LIMITS FOR URINE DRUG SCREEN Drug Class       Cutoff (ng/mL) Amphetamine      1000 Barbiturate      200 Benzodiazepine   161 Tricyclics       096 Opiates          300 Cocaine          300 THC              50   Urinalysis, Routine w reflex microscopic (not at [facility])     Status: Abnormal   Collection Time: 05/14/15 10:00 PM  Result Value Ref Range   Color, Urine YELLOW YELLOW   APPearance CLOUDY (A) CLEAR   Specific Gravity, Urine 1.006 1.005 - 1.030   pH 6.5 5.0 - 8.0   Glucose, UA NEGATIVE NEGATIVE mg/dL   Hgb urine dipstick NEGATIVE NEGATIVE   Bilirubin Urine NEGATIVE NEGATIVE   Ketones, ur NEGATIVE NEGATIVE mg/dL   Protein, ur NEGATIVE NEGATIVE mg/dL   Urobilinogen, UA 0.2 0.0 - 1.0 mg/dL   Nitrite NEGATIVE NEGATIVE   Leukocytes, UA NEGATIVE NEGATIVE    Comment: MICROSCOPIC NOT DONE ON URINES WITH NEGATIVE PROTEIN, BLOOD, LEUKOCYTES, NITRITE, OR GLUCOSE <1000 mg/dL.  I-stat troponin, ED (not at [facility], [facility])     Status: None   Collection Time: 05/14/15 10:17 PM  Result Value Ref Range   Troponin i, poc 0.02 0.00 - 0.08 ng/mL   Comment 3            Comment: Due to the release kinetics of cTnI, a negative result within the first hours of the onset of symptoms does not rule out myocardial infarction with certainty. If myocardial infarction is still suspected, repeat the test at appropriate intervals.   I-Stat Chem 8, ED  (not at [facility], [facility])     Status: Abnormal   Collection Time: 05/14/15 10:19 PM  Result Value Ref Range   Sodium 140 135 - 145 mmol/L   Potassium 3.5 3.5 - 5.1 mmol/L   Chloride 102 101 - 111 mmol/L   BUN 11 6 - 20 mg/dL   Creatinine, Ser 1.00 0.44 - 1.00 mg/dL   Glucose, Bld 115 (H) 65 -  99 mg/dL   Calcium, Ion 1.05 (L) 1.13 - 1.30 mmol/L   TCO2 24 0 - 100 mmol/L   Hemoglobin 17.7 (H) 12.0 - 15.0 g/dL   HCT 52.0 (H) 36.0 -  46.0 %   Ct Head Wo Contrast  05/14/2015  CLINICAL DATA:  2-5 day history of headache, blurry vision, slurred speech and right hand and leg pain. EXAM: CT HEAD WITHOUT CONTRAST TECHNIQUE: Contiguous axial images were obtained from the base of the skull through the vertex without intravenous contrast. COMPARISON:  05/24/2014. FINDINGS: There is an area of low attenuation in the central aspect of the left frontal lobe in the ACA territory consistent with a acute or subacute cortical infarct. No hemorrhage. No hemispheric infarction. Remote lacunar type infarct noted in the left caudate area. The brainstem and cerebellum are normal. The bony structures are intact. The paranasal sinuses and mastoid air cells are clear. IMPRESSION: 1. Acute or subacute left ACA territory infarct.  No hemorrhage. 2. Remote lacunar type infarct in the left caudate area. These results were called by telephone at the time of interpretation on 05/14/2015 at 11:11 pm to Dr. Jola Schmidt , who verbally acknowledged these results. Electronically Signed   By: Marijo Sanes M.D.   On: 05/14/2015 23:11    Assessment: 59 y.o. female with multiple risk factors for stroke presenting with acute left ACA territory frontal ischemic infarction.  Stroke Risk Factors - atrial fibrillation, family history and hypertension  Plan: 1. HgbA1c, fasting lipid panel 2. MRI, MRA  of the brain without contrast 3. PT consult, OT consult, Speech consult 4. Echocardiogram 5. Carotid dopplers 6. Prophylactic therapy-Antiplatelet med: Aspirin  7. Risk factor modification 8. Telemetry monitoring  C.R. Nicole Kindred, MD [facility] [phone]  05/14/2015, 11:48 PM

## 2015-05-16 NOTE — CV Procedure (Signed)
    PROCEDURE NOTE:  Procedure:  Transesophageal echocardiogram Operator:  Armanda Magicraci Shanetha Bradham, MD Indications:  CVA Complications: None IV Meds:Versed 2mg , Fentanyl 50mcg IV  Results: Normal LV size and function Normal RV size and function Normal RA Normal LA and LA appendage Normal TV Normal PV Normal MV with trivial MR Normal trileaflet AV with possible mild prolapse of the right coronary cups with moderate eccentric aortic insufficiency Normal interatrial septum with no evidence of shunt by colorflow dopper and agitated saline contrast study. Normal thoracic and ascending aorta.  No obvious source of emboli.    The patient tolerated the procedure well and was transferred back to their room in stable condition.  Signed: Armanda Magicraci Rosely Fernandez, MD [facility]

## 2015-05-16 NOTE — Progress Notes (Signed)
SLP Cancellation Note  Patient Details Name: Zoila ShutterRosemary Howatt MRN: [id number] DOB: March 01, 1956   Cancelled treatment:       Reason Eval/Treat Not Completed: Patient at procedure or test/unavailable   DeBlois, Riley NearingBonnie Caroline 05/16/2015, 11:52 AM

## 2015-05-16 NOTE — Clinical Documentation Improvement (Signed)
Internal Medicine  A cause and effect relationship may not be assumed and must be documented by a provider.  Please clarify the relationship, if any, between Cocaine abuse and Cerebral infarction   Are the conditions:   Cerebral infarction Due to or associated Cocaine abuse   Cerebral infarction not due to Cocaine abuse   Other  Clinically Undetermined     Please exercise your independent, professional judgment when responding. A specific answer is not anticipated or expected.   Thank You,  Lavonda JumboLawanda J Steve Gregg [employer] [place] [phone]

## 2015-05-16 NOTE — Clinical Social Work Note (Signed)
CSW spoke to patient's daughter to discuss SNF placement options.  CSW explained to patient's daughter that PT has see her and give recommendation on either home health or SNF placement.  CSW informed patient's daughter that with only having Medicaid, there will most likely be a copay at a SNF and it would vary depending on where she goes.  Patient's daughter would like to have [facility] possible in [place] if they can take her.  CSW contacted SNF and left a message with admissions to call back unit social worker.  Ervin KnackEric R. Amy Belloso, MSW, Theresia MajorsLCSWA [phone] 05/16/2015 5:28 PM

## 2015-05-17 ENCOUNTER — Inpatient Hospital Stay (HOSPITAL_COMMUNITY)
Admission: EM | Admit: 2015-05-17 | Discharge: 2015-05-20 | DRG: 313 | Disposition: A | Discharge status: Home / Self Care | Payer: Medicaid Other | Attending: Internal Medicine | Admitting: Internal Medicine

## 2015-05-17 ENCOUNTER — Encounter (HOSPITAL_COMMUNITY): Payer: Self-pay | Admitting: Emergency Medicine

## 2015-05-17 DIAGNOSIS — I252 Old myocardial infarction: Secondary | ICD-10-CM

## 2015-05-17 DIAGNOSIS — M1990 Unspecified osteoarthritis, unspecified site: Secondary | ICD-10-CM | POA: Diagnosis present

## 2015-05-17 DIAGNOSIS — I69351 Hemiplegia and hemiparesis following cerebral infarction affecting right dominant side: Secondary | ICD-10-CM

## 2015-05-17 DIAGNOSIS — Z7952 Long term (current) use of systemic steroids: Secondary | ICD-10-CM

## 2015-05-17 DIAGNOSIS — Z7982 Long term (current) use of aspirin: Secondary | ICD-10-CM

## 2015-05-17 DIAGNOSIS — F1410 Cocaine abuse, uncomplicated: Secondary | ICD-10-CM | POA: Diagnosis present

## 2015-05-17 DIAGNOSIS — Z96642 Presence of left artificial hip joint: Secondary | ICD-10-CM | POA: Diagnosis present

## 2015-05-17 DIAGNOSIS — F17210 Nicotine dependence, cigarettes, uncomplicated: Secondary | ICD-10-CM | POA: Diagnosis present

## 2015-05-17 DIAGNOSIS — I69922 Dysarthria following unspecified cerebrovascular disease: Secondary | ICD-10-CM

## 2015-05-17 DIAGNOSIS — E876 Hypokalemia: Secondary | ICD-10-CM | POA: Diagnosis present

## 2015-05-17 DIAGNOSIS — R079 Chest pain, unspecified: Principal | ICD-10-CM | POA: Diagnosis present

## 2015-05-17 DIAGNOSIS — F0390 Unspecified dementia without behavioral disturbance: Secondary | ICD-10-CM | POA: Diagnosis present

## 2015-05-17 DIAGNOSIS — I10 Essential (primary) hypertension: Secondary | ICD-10-CM | POA: Diagnosis present

## 2015-05-17 DIAGNOSIS — Z720 Tobacco use: Secondary | ICD-10-CM | POA: Diagnosis present

## 2015-05-17 DIAGNOSIS — F1010 Alcohol abuse, uncomplicated: Secondary | ICD-10-CM | POA: Diagnosis present

## 2015-05-17 DIAGNOSIS — R7989 Other specified abnormal findings of blood chemistry: Secondary | ICD-10-CM

## 2015-05-17 DIAGNOSIS — Z96653 Presence of artificial knee joint, bilateral: Secondary | ICD-10-CM | POA: Diagnosis present

## 2015-05-17 DIAGNOSIS — D72829 Elevated white blood cell count, unspecified: Secondary | ICD-10-CM | POA: Diagnosis present

## 2015-05-17 DIAGNOSIS — I6300 Cerebral infarction due to thrombosis of unspecified precerebral artery: Secondary | ICD-10-CM

## 2015-05-17 DIAGNOSIS — E785 Hyperlipidemia, unspecified: Secondary | ICD-10-CM | POA: Diagnosis present

## 2015-05-17 DIAGNOSIS — K219 Gastro-esophageal reflux disease without esophagitis: Secondary | ICD-10-CM | POA: Diagnosis present

## 2015-05-17 DIAGNOSIS — I6330 Cerebral infarction due to thrombosis of unspecified cerebral artery: Secondary | ICD-10-CM | POA: Diagnosis present

## 2015-05-17 DIAGNOSIS — Z79899 Other long term (current) drug therapy: Secondary | ICD-10-CM

## 2015-05-17 DIAGNOSIS — D751 Secondary polycythemia: Secondary | ICD-10-CM | POA: Diagnosis present

## 2015-05-17 DIAGNOSIS — I4891 Unspecified atrial fibrillation: Secondary | ICD-10-CM | POA: Diagnosis present

## 2015-05-17 DIAGNOSIS — Z9114 Patient's other noncompliance with medication regimen: Secondary | ICD-10-CM

## 2015-05-17 DIAGNOSIS — R778 Other specified abnormalities of plasma proteins: Secondary | ICD-10-CM | POA: Diagnosis present

## 2015-05-17 LAB — CBC
HCT: 42 % (ref 36.0–46.0)
Hemoglobin: 14.6 g/dL (ref 12.0–15.0)
MCH: 31.3 pg (ref 26.0–34.0)
MCHC: 34.8 g/dL (ref 30.0–36.0)
MCV: 89.9 fL (ref 78.0–100.0)
PLATELETS: 268 10*3/uL (ref 150–400)
RBC: 4.67 MIL/uL (ref 3.87–5.11)
RDW: 13.6 % (ref 11.5–15.5)
WBC: 13.2 10*3/uL — AB (ref 4.0–10.5)

## 2015-05-17 MED ORDER — FOLIC ACID 1 MG PO TABS: 1.0000 mg | ORAL_TABLET | Freq: Every day | ORAL | Status: DC

## 2015-05-17 MED ORDER — FOLIC ACID 1 MG PO TABS: 1.0000 mg | ORAL_TABLET | Freq: Every day | ORAL | Status: AC

## 2015-05-17 MED ORDER — NICOTINE 14 MG/24HR TD PT24: 14.0000 mg | MEDICATED_PATCH | Freq: Every day | TRANSDERMAL | Status: DC

## 2015-05-17 MED ORDER — ATORVASTATIN CALCIUM 40 MG PO TABS: 40.0000 mg | ORAL_TABLET | Freq: Every day | ORAL | Status: DC

## 2015-05-17 MED ORDER — HYDRALAZINE HCL 100 MG PO TABS: 50.0000 mg | ORAL_TABLET | Freq: Three times a day (TID) | ORAL | Status: DC

## 2015-05-17 MED ORDER — THIAMINE HCL 100 MG PO TABS
100.0000 mg | ORAL_TABLET | Freq: Every day | ORAL | Status: DC
Start: 2015-05-17 — End: 2015-05-17

## 2015-05-17 MED ORDER — ASPIRIN 325 MG PO TABS: 325.0000 mg | ORAL_TABLET | Freq: Every day | ORAL | Status: DC

## 2015-05-17 MED ORDER — NITROGLYCERIN 0.4 MG SL SUBL: 0.4000 mg | SUBLINGUAL_TABLET | SUBLINGUAL | Status: DC | PRN

## 2015-05-17 MED ORDER — THIAMINE HCL 100 MG PO TABS: 100.0000 mg | ORAL_TABLET | Freq: Every day | ORAL | Status: AC

## 2015-05-17 MED ORDER — ATORVASTATIN CALCIUM 40 MG PO TABS: 40.0000 mg | ORAL_TABLET | Freq: Every day | ORAL | Status: AC

## 2015-05-17 MED ORDER — ASPIRIN 81 MG PO CHEW: 81.0000 mg | CHEWABLE_TABLET | Freq: Once | ORAL | Status: DC

## 2015-05-17 NOTE — Evaluation (Signed)
Physical Therapy Evaluation Patient Details Name: [name] MRN: [id number] DOB: [birth year] Today's Date: 05/17/2015   History of Present Illness  Patient is a right-hand dominant 59 year old female who has a past medical history significant for HTN, atrial fibrillation, MI, tobacco abuse, polysubstance abuse, and left total hip revision 2013; who presented with complaints of slurred speech and recurrent falls. MRI: Acute left ACA territory infarct, and  Remote left occipital cortical infarct, with additional remote lacunar infarcts involving the left basal ganglia/corona radiata and left thalamus  Clinical Impression  Pt was referred to PT for assessment of mobiltiy and needs, and did note a great deal of weakness on RLE , struggle to stand, balance, walk.  Will expect pt to be admitted for rehab inpt and will continue her therapy later with HHPT    Follow Up Recommendations CIR    Equipment Recommendations  Rolling walker with 5" wheels    Recommendations for Other Services Rehab consult     Precautions / Restrictions Precautions Precautions: Fall Precaution Comments: RLE weak and contributing by buckling Restrictions Weight Bearing Restrictions: No      Mobility  Bed Mobility               General bed mobility comments:  (up in recliner when PT and OT arrived)  Transfers Overall transfer level: Needs assistance Equipment used: Rolling walker (2 wheeled) Transfers: Sit to/from [employer] CorporationStand;Stand Pivot Transfers Sit to Stand: Mod assist;Max assist Stand pivot transfers: Mod assist       General transfer comment: Pt cannot stabilize on her RLE and needs close help to power up and control initial standing balance  Ambulation/Gait Ambulation/Gait assistance: Mod assist;Min assist Ambulation Distance (Feet): 100 Feet Assistive device: Rolling walker (2 wheeled);1 person hand held assist Gait Pattern/deviations: Step-to pattern;Decreased stance time - right;Decreased  stride length;Wide base of support;Trunk flexed Gait velocity: slower Gait velocity interpretation: Below normal speed for age/gender General Gait Details: unable to control RLE without occass buckling when pt distracted and leaning on it more  Stairs            Wheelchair Mobility    Modified Rankin (Stroke Patients Only)       Balance Overall balance assessment: Needs assistance Sitting-balance support: Feet supported Sitting balance-Leahy Scale: Fair   Postural control: Posterior lean Standing balance support: Bilateral upper extremity supported Standing balance-Leahy Scale: Poor Standing balance comment: very unsafe the more dynamic the standing                             Pertinent Vitals/Pain Pain Assessment: Faces Pain Score: 2  Faces Pain Scale: Hurts little more Pain Location: R hip with gait Pain Descriptors / Indicators: Aching Pain Intervention(s): Monitored during session    Home Living Family/patient expects to be discharged to:: Inpatient rehab Living Arrangements: Alone Available Help at Discharge: Family;Friend(s);Available 24 hours/day Type of Home: Apartment Home Access: Stairs to enter;Other (comment) (alternate entrance with one step but long incline)   Entrance Stairs-Number of Steps: 3+3+6 Home Layout: Two level Home Equipment: Walker - 4 wheels;Cane - single point      Prior Function Level of Independence: Independent               Hand Dominance   Dominant Hand: Right    Extremity/Trunk Assessment   Upper Extremity Assessment: Defer to OT evaluation RUE Deficits / Details: Movements are more slow and deliberate compared to LUE, decreased finger opposition coordination  LUE Deficits / Details: Noted mild tremors/shaking with finger to nose   Lower Extremity Assessment: RLE deficits/detail RLE Deficits / Details: R knee 3-. ankle 3-    Cervical / Trunk Assessment: Normal  Communication   Communication:  Expressive difficulties (slurred speech and difficult to understand at times)  Cognition Arousal/Alertness: Awake/alert Behavior During Therapy: WFL for tasks assessed/performed Overall Cognitive Status: Impaired/Different from baseline Area of Impairment: Attention;Safety/judgement;Awareness;Problem solving   Current Attention Level: Alternating     Safety/Judgement: Decreased awareness of safety;Decreased awareness of deficits Awareness: Intellectual Problem Solving: Slow processing;Difficulty sequencing;Requires verbal cues General Comments: Pt requires close contact with gait belt to compensate her buckling on RLE    General Comments General comments (skin integrity, edema, etc.): Pt up in chair and able to assist with all movement but very unaware of her deficits.  Pt is highfall risk unless in an inpt setting due to limited family help and likelihood of a home helper losing focus    Exercises        Assessment/Plan    PT Assessment Patient needs continued PT services  PT Diagnosis Difficulty walking;Generalized weakness   PT Problem List Decreased strength;Decreased range of motion;Decreased activity tolerance;Decreased balance;Decreased mobility;Decreased coordination;Decreased cognition;Decreased knowledge of use of DME;Decreased safety awareness;Decreased knowledge of precautions;Cardiopulmonary status limiting activity;Pain;Decreased skin integrity  PT Treatment Interventions DME instruction;Gait training;Stair training;Functional mobility training;Therapeutic activities;Therapeutic exercise;Balance training;Neuromuscular re-education;Cognitive remediation;Patient/family education   PT Goals (Current goals can be found in the Care Plan section) Acute Rehab PT Goals Patient Stated Goal: get home PT Goal Formulation: With patient Time For Goal Achievement: 05/31/15 Potential to Achieve Goals: Good    Frequency Min 3X/week   Barriers to discharge Inaccessible home  environment;Decreased caregiver support has large number of steps or incline to enter house    Co-evaluation PT/OT/SLP Co-Evaluation/Treatment: Yes Reason for Co-Treatment: For patient/therapist safety;Complexity of the patient's impairments (multi-system involvement) PT goals addressed during session: Mobility/safety with mobility;Balance OT goals addressed during session: ADL's and self-care       End of Session Equipment Utilized During Treatment: Gait belt Activity Tolerance: Patient tolerated treatment well Patient left: in chair;with call bell/phone within reach;with chair alarm set;with family/visitor present Nurse Communication: Mobility status         Time: 1207-1238 PT Time Calculation (min) (ACUTE ONLY): 31 min   Charges:   PT Evaluation $Initial PT Evaluation Tier I: 1 Procedure     PT G CodesIvar Drape 2015-05-18, 3:31 PM   Samul Dada, PT MS Acute Rehab Dept. Number: [facility] [id number] and [facility] [phone]

## 2015-05-17 NOTE — Discharge Summary (Addendum)
[name], is a 59 y.o. female  DOB 30-Jun-1956  MRN [id number].  Admission date:  05/14/2015  Admitting Physician  Clydie Braunondell A Smith, MD  Discharge Date:  05/17/2015   Primary MD  [name],[name], MD  Recommendations for primary care physician for things to follow:    Monitor secondary factors for stroke, monitor cocaine and alcohol abuse.   Admission Diagnosis  Cerebrovascular accident (CVA), unspecified mechanism (HCC) [I63.9]   Discharge Diagnosis  Cerebrovascular accident (CVA), unspecified mechanism (HCC) [I63.9]     Principal Problem:   CVA (cerebral infarction) Active Problems:   Tobacco abuse   Alcohol abuse   Cocaine abuse   Accelerated hypertension   Leukocytosis   Polycythemia   Hypocalcemia   Cerebrovascular accident (CVA) (HCC)      Past Medical History  Diagnosis Date  . Hypertension   . Complication of anesthesia     Heart attack during surgery  . Myocardial infarction (HCC)   . Heart murmur   . Shortness of breath   . Pneumonia     hx of pna  . Dysrhythmia     atrial fibrilation  . GERD (gastroesophageal reflux disease)   . Arthritis     Past Surgical History  Procedure Laterality Date  . Total hip arthroplasty  2010  . Replacement total knee bilateral    . Shoulder arthroscopy w/ rotator cuff repair    . Skin graft    . Abdominal hysterectomy    . Hip surgery  12/09/2011    revision  . Total hip revision  12/09/2011    Procedure: TOTAL HIP REVISION;  Surgeon: Kennieth RadArthur F Carter, MD;  [facility];  Service: Orthopedics;  Laterality: Left;       HPI  from the history and physical done on the day of admission:   Patient is a right-hand dominant 59 year old female who has a past medical history significant for HTN, atrial fibrillation, MI, tobacco abuse, and  polysubstance abuse; who presented with complaints of slurred speech and recurrent falls. Patient is present with her and provides additional history. The patient herself is somewhat of a poor historian as she is vague on specific details. Patient states the symptoms started anywhere from 2 days - 1 week ago. Patient states that about 1 week ago she started having issues with her right lower extremity turning inward. [name] and states that she's been falling frequently during this time, but cannot quantify. In the last 2 days she noted more significant difficulty in getting her words out and slurred speech.   Her [name] states that [place]  and was able to tell him that something was not right with her speech and she was having difficulty getting her words out. She called EMS at that time. [name] notes that she noticed that her right side of her face is been drooping Some and she's had decreased sensation on that side as Upon the patient's [name]'s main concern is that she lives alone and  likely cannot continue to do so at this time and her current state.   Upon arrival into the emergency department initial CT scan showed signs of an acute or subacute left ACA territory infarct, and a remote lacunar type infarct in the left caudate area. Stroke pager was fired and neurology evaluated the patient. Initial lab work including UDS was positive for cocaine and blood alcohol level was less than 5. With family out of the room patient admits she last used cocaine yesterday.      Hospital Course:     Acute/subacute CVA: Suspect embolic given history of ? atrial fibrillation,cocaine use-some concern for infective endocarditis-however blood cultures neg and TEE negative for vegetation or embolic source. Speech improved, now only slightly dysarthric, mild right-sided hemiparesis 4/5. Stable carotid Doppler, PT/OT evaluation done, qualifies for SNF  placement.  Discussed with neurology able for discharge on aspirin and statin, outpatient loop recorder placement. Discussed with EP physician Dr. Sherryl Manges he will arrange for loop recorder placement on Monday or Tuesday, cannot be done over the weekend. He advises discharge with outpatient follow-up. His office will arrange for loop recorder sewn. Note A1c 6.6-Will emphasize diet/lifestyle modifications, LDL 134 (goal < 70)-started Lipitor.  Having stated above patient appears not to be the best candidate for long-term anticoagulation due to her ongoing alcohol and cocaine abuse. High fall risk.    Reported history of paroxysmal atrial fibrillation: Reviewed numerous EKGs available in Epic-no evidence of atrophic fibrillation, telemetry negative as well. Patient on digoxin and Coreg prior to this admission. Have his chest with EP physician Dr. Graciela Husbands and neurologist on call today, discharged on aspirin with outpatient loop recorder placement likely on coming Monday or Tuesday. Cardiology will arrange for the same. Tried to contact PCP not available.  Essential hypertension: Placed on hydralazine upon discharge. Coreg as she has positive cocaine use.  Dyslipidemia: Laced on statin.  Mild polycythemia: Suspect in a setting of hemoconcentration-patient has been hydrated-hemoglobin down to 16.3-we'll continue to follow CBCs. Will request PCP or SNF M.D. to repeat CBC in 1 week.  Tobacco abuse: Counseled-continue transdermal nicotine  Cocaine abuse: Counseled-watch for withdrawal. Start beta blocker.  Mild troponin bump in non-ACS pattern without chest pain, likely due to cocaine abuse. Outpatient cardiology follow-up. On aspirin and statin. Cannot use beta blocker due to cocaine use.   EtOH abuse: Drinks 40 ounces of beer on a daily basis-no signs of counseled to quit alcohol and cocaine.  Chronic Steroid QMV:HQIONGE on it for arthritis-per patient on decadron for 6 months-will defer taper to  outpatient setting.  Moderate AR: Outpatient follow-up with cardiology. No evidence of vegetation on TEE.  No further inpatient workup.      Discharge Condition: Stable  Follow UP  Follow-up Information    Follow up with HILL,[name], MD. Schedule an appointment as soon as possible for a visit in 1 week.   Specialty:  Family Medicine   Contact information:   [address] [place] [address] [phone]       Follow up with Sherryl Manges, MD. Schedule an appointment as soon as possible for a visit in 1 week.   Specialty:  Cardiology   Why:  Loop recorder   Contact information:   [address] [place] [address] [phone]       Follow up with [facility]. Schedule an appointment as soon as possible for a visit in 1 week.   Why:  CVA  Contact information:   [address]     [address] [place] [address] [phone]       Consults obtained - Neuro, Cards  Diet and Activity recommendation: See Discharge Instructions below  Discharge Instructions       Discharge Instructions    Diet - low sodium heart healthy    Complete by:  As directed      Discharge instructions    Complete by:  As directed   Follow with Primary MD HILL,[name], MD in 7 days   Get CBC, CMP, 2 view Chest X ray checked  by Primary MD next visit.    Activity: As tolerated with Full fall precautions use walker/cane & assistance as needed   Disposition SNF   Diet: Heart Healthy   For Heart failure patients - Check your Weight same time everyday, if you gain over 2 pounds, or you develop in leg swelling, experience more shortness of breath or chest pain, call your Primary MD immediately. Follow Cardiac Low Salt Diet and 1.5 lit/day fluid restriction.   On your next visit with your primary care physician please Get Medicines reviewed and adjusted.   Please request your Prim.MD to go over all Hospital Tests and  Procedure/Radiological results at the follow up, please get all Hospital records sent to your Prim MD by signing hospital release before you go home.   If you experience worsening of your admission symptoms, develop shortness of breath, life threatening emergency, suicidal or homicidal thoughts you must seek medical attention immediately by calling 911 or calling your MD immediately  if symptoms less severe.  You Must read complete instructions/literature along with all the possible adverse reactions/side effects for all the Medicines you take and that have been prescribed to you. Take any new Medicines after you have completely understood and accpet all the possible adverse reactions/side effects.   Do not drive, operating heavy machinery, perform activities at heights, swimming or participation in water activities or provide baby sitting services if your were admitted for syncope or siezures until you have seen by Primary MD or a Neurologist and advised to do so again.  Do not drive when taking Pain medications.    Do not take more than prescribed Pain, Sleep and Anxiety Medications  Special Instructions: If you have smoked or chewed Tobacco  in the last 2 yrs please stop smoking, stop any regular Alcohol  and or any Recreational drug use.  Wear Seat belts while driving.   Please note  You were cared for by a hospitalist during your hospital stay. If you have any questions about your discharge medications or the care you received while you were in the hospital after you are discharged, you can call the unit and asked to speak with the hospitalist on call if the hospitalist that took care of you is not available. Once you are discharged, your primary care physician will handle any further medical issues. Please note that NO REFILLS for any discharge medications will be authorized once you are discharged, as it is imperative that you return to your primary care physician (or establish a  relationship with a primary care physician if you do not have one) for your aftercare needs so that they can reassess your need for medications and monitor your lab values.     Increase activity slowly    Complete by:  As directed              Discharge Medications  Medication List    STOP taking these medications        carvedilol 12.5 MG tablet  Commonly known as:  COREG     HYDROcodone-acetaminophen 10-325 MG tablet  Commonly known as:  NORCO      TAKE these medications        aspirin 325 MG tablet  Take 1 tablet (325 mg total) by mouth daily.     atorvastatin 40 MG tablet  Commonly known as:  LIPITOR  Take 1 tablet (40 mg total) by mouth daily at 6 PM.     cyclobenzaprine 10 MG tablet  Commonly known as:  FLEXERIL  Take 10 mg by mouth 3 (three) times daily as needed. As needed for muscle spasms.     dexamethasone 4 MG tablet  Commonly known as:  DECADRON  Take 4 mg by mouth daily.     digoxin 0.25 MG tablet  Commonly known as:  LANOXIN  Take 250 mcg by mouth daily.     folic acid 1 MG tablet  Commonly known as:  FOLVITE  Take 1 tablet (1 mg total) by mouth daily.     hydrALAZINE 100 MG tablet  Commonly known as:  APRESOLINE  Take 0.5 tablets (50 mg total) by mouth 3 (three) times daily.     nicotine 14 mg/24hr patch  Commonly known as:  NICODERM CQ - dosed in mg/24 hours  Place 1 patch (14 mg total) onto the skin daily.     thiamine 100 MG tablet  Take 1 tablet (100 mg total) by mouth daily.        Major procedures and Radiology Reports - PLEASE review detailed and final reports for all details, in brief -   TTE  Left ventricle: The cavity size was normal. There was mild concentric hypertrophy. Systolic function was normal. The estimated ejection fraction was in the range of 60% to 65%. Wall motion was normal; there were no regional wall motion abnormalities. Features are consistent with a pseudonormal left ventricular filling  pattern, with concomitant abnormal relaxation and increased filling pressure (grade 2 diastolic dysfunction). - Aortic valve: Mild focal thickening involving the noncoronary cusp. There was mild to moderate regurgitation directed centrally in the LVOT. - Left atrium: The atrium was mildly dilated. - Atrial septum: No defect or patent foramen ovale was identified. - Tricuspid valve: There was moderate regurgitation. - Pulmonary arteries: PA peak pressure: 35 mm Hg (S).  Recommendations: The mechanism of aortic insufficiency is not clear. While vegetations are not seen, endocarditis should be considered and TEE is probably indicated.    TEE Results: Normal LV size and function Normal RV size and function Normal RA Normal LA and LA appendage Normal TV Normal PV Normal MV with trivial MR Normal trileaflet AV with possible mild prolapse of the right coronary cups with moderate eccentric aortic insufficiency Normal interatrial septum with no evidence of shunt by colorflow dopper and agitated saline contrast study. Normal thoracic and ascending aorta.  No obvious source of emboli.   The patient tolerated the procedure well and was transferred back to their room in stable condition.    Carotid duplex completed.   Preliminary report: Bilateral: 1-39% ICA stenosis. Vertebral artery flow is antegrade.   Dg Chest 2 View  05/15/2015  CLINICAL DATA:  59 year old female with a history of stroke, weakness EXAM: CHEST - 2 VIEW COMPARISON:  08/12/2014 FINDINGS: Cardiomediastinal silhouette unchanged. Atherosclerotic calcifications of the aortic arch. Stigmata of emphysema, with increased retrosternal airspace, flattened  hemidiaphragms, increased AP diameter, and hyperinflation on the AP view. Similar appearance of interstitial opacities with no confluent airspace disease. No pleural effusion or pneumothorax. No interlobular septal thickening. Degenerative changes of the spine.  Unremarkable appearance of the upper abdomen. IMPRESSION: Emphysema and chronic lung scarring with no definite evidence of superimposed acute cardiopulmonary disease. Signed, Yvone Neu. Loreta Ave, DO [facility] Electronically Signed   By: Gilmer Mor D.O.   On: 05/15/2015 06:59   Ct Head Wo Contrast  05/14/2015  CLINICAL DATA:  2-5 day history of headache, blurry vision, slurred speech and right hand and leg pain. EXAM: CT HEAD WITHOUT CONTRAST TECHNIQUE: Contiguous axial images were obtained from the base of the skull through the vertex without intravenous contrast. COMPARISON:  05/24/2014. FINDINGS: There is an area of low attenuation in the central aspect of the left frontal lobe in the ACA territory consistent with a acute or subacute cortical infarct. No hemorrhage. No hemispheric infarction. Remote lacunar type infarct noted in the left caudate area. The brainstem and cerebellum are normal. The bony structures are intact. The paranasal sinuses and mastoid air cells are clear. IMPRESSION: 1. Acute or subacute left ACA territory infarct.  No hemorrhage. 2. Remote lacunar type infarct in the left caudate area. These results were called by telephone at the time of interpretation on 05/14/2015 at 11:11 pm to Dr. Azalia Bilis , who verbally acknowledged these results. Electronically Signed   By: Rudie Meyer M.D.   On: 05/14/2015 23:11   Mr Brain Wo Contrast  05/15/2015  CLINICAL DATA:  Initial evaluation for right lower extremity weakness. EXAM: MRI HEAD WITHOUT CONTRAST MRA HEAD WITHOUT CONTRAST TECHNIQUE: Multiplanar, multiecho pulse sequences of the brain and surrounding structures were obtained without intravenous contrast. Angiographic images of the head were obtained using MRA technique without contrast. COMPARISON:  Prior CT from 05/14/2015. FINDINGS: MRI HEAD FINDINGS Patchy and confluent areas of restricted diffusion seen throughout the  parasagittal left frontal lobe extending posteriorly towards the left parietal lobe, consistent with acute left ACA territory infarct. Largest confluent area of restricted diffusion measures 3.2 x 2.0 cm. There is involvement of the anterior left corpus callosum. Associated gyral swelling without significant mass effect. Faint petechial hemorrhage without hemorrhagic transformation. No right cerebral or infratentorial infarcts. Major intravascular flow voids are maintained. No acute or chronic intracranial hemorrhage. Age-related cerebral atrophy with mild chronic microvascular ischemic disease. Remote lacunar infarct within the left basal ganglia/corona radiata. Probable small additional remote lacunar infarct within the left thalamus. Remote cortical infarct within the parasagittal left occipital lobe. No mass lesion, midline shift, or mass effect. No hydrocephalus. No extra-axial fluid collection. Craniocervical junction within normal limits. Mild degenerative spondylolysis within the upper cervical spine. Pituitary gland normal.  No acute abnormality about the orbits. Paranasal sinuses are clear. No mastoid effusion. Inner ear structures within normal limits. Bone marrow signal intensity normal. No scalp soft tissue abnormality. MRA HEAD FINDINGS ANTERIOR CIRCULATION: Visualized distal cervical segments of the internal carotid arteries are patent with antegrade flow. The petrous, cavernous, and supraclinoid segments are patent without high-grade stenosis. Left A1 segment patent. Right A1 segment hypoplastic. Anterior communicating artery normal. Atheromatous irregularity within the right A2 segment. There is occlusion of the left A2 segment (series 6, image 97). M1 segments opacified without proximal arterial branch occlusion. There is focal moderate stenosis within the distal left M1 segment (series 802, image 14). Distal MCA branches are opacified bilaterally. POSTERIOR CIRCULATION: Left vertebral artery  dominant and patent to the vertebrobasilar junction. Diminutive right vertebral artery patent as well. Left posterior inferior cerebral artery patent. Right PICA not visualized. Basilar artery widely patent. Left SCA opacified. Right SCA is faintly visualized on time-of-flight. Right P1 segment widely patent. There is a focal moderate to severe stenosis within the mid right P2 segment (series 803, image 18). Left P1 segment patent. There is apparent occlusion of the proximal left P2 segment. Patent left posterior communicating artery noted. No aneurysm. IMPRESSION: MRI HEAD IMPRESSION: 1. Acute left ACA territory infarct. There is faint petechial hemorrhage without hemorrhagic transformation or significant mass effect. 2. Remote left occipital cortical infarct, with additional remote lacunar infarcts involving the left basal ganglia/corona radiata and left thalamus. 3. Mild chronic small vessel ischemic disease. MRA HEAD IMPRESSION: 1. Occlusion of the proximal left A2 segment. 2. Occlusion of the proximal left PCA, likely chronic given the chronic left occipital infarct. 3. Focal moderate to severe stenosis within the mid right P2 segment. 4. Focal moderate stenosis within the distal left M1 segment. 5. Multifocal atheromatous irregularity within the right ACA. Electronically Signed   By: Rise Mu M.D.   On: 05/15/2015 04:58   Mr Palma Holter  05/15/2015  CLINICAL DATA:  Initial evaluation for right lower extremity weakness. EXAM: MRI HEAD WITHOUT CONTRAST MRA HEAD WITHOUT CONTRAST TECHNIQUE: Multiplanar, multiecho pulse sequences of the brain and surrounding structures were obtained without intravenous contrast. Angiographic images of the head were obtained using MRA technique without contrast. COMPARISON:  Prior CT from 05/14/2015. FINDINGS: MRI HEAD FINDINGS Patchy and confluent areas of restricted diffusion seen throughout the parasagittal left frontal lobe extending posteriorly towards the left  parietal lobe, consistent with acute left ACA territory infarct. Largest confluent area of restricted diffusion measures 3.2 x 2.0 cm. There is involvement of the anterior left corpus callosum. Associated gyral swelling without significant mass effect. Faint petechial hemorrhage without hemorrhagic transformation. No right cerebral or infratentorial infarcts. Major intravascular flow voids are maintained. No acute or chronic intracranial hemorrhage. Age-related cerebral atrophy with mild chronic microvascular ischemic disease. Remote lacunar infarct within the left basal ganglia/corona radiata. Probable small additional remote lacunar infarct within the left thalamus. Remote cortical infarct within the parasagittal left occipital lobe. No mass lesion, midline shift, or mass effect. No hydrocephalus. No extra-axial fluid collection. Craniocervical junction within normal limits. Mild degenerative spondylolysis within the upper cervical spine. Pituitary gland normal.  No acute abnormality about the orbits. Paranasal sinuses are clear. No mastoid effusion. Inner ear structures within normal limits. Bone marrow signal intensity normal. No scalp soft tissue abnormality. MRA HEAD FINDINGS ANTERIOR CIRCULATION: Visualized distal cervical segments of the internal carotid arteries are patent with antegrade flow. The petrous, cavernous, and supraclinoid segments are patent without high-grade stenosis. Left A1 segment patent. Right A1 segment hypoplastic. Anterior communicating artery normal. Atheromatous irregularity within the right A2 segment. There is occlusion of the left A2 segment (series 6, image 97). M1 segments opacified without proximal arterial branch occlusion. There is focal moderate stenosis within the distal left M1 segment (series 802, image 14). Distal MCA branches are opacified bilaterally. POSTERIOR CIRCULATION: Left vertebral artery dominant and patent to the vertebrobasilar junction. Diminutive right  vertebral artery patent as well. Left posterior inferior cerebral artery patent. Right PICA not visualized. Basilar artery widely patent. Left SCA opacified. Right SCA is faintly visualized on time-of-flight. Right P1 segment widely patent. There is a focal moderate to severe stenosis within the mid right P2 segment (series 803, image  18). Left P1 segment patent. There is apparent occlusion of the proximal left P2 segment. Patent left posterior communicating artery noted. No aneurysm. IMPRESSION: MRI HEAD IMPRESSION: 1. Acute left ACA territory infarct. There is faint petechial hemorrhage without hemorrhagic transformation or significant mass effect. 2. Remote left occipital cortical infarct, with additional remote lacunar infarcts involving the left basal ganglia/corona radiata and left thalamus. 3. Mild chronic small vessel ischemic disease. MRA HEAD IMPRESSION: 1. Occlusion of the proximal left A2 segment. 2. Occlusion of the proximal left PCA, likely chronic given the chronic left occipital infarct. 3. Focal moderate to severe stenosis within the mid right P2 segment. 4. Focal moderate stenosis within the distal left M1 segment. 5. Multifocal atheromatous irregularity within the right ACA. Electronically Signed   By: Rise Mu M.D.   On: 05/15/2015 04:58    Micro Results      Recent Results (from the past 240 hour(s))  Culture, blood (routine x 2)     Status: None (Preliminary result)   Collection Time: 05/15/15 11:52 AM  Result Value Ref Range Status   Specimen Description BLOOD LEFT HAND  Final   Special Requests BOTTLES DRAWN AEROBIC AND ANAEROBIC 5CC  Final   Culture NO GROWTH 1 DAY  Final   Report Status PENDING  Incomplete  Culture, blood (routine x 2)     Status: None (Preliminary result)   Collection Time: 05/15/15 12:00 PM  Result Value Ref Range Status   Specimen Description BLOOD RIGHT HAND  Final   Special Requests BOTTLES DRAWN AEROBIC AND ANAEROBIC 5CC  Final    Culture NO GROWTH 1 DAY  Final   Report Status PENDING  Incomplete       Today   Subjective    [name] today has no headache,no chest abdominal pain,no new weakness tingling or numbness, feels much better.   Objective   Blood pressure 163/79, pulse 70, temperature 98.6 F (37 C), temperature source Oral, resp. rate 16, height 5\' 5"  (1.651 m), weight 61.326 kg (135 lb 3.2 oz), SpO2 100 %.   Intake/Output Summary (Last 24 hours) at 05/17/15 1007 Last data filed at 05/17/15 0943  Gross per 24 hour  Intake 2418.33 ml  Output      0 ml  Net 2418.33 ml    Exam Awake Alert,  No new F.N deficits, Normal affect Fleming.AT,PERRAL Supple Neck,No JVD, No cervical lymphadenopathy appriciated.  Symmetrical Chest wall movement, Good air movement bilaterally, CTAB RRR,No Gallops,Rubs or new Murmurs, No Parasternal Heave +ve B.Sounds, Abd Soft, Non tender, No organomegaly appriciated, No rebound -guarding or rigidity. No Cyanosis, Clubbing or edema, No new Rash or bruise   Data Review   CBC w Diff: Lab Results  Component Value Date   WBC 13.2* 05/17/2015   HGB 14.6 05/17/2015   HCT 42.0 05/17/2015   PLT 268 05/17/2015   LYMPHOPCT 30 05/14/2015   MONOPCT 10 05/14/2015   EOSPCT 1 05/14/2015   BASOPCT 0 05/14/2015    CMP: Lab Results  Component Value Date   NA 138 05/16/2015   K 3.8 05/16/2015   CL 105 05/16/2015   CO2 23 05/16/2015   BUN 10 05/16/2015   CREATININE 0.93 05/16/2015   PROT 7.6 05/14/2015   ALBUMIN 3.9 05/14/2015   BILITOT 1.0 05/14/2015   ALKPHOS 90 05/14/2015   AST 33 05/14/2015   ALT 19 05/14/2015  . Lab Results  Component Value Date   HGBA1C 6.6* 05/15/2015    Lab Results  Component  Value Date   CHOL 211* 05/15/2015   HDL 60 05/15/2015   LDLCALC 134* 05/15/2015   TRIG 85 05/15/2015   CHOLHDL 3.5 05/15/2015     Total Time in preparing paper work, data evaluation and todays exam - 35 minutes  Leroy Sea M.D on 05/17/2015 at 10:07  AM  [facility]   Office  [phone]

## 2015-05-17 NOTE — Progress Notes (Signed)
05/17/15 Patient to be discharged home today, IV site removed, and discharge instructions reviewed with patient.

## 2015-05-17 NOTE — ED Notes (Addendum)
[facility] After returning home, daughter administered hydralizine & atorvastatin per orders. Shortly after, pt began experiencing chest pain. Denies SOB, other referred pain. C/o dizziness, blurred vision. Pt shows weakness to R side in excess of what she had when d/c'd to home earlier today. Pt also c/o headache 9/10. Last known well, approx 1630. Given 324mg  ASA and 1 spray of nitro from EMS with no change in chest pain.

## 2015-05-17 NOTE — Evaluation (Signed)
Occupational Therapy Evaluation Patient Details Name: Zoila ShutterRosemary Postel MRN: [id number] DOB: 1955/07/16 Today's Date: 05/17/2015    History of Present Illness Patient is a right-hand dominant 59 year old female who has a past medical history significant for HTN, atrial fibrillation, MI, tobacco abuse, polysubstance abuse, and left total hip revision 2013; who presented with complaints of slurred speech and recurrent falls. MRI: Acute left ACA territory infarct, and  Remote left occipital cortical infarct, with additional remote lacunar infarcts involving the left basal ganglia/corona radiata and left thalamus   Clinical Impression   This 59 yo female admitted with above presents to acute OT with decreased balance, decreases mobility, decreased insight into deficits, reports double vision but is currently this does not seem to impact her functionally, decreased use/coordination of RUE and RLE all affecting her PLOF of Mod I at home for basic and IADLs. She will benefit from acute OT with follow up on CIR to get back to a Mod I level.    Follow Up Recommendations  CIR    Equipment Recommendations   (TBD next venue)       Precautions / Restrictions Precautions Precautions: Fall Restrictions Weight Bearing Restrictions: No      Mobility Bed Mobility               General bed mobility comments: Pt up in recliner upon arrival  Transfers Overall transfer level: Needs assistance Equipment used: Rolling walker (2 wheeled) Transfers: Sit to/from [employer] CorporationStand;Stand Pivot Transfers Sit to Stand: Mod assist Stand pivot transfers: Mod assist            Balance Overall balance assessment: Needs assistance Sitting-balance support: Feet supported;No upper extremity supported Sitting balance-Leahy Scale: Fair     Standing balance support: Bilateral upper extremity supported;During functional activity Standing balance-Leahy Scale: Poor Standing balance comment: posterior lean  intermittently while using RW                            ADL Overall ADL's : Needs assistance/impaired Eating/Feeding: Set up;Supervision/ safety;Sitting   Grooming: Set up;Supervision/safety;Sitting   Upper Body Bathing: Set up;Supervision/ safety;Sitting   Lower Body Bathing: Minimal assistance (with Mod-max A for standing balance)   Upper Body Dressing : Minimal assistance;Sitting   Lower Body Dressing: Moderate assistance (with Mod-max A for standing balance)   Toilet Transfer: Moderate assistance (min A sit<>stand)   Toileting- Clothing Manipulation and Hygiene: Moderate assistance (with Mod-max A for standing balance)               Vision Vision Assessment?: Yes Eye Alignment: Impaired (comment) (looks slightly disconjugate) Ocular Range of Motion: Within Functional Limits Alignment/Gaze Preference: Within Defined Limits Tracking/Visual Pursuits: Able to track stimulus in all quads without difficulty Visual Fields: No apparent deficits Additional Comments: Wears glasses for reading; reports double vision close up however she consistently was able to touch pen in various planes and read 3 lines in a book (she did tilt her head occassionally to read the book--did have reading glasses on          Pertinent Vitals/Pain Pain Assessment: Faces Pain Score: 2  Pain Location: left hip Pain Descriptors / Indicators: Aching Pain Intervention(s): Monitored during session;Repositioned     Hand Dominance Right   Extremity/Trunk Assessment Upper Extremity Assessment Upper Extremity Assessment: LUE deficits/detail;RUE deficits/detail RUE Deficits / Details: Movements are more slow and deliberate compared to LUE, decreased finger opposition coordination LUE Deficits / Details: Noted mild tremors/shaking with finger to  nose           Communication Communication Communication: No difficulties   Cognition Arousal/Alertness: Awake/alert Behavior During  Therapy: WFL for tasks assessed/performed Overall Cognitive Status: Impaired/Different from baseline Area of Impairment: Safety/judgement;Problem solving         Safety/Judgement: Decreased awareness of safety;Decreased awareness of deficits   Problem Solving: Slow processing;Difficulty sequencing;Requires verbal cues;Requires tactile cues                Home Living Family/patient expects to be discharged to:: Inpatient rehab Living Arrangements: Alone Available Help at Discharge: Family;Friend(s);Available 24 hours/day Type of Home: Apartment Home Access: Stairs to enter Entrance Stairs-Number of Steps: 3,3   Home Layout: Two level Alternate Level Stairs-Number of Steps: 13 Alternate Level Stairs-Rails: Left Bathroom Shower/Tub: Chief Strategy Officer: Standard     Home Equipment: Environmental consultant - 4 wheels;Cane - single point;Bedside commode          Prior Functioning/Environment Level of Independence: Independent             OT Diagnosis: Generalized weakness;Cognitive deficits;Disturbance of vision;Hemiplegia dominant side   OT Problem List: Decreased strength;Decreased range of motion;Impaired balance (sitting and/or standing);Pain;Impaired UE functional use;Decreased cognition;Decreased coordination;Decreased knowledge of use of DME or AE   OT Treatment/Interventions: Self-care/ADL training;Patient/family education;Visual/perceptual remediation/compensation;Balance training;Therapeutic activities;DME and/or AE instruction;Cognitive remediation/compensation;Therapeutic exercise    OT Goals(Current goals can be found in the care plan section) Acute Rehab OT Goals Patient Stated Goal: to be able to go home OT Goal Formulation: With patient Time For Goal Achievement: 05/24/15 Potential to Achieve Goals: Good  OT Frequency: Min 3X/week           Co-evaluation PT/OT/SLP Co-Evaluation/Treatment: Yes Reason for Co-Treatment: For patient/therapist  safety   OT goals addressed during session: ADL's and self-care;Strengthening/ROM      End of Session Equipment Utilized During Treatment: Gait belt;Rolling walker  Activity Tolerance: Patient tolerated treatment well Patient left: in chair;with call bell/phone within reach;with chair alarm set;with family/visitor present   Time: 1207-1238 OT Time Calculation (min): 31 min Charges:  OT General Charges $OT Visit: 1 Procedure OT Evaluation $Initial OT Evaluation Tier I: 1 Procedure  Evette Georges [phone] 05/17/2015, 1:25 PM

## 2015-05-17 NOTE — ED Provider Notes (Signed)
CSN: [id number]     Arrival date & time 05/17/15  2315 History  By signing my name below, I, [facility] Hospital[facility]arrissa Washington, attest that this documentation has been prepared under the direction and in the presence of Dione Boozeavid Tiger Spieker, MD. Electronically Signed: Randell PatientMarrissa Washington, ED Scribe. 05/18/2015. 1:32 AM.     Chief Complaint  Patient presents with  . Chest Pain    The history is provided by the patient and a relative. No language interpreter was used.   HPI Comments: Zoila ShutterRosemary Signorelli is a 59 y.o. female who presents to the Emergency Department complaining of 9/10 non-radiating, unchanged chest pain that started 1 hour ago. Family member reports that the patient was released from the hospital after being admitted for a CVA. She was discharged home with Hydralazine and atorvastatin and daughter states patient's symptoms began 1 hour after taking these medications. Patient reports associated diaphoresis, blurred vision, headache, and BLE weakness which is increased over the last 7 hours since being discharge from the hospital. Patient is scheduled to have a pacemaker inserted on Monday.  Patient denies nausea and SOB. Daughter notes the patient has not taken any drugs, ETOH, or cigarettes since leaving the hospital.   Past Medical History  Diagnosis Date  . Hypertension   . Complication of anesthesia     Heart attack during surgery  . Myocardial infarction (HCC)   . Heart murmur   . Shortness of breath   . Pneumonia     hx of pna  . Dysrhythmia     atrial fibrilation  . GERD (gastroesophageal reflux disease)   . Arthritis    Past Surgical History  Procedure Laterality Date  . Total hip arthroplasty  2010  . Replacement total knee bilateral    . Shoulder arthroscopy w/ rotator cuff repair    . Skin graft    . Abdominal hysterectomy    . Hip surgery  12/09/2011    revision  . Total hip revision  12/09/2011    Procedure: TOTAL HIP REVISION;  Surgeon: Kennieth RadArthur F Carter, MD;  [facility];   Service: Orthopedics;  Laterality: Left;   Family History  Problem Relation Age of Onset  . Anesthesia problems Neg Hx    Social History  Substance Use Topics  . Smoking status: Current Some Day Smoker  . Smokeless tobacco: Never Used  . Alcohol Use: Yes     Comment: occ beer   OB History    No data available     Review of Systems  Eyes: Positive for visual disturbance (blurry vision).  Respiratory: Negative for shortness of breath.   Cardiovascular: Positive for chest pain.  Gastrointestinal: Negative for nausea and vomiting.  Neurological: Positive for weakness (BLE) and headaches.      Allergies  Penicillins  Home Medications   Prior to Admission medications   Medication Sig Start Date End Date Taking? Authorizing Provider  aspirin 325 MG tablet Take 1 tablet (325 mg total) by mouth daily. 05/17/15   Leroy SeaPrashant K Singh, MD  atorvastatin (LIPITOR) 40 MG tablet Take 1 tablet (40 mg total) by mouth daily at 6 PM. 05/17/15   Leroy SeaPrashant K Singh, MD  cyclobenzaprine (FLEXERIL) 10 MG tablet Take 10 mg by mouth 3 (three) times daily as needed. As needed for muscle spasms.    Historical Provider, MD  dexamethasone (DECADRON) 4 MG tablet Take 4 mg by mouth daily.    Historical Provider, MD  digoxin (LANOXIN) 0.25 MG tablet Take 250 mcg by mouth daily.  Historical Provider, MD  folic acid (FOLVITE) 1 MG tablet Take 1 tablet (1 mg total) by mouth daily. 05/17/15   Leroy Sea, MD  hydrALAZINE (APRESOLINE) 100 MG tablet Take 0.5 tablets (50 mg total) by mouth 3 (three) times daily. 05/17/15   Leroy Sea, MD  nicotine (NICODERM CQ - DOSED IN MG/24 HOURS) 14 mg/24hr patch Place 1 patch (14 mg total) onto the skin daily. 05/17/15   Leroy Sea, MD  thiamine 100 MG tablet Take 1 tablet (100 mg total) by mouth daily. 05/17/15   Leroy Sea, MD   BP 160/75 mmHg  Pulse 95  Temp(Src) 97.9 F (36.6 C) (Oral)  Resp 25  SpO2 98% Physical Exam  Constitutional: She is  oriented to person, place, and time. She appears well-developed and well-nourished. No distress.  Awake and alert. Slow to answer questions and slow to respond to commands   HENT:  Head: Normocephalic and atraumatic.  Eyes: Conjunctivae and EOM are normal.  Neck: Normal range of motion. Neck supple. No tracheal deviation present.  No carotid bruit  Cardiovascular: Normal rate.   Murmur heard.  Systolic murmur is present with a grade of 2/6  2/6 systolic ejection murmur at the upper right sternal border.   Pulmonary/Chest: Effort normal. No respiratory distress.  Musculoskeletal: Normal range of motion.  Neurological: She is alert and oriented to person, place, and time.  Speech is slow and mildly dysarthric. Questionable right facial droop.  Poorly cooperative for motor exam. Strength 4/5 right arm, 3/5 for right leg, and 5/5 left arm and left leg.   Skin: Skin is warm and dry.  Psychiatric: She has a normal mood and affect. Her behavior is normal.  Nursing note and vitals reviewed.   ED Course  Procedures  DIAGNOSTIC STUDIES: Oxygen Saturation is 98% on RA, normal by my interpretation.    COORDINATION OF CARE: 11:43 PM Discussed treatment plan with pt at bedside and pt agreed to plan.  1:30 AM Patient re-checked. Discussed results of labs and head CT scan with patient. Discussed current medications with patient and daughter. Will order a MRI scan.  Labs Review Results for orders placed or performed during the hospital encounter of 05/17/15  Comprehensive metabolic panel  Result Value Ref Range   Sodium 138 135 - 145 mmol/L   Potassium 3.4 (L) 3.5 - 5.1 mmol/L   Chloride 107 101 - 111 mmol/L   CO2 22 22 - 32 mmol/L   Glucose, Bld 190 (H) 65 - 99 mg/dL   BUN 8 6 - 20 mg/dL   Creatinine, Ser 1.61 0.44 - 1.00 mg/dL   Calcium 9.9 8.9 - 09.6 mg/dL   Total Protein 6.9 6.5 - 8.1 g/dL   Albumin 3.8 3.5 - 5.0 g/dL   AST 25 15 - 41 U/L   ALT 18 14 - 54 U/L   Alkaline  Phosphatase 73 38 - 126 U/L   Total Bilirubin 0.4 0.3 - 1.2 mg/dL   GFR calc non Af Amer >60 >60 mL/min   GFR calc Af Amer >60 >60 mL/min   Anion gap 9 5 - 15  CBC with Differential  Result Value Ref Range   WBC 14.2 (H) 4.0 - 10.5 K/uL   RBC 5.09 3.87 - 5.11 MIL/uL   Hemoglobin 16.2 (H) 12.0 - 15.0 g/dL   HCT 04.5 40.9 - 81.1 %   MCV 88.4 78.0 - 100.0 fL   MCH 31.8 26.0 - 34.0 pg   MCHC  36.0 30.0 - 36.0 g/dL   RDW 16.1 09.6 - 04.5 %   Platelets 284 150 - 400 K/uL   Neutrophils Relative % 73 %   Neutro Abs 10.2 (H) 1.7 - 7.7 K/uL   Lymphocytes Relative 18 %   Lymphs Abs 2.6 0.7 - 4.0 K/uL   Monocytes Relative 9 %   Monocytes Absolute 1.3 (H) 0.1 - 1.0 K/uL   Eosinophils Relative 0 %   Eosinophils Absolute 0.0 0.0 - 0.7 K/uL   Basophils Relative 0 %   Basophils Absolute 0.0 0.0 - 0.1 K/uL  Troponin I  Result Value Ref Range   Troponin I 0.04 (H) <0.031 ng/mL  Urinalysis, Routine w reflex microscopic  Result Value Ref Range   Color, Urine YELLOW YELLOW   APPearance CLOUDY (A) CLEAR   Specific Gravity, Urine 1.013 1.005 - 1.030   pH 6.0 5.0 - 8.0   Glucose, UA >1000 (A) NEGATIVE mg/dL   Hgb urine dipstick NEGATIVE NEGATIVE   Bilirubin Urine NEGATIVE NEGATIVE   Ketones, ur NEGATIVE NEGATIVE mg/dL   Protein, ur NEGATIVE NEGATIVE mg/dL   Urobilinogen, UA 0.2 0.0 - 1.0 mg/dL   Nitrite NEGATIVE NEGATIVE   Leukocytes, UA NEGATIVE NEGATIVE  Ethanol  Result Value Ref Range   Alcohol, Ethyl (B) <5 <5 mg/dL  Urine rapid drug screen (hosp performed)  Result Value Ref Range   Opiates NONE DETECTED NONE DETECTED   Cocaine NONE DETECTED NONE DETECTED   Benzodiazepines NONE DETECTED NONE DETECTED   Amphetamines NONE DETECTED NONE DETECTED   Tetrahydrocannabinol NONE DETECTED NONE DETECTED   Barbiturates NONE DETECTED NONE DETECTED  Urine microscopic-add on  Result Value Ref Range   Squamous Epithelial / LPF FEW (A) RARE   WBC, UA 0-2 <3 WBC/hpf   RBC / HPF 0-2 <3 RBC/hpf    Bacteria, UA FEW (A) RARE   Urine-Other TRICHOMONAS PRESENT   Troponin I  Result Value Ref Range   Troponin I 0.11 (H) <0.031 ng/mL   Imaging Review Ct Head Wo Contrast  05/18/2015  CLINICAL DATA:  Headache and weakness. EXAM: CT HEAD WITHOUT CONTRAST TECHNIQUE: Contiguous axial images were obtained from the base of the skull through the vertex without intravenous contrast. COMPARISON:  Head CT 4 days ago.  Brain MRI from 3 days ago FINDINGS: Skull and Sinuses:Negative for fracture or destructive process. The mastoids, middle ears, and imaged paranasal sinuses are clear. Orbits: No acute abnormality. Brain: Subacute infarct in the left ACA territory, from the left genu of the corpus callosum to the surface of the parasagittal left frontal lobe. No hemorrhagic conversion or evidence of extension. Pre-existing perforator infarct in the left caudate head and putamen and previous small infarct in the parasagittal left occipital lobe. No new infarct detected. No hydrocephalus. No shift. IMPRESSION: 1. No acute finding. 2. Subacute left ACA territory infarcts without evidence of progression or hemorrhagic conversion. 3. Remote left basal ganglia and occipital cortex infarcts. Electronically Signed   By: Marnee Spring M.D.   On: 05/18/2015 01:05   Mr Brain Wo Contrast  05/18/2015  CLINICAL DATA:  Initial evaluation for increased right-sided weakness and dizziness with blurry vision. EXAM: MRI HEAD WITHOUT CONTRAST TECHNIQUE: Multiplanar, multiecho pulse sequences of the brain and surrounding structures were obtained without intravenous contrast. COMPARISON:  Prior CT from earlier same day as well as previous MRI from 05/15/2015. FINDINGS: Again seen is patchy and confluent restricted diffusion involving the left ACA territory, consistent with acute/subacute ischemic infarct. Overall, distribution and  size of this infarct is not significantly changed relative to recent MRI. Again, probable faint petechial  hemorrhage without evidence for hemorrhagic transformation. No significant mass effect. No new areas of infarction. Major intracranial vascular flow voids are maintained. Previously identified left A2 and PCA occlusions not well seen on this exam. Age-related cerebral atrophy with mild chronic small vessel ischemic disease noted, stable. Remote lacunar infarct within the left basal ganglia/ corona radiata. Small remote lacunar infarct within the left thalamus. Additional remote left occipital lobe infarct. No mass lesion, midline shift, or mass effect. No hydrocephalus. No extra-axial fluid collection. Craniocervical junction normal. Mild degenerative spondylolysis within the visualized upper cervical spine. Pituitary gland normal.  No acute abnormality about the orbits. Paranasal sinuses and mastoid air cells are clear. Inner ear structures normal. Bone marrow signal intensity within normal limits. Scalp soft tissues unremarkable. IMPRESSION: 1. Normal expected interval evolution of acute/subacute left ACA territory infarct. No hemorrhagic transformation or other complication identified. 2. No other new or acute intracranial process. 3. Stable atrophy with chronic small vessel ischemic disease and remote infarcts as above. Electronically Signed   By: Rise Mu M.D.   On: 05/18/2015 02:58   I have personally reviewed and evaluated these images and lab results as part of my medical decision-making.   EKG Interpretation   Date/Time:  Saturday May 17 2015 23:29:10 EST Ventricular Rate:  97 PR Interval:  143 QRS Duration: 82 QT Interval:  432 QTC Calculation: 549 R Axis:   63 Text Interpretation:  Sinus rhythm LAE, consider biatrial enlargement  Probable LVH with secondary repol abnrm Prolonged QT interval When  compared with ECG of 05/15/2015, QT has lengthened Confirmed by [facility]  MD,  Tadashi Burkel ([id number]) on 05/17/2015 11:42:33 PM      CRITICAL CARE Performed by: UEAVW,[email or web address] Total  critical care time: 45 minutes Critical care time was exclusive of separately billable procedures and treating other patients. Critical care was necessary to treat or prevent imminent or life-threatening deterioration. Critical care was time spent personally by me on the following activities: development of treatment plan with patient and/or surrogate as well as nursing, discussions with consultants, evaluation of patient's response to treatment, examination of patient, obtaining history from patient or surrogate, ordering and performing treatments and interventions, ordering and review of laboratory studies, ordering and review of radiographic studies, pulse oximetry and re-evaluation of patient's condition.  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 Exam is difficult to interpret as there is for a poor patient cooperation, but I do not see any definite evidence of new stroke. She is sent for CT which shows no acute changes. She was complaining of a headache and was prescribed metoclopramide and diphenhydramine, but headache resolved and she refused those medications. She was sent for MRI scan which showed no evidence of new stroke. Family and patient were advised of this finding. Screening labs were obtained showing mild elevation of troponin. This was for a slightly less than value from 2 days ago. I was concerned whether this was adjusted downward trend from previous elevated troponin or an new cardiac insult. Three-hour troponin was obtained and had risen to 0.11. She's not having ongoing chest pain and does have a recent stroke. I am not certain whether anticoagulation should be done. Does this will be discussed with cardiology and possibly with neurology.  Case is discussed with Dr. Lovell Sheehan of [facility] who agrees to admit the  patient.   I personally performed the services described in this documentation, which was scribed in my presence. The recorded information has been reviewed and is accurate.     Dione Booze, MD 05/18/15 [phone]

## 2015-05-17 NOTE — Discharge Instructions (Signed)
Follow with Primary MD [name],[name], MD in 7 days   Get CBC, CMP, 2 view Chest X ray checked  by Primary MD next visit.    Activity: As tolerated with Full fall precautions use walker/cane & assistance as needed   Disposition SNF   Diet: Heart Healthy   For Heart failure patients - Check your Weight same time everyday, if you gain over 2 pounds, or you develop in leg swelling, experience more shortness of breath or chest pain, call your Primary MD immediately. Follow Cardiac Low Salt Diet and 1.5 lit/day fluid restriction.   On your next visit with your primary care physician please Get Medicines reviewed and adjusted.   Please request your Prim.MD to go over all Hospital Tests and Procedure/Radiological results at the follow up, please get all Hospital records sent to your Prim MD by signing hospital release before you go home.   If you experience worsening of your admission symptoms, develop shortness of breath, life threatening emergency, suicidal or homicidal thoughts you must seek medical attention immediately by calling 911 or calling your MD immediately  if symptoms less severe.  You Must read complete instructions/literature along with all the possible adverse reactions/side effects for all the Medicines you take and that have been prescribed to you. Take any new Medicines after you have completely understood and accpet all the possible adverse reactions/side effects.   Do not drive, operating heavy machinery, perform activities at heights, swimming or participation in water activities or provide baby sitting services if your were admitted for syncope or siezures until you have seen by Primary MD or a Neurologist and advised to do so again.  Do not drive when taking Pain medications.    Do not take more than prescribed Pain, Sleep and Anxiety Medications  Special Instructions: If you have smoked or chewed Tobacco  in the last 2 yrs please stop smoking, stop any regular  Alcohol  and or any Recreational drug use.  Wear Seat belts while driving.   Please note  You were cared for by a hospitalist during your hospital stay. If you have any questions about your discharge medications or the care you received while you were in the hospital after you are discharged, you can call the unit and asked to speak with the hospitalist on call if the hospitalist that took care of you is not available. Once you are discharged, your primary care physician will handle any further medical issues. Please note that NO REFILLS for any discharge medications will be authorized once you are discharged, as it is imperative that you return to your primary care physician (or establish a relationship with a primary care physician if you do not have one) for your aftercare needs so that they can reassess your need for medications and monitor your lab values.

## 2015-05-17 NOTE — Care Management Note (Signed)
Case Management Note  Patient Details  Name: Zoila ShutterRosemary Doss MRN: [id number] Date of Birth: 08-24-1955  Subjective/Objective:                  CVA Action/Plan: dischargeplanning  Expected Discharge Date:  05/17/15               Expected Discharge Plan:  Home w Home Health Services  In-House Referral:     Discharge planning Services  CM Consult  Post Acute Care Choice:  Durable Medical Equipment, Home Health Choice offered to:  Patient  DME Arranged:  3-N-1, Walker rolling DME Agency:  [facility]  HH Arranged:  RN, PT, OT, Nurse's Aide, Social Work [employer] Agency:  [facility]  Status of Service:  Completed, signed off  Medicare Important Message Given:    Date Medicare IM Given:    Medicare IM give by:    Date Additional Medicare IM Given:    Additional Medicare Important Message give by:     If discussed at Long Length of Stay Meetings, dates discussed:    Additional Comments: Cm received call from CSW stating Medicaid will not cover cost of SNF and pt cannot pay out of pocket.   CM spoke with pt who in turn gave the phone to Raliegh IpAngela Whitaker [phone]; Marylene Landngela will be staying with pt and is primary contact.  Pt chooses [facility] tor render HHPT/OT/RN/Aide/SW.  CM text requested HH orders from MD. [facility]. CM called [facility] DME rep, Trey PaulaJeff to please deliver the 3n1 and rolling walker to room prior to discharge.  No other CM needs were communicated. Yves DillJeffries, Mikhaila Roh Christine, RN 05/17/2015, 12:50 PM

## 2015-05-17 NOTE — Social Work (Signed)
CSW spoke with patient and friend, Marylene Landngela, with patient's permission. CS[facility] aware [facility] due to patient's insurance limitations. Marylene Landngela stated that she would be able to provide 24 hour care for patient is necessary. CSW then contacted patient's daughter in order to provide and update for patient. Daughter stated that she would be here today but had questions about HH. CS[facility] and plan that RNCM discussed with Marylene Landngela for more details.  CSW signing off due to patient discharging today with HH.  Beverly Sessionsywan J Amylia Collazos MSW, LCSW [phone]

## 2015-05-17 NOTE — Evaluation (Signed)
Speech Language Pathology Evaluation Patient Details Name: [name] MRN: [id number] DOB: 01/02/1956 Today's Date: 05/17/2015 Time: 1515-     Problem List:  Patient Active Problem List   Diagnosis Date Noted  . Cerebrovascular accident (CVA) (HCC)   . CVA (cerebral infarction) 05/15/2015  . Tobacco abuse 05/15/2015  . Alcohol abuse 05/15/2015  . Cocaine abuse 05/15/2015  . Accelerated hypertension 05/15/2015  . Leukocytosis 05/15/2015  . Polycythemia 05/15/2015  . Hypocalcemia 05/15/2015  . Stroke [facility]) 05/14/2015   Past Medical History:  Past Medical History  Diagnosis Date  . Hypertension   . Complication of anesthesia     Heart attack during surgery  . Myocardial infarction (HCC)   . Heart murmur   . Shortness of breath   . Pneumonia     hx of pna  . Dysrhythmia     atrial fibrilation  . GERD (gastroesophageal reflux disease)   . Arthritis    Past Surgical History:  Past Surgical History  Procedure Laterality Date  . Total hip arthroplasty  2010  . Replacement total knee bilateral    . Shoulder arthroscopy w/ rotator cuff repair    . Skin graft    . Abdominal hysterectomy    . Hip surgery  12/09/2011    revision  . Total hip revision  12/09/2011    Procedure: TOTAL HIP REVISION;  Surgeon: Kennieth Rad, MD;  Location: [facility] OR;  Service: Orthopedics;  Laterality: Left;   HPI:  [name] is a 59 y.o. female w/ PMHx of CAD, GERD, HTN, polysubstance abuse and PAF, presents to the ED w/ complaints of weakness of the right lower extremity and recurrent falls for the past 2-3 days. She also admits to associated dizziness. While in the ED, patient was intoxicated ([place] 277) and UDS positive for cocaine. Initial CT head showed low density region involving left frontal ACA territory. No previous history of CVA, atrial fibrillation history unclear, has never been on anticoagulation. No antiplatelet medications previously and is not compliant w/ HTN medications.    Assessment / Plan / Recommendation Clinical Impression  Pt presents with mild to moderate cognitive deficits and residual dysarthric speech following CVA, however difficult to assess if cognitive deficits are a change from baseline, no family present. Pts highest educational level is 8th grade. Oral motor exam unremarkable, although dysarthric speech noted and mildly affecting intelligibility of expressive language at the conversational level.  Cognitive deficits characterized by decreased recall of novel information, decreased mental flexibility and mental manipulation, decreased safety awareness, and decreased executive function skills. Pt reports that closest family member is daughter who does not live locally. Pts PLOF is independent with all ADLs including medicine and financial managment. Recommended follow up ST services for dysrathria and to enhance thinking skills at next level of care. Per case managment notes pt with insurance limitations and HH services are being pursued.     SLP Assessment  All further Speech Lanaguage Pathology  needs can be addressed in the next venue of care    Follow Up Recommendations  Home health SLP    Frequency and Duration           SLP Evaluation Prior Functioning  Type of Home: Apartment Available Help at Discharge: Family;Friend(s);Available 24 hours/day   Cognition  Overall Cognitive Status: Impaired/Different from baseline Arousal/Alertness: Awake/alert Orientation Level: Oriented X4 Memory: Impaired Memory Impairment: Decreased recall of new information;Decreased short term memory Decreased Short Term Memory: Verbal basic Awareness: Impaired Problem Solving: Impaired  Executive Function: Reasoning;Self Monitoring Reasoning: Impaired Self Monitoring: Impaired Safety/Judgment: Impaired    Comprehension  Auditory Comprehension Overall Auditory Comprehension: Appears within functional limits for tasks assessed Yes/No Questions: Within  Functional Limits Commands: Within Functional Limits Visual Recognition/Discrimination Discrimination: Within Function Limits Reading Comprehension Reading Status: Within funtional limits    Expression Expression Primary Mode of Expression: Verbal Verbal Expression Overall Verbal Expression: Appears within functional limits for tasks assessed Written Expression Dominant Hand: Right Written Expression: Within Functional Limits   Oral / Motor Oral Motor/Sensory Function Overall Oral Motor/Sensory Function: Within functional limits Motor Speech Overall Motor Speech: Impaired Respiration: Within functional limits Resonance: Within functional limits Articulation: Impaired Intelligibility: Intelligibility reduced Word: 50-74% accurate   Marcene Duoshelsea Sumney MA, CCC-SLP Acute Care Speech Language Pathologist    Kennieth RadSumney, [name] E 05/17/2015, 4:03 PM

## 2015-05-18 ENCOUNTER — Emergency Department (HOSPITAL_COMMUNITY): Payer: Medicaid Other

## 2015-05-18 DIAGNOSIS — I4891 Unspecified atrial fibrillation: Secondary | ICD-10-CM | POA: Diagnosis present

## 2015-05-18 DIAGNOSIS — F0390 Unspecified dementia without behavioral disturbance: Secondary | ICD-10-CM | POA: Diagnosis present

## 2015-05-18 DIAGNOSIS — I69351 Hemiplegia and hemiparesis following cerebral infarction affecting right dominant side: Secondary | ICD-10-CM | POA: Diagnosis not present

## 2015-05-18 DIAGNOSIS — Z7952 Long term (current) use of systemic steroids: Secondary | ICD-10-CM | POA: Diagnosis not present

## 2015-05-18 DIAGNOSIS — Z7982 Long term (current) use of aspirin: Secondary | ICD-10-CM | POA: Diagnosis not present

## 2015-05-18 DIAGNOSIS — Z720 Tobacco use: Secondary | ICD-10-CM

## 2015-05-18 DIAGNOSIS — Z9114 Patient's other noncompliance with medication regimen: Secondary | ICD-10-CM | POA: Diagnosis not present

## 2015-05-18 DIAGNOSIS — I10 Essential (primary) hypertension: Secondary | ICD-10-CM

## 2015-05-18 DIAGNOSIS — F1010 Alcohol abuse, uncomplicated: Secondary | ICD-10-CM

## 2015-05-18 DIAGNOSIS — K219 Gastro-esophageal reflux disease without esophagitis: Secondary | ICD-10-CM | POA: Diagnosis present

## 2015-05-18 DIAGNOSIS — Z79899 Other long term (current) drug therapy: Secondary | ICD-10-CM | POA: Diagnosis not present

## 2015-05-18 DIAGNOSIS — F17210 Nicotine dependence, cigarettes, uncomplicated: Secondary | ICD-10-CM | POA: Diagnosis present

## 2015-05-18 DIAGNOSIS — E876 Hypokalemia: Secondary | ICD-10-CM | POA: Diagnosis present

## 2015-05-18 DIAGNOSIS — E785 Hyperlipidemia, unspecified: Secondary | ICD-10-CM | POA: Diagnosis present

## 2015-05-18 DIAGNOSIS — R799 Abnormal finding of blood chemistry, unspecified: Secondary | ICD-10-CM | POA: Diagnosis not present

## 2015-05-18 DIAGNOSIS — D751 Secondary polycythemia: Secondary | ICD-10-CM | POA: Diagnosis present

## 2015-05-18 DIAGNOSIS — I6330 Cerebral infarction due to thrombosis of unspecified cerebral artery: Secondary | ICD-10-CM | POA: Diagnosis not present

## 2015-05-18 DIAGNOSIS — R079 Chest pain, unspecified: Secondary | ICD-10-CM | POA: Diagnosis present

## 2015-05-18 DIAGNOSIS — R7989 Other specified abnormal findings of blood chemistry: Secondary | ICD-10-CM | POA: Diagnosis not present

## 2015-05-18 DIAGNOSIS — M1990 Unspecified osteoarthritis, unspecified site: Secondary | ICD-10-CM | POA: Diagnosis present

## 2015-05-18 DIAGNOSIS — D72829 Elevated white blood cell count, unspecified: Secondary | ICD-10-CM

## 2015-05-18 DIAGNOSIS — I252 Old myocardial infarction: Secondary | ICD-10-CM | POA: Diagnosis not present

## 2015-05-18 DIAGNOSIS — I6990 Unspecified sequelae of unspecified cerebrovascular disease: Secondary | ICD-10-CM

## 2015-05-18 DIAGNOSIS — Z96642 Presence of left artificial hip joint: Secondary | ICD-10-CM | POA: Diagnosis present

## 2015-05-18 DIAGNOSIS — R778 Other specified abnormalities of plasma proteins: Secondary | ICD-10-CM | POA: Diagnosis present

## 2015-05-18 DIAGNOSIS — F1410 Cocaine abuse, uncomplicated: Secondary | ICD-10-CM

## 2015-05-18 DIAGNOSIS — Z96653 Presence of artificial knee joint, bilateral: Secondary | ICD-10-CM | POA: Diagnosis present

## 2015-05-18 DIAGNOSIS — I69922 Dysarthria following unspecified cerebrovascular disease: Secondary | ICD-10-CM | POA: Diagnosis not present

## 2015-05-18 DIAGNOSIS — R0789 Other chest pain: Secondary | ICD-10-CM | POA: Diagnosis not present

## 2015-05-18 LAB — RAPID URINE DRUG SCREEN, HOSP PERFORMED
Amphetamines: NOT DETECTED
Barbiturates: NOT DETECTED
Benzodiazepines: NOT DETECTED
COCAINE: NOT DETECTED
OPIATES: NOT DETECTED
Tetrahydrocannabinol: NOT DETECTED

## 2015-05-18 LAB — CBC WITH DIFFERENTIAL/PLATELET
BASOS PCT: 0 %
Basophils Absolute: 0 10*3/uL (ref 0.0–0.1)
EOS ABS: 0 10*3/uL (ref 0.0–0.7)
Eosinophils Relative: 0 %
HCT: 45 % (ref 36.0–46.0)
HEMOGLOBIN: 16.2 g/dL — AB (ref 12.0–15.0)
LYMPHS ABS: 2.6 10*3/uL (ref 0.7–4.0)
Lymphocytes Relative: 18 %
MCH: 31.8 pg (ref 26.0–34.0)
MCHC: 36 g/dL (ref 30.0–36.0)
MCV: 88.4 fL (ref 78.0–100.0)
Monocytes Absolute: 1.3 10*3/uL — ABNORMAL HIGH (ref 0.1–1.0)
Monocytes Relative: 9 %
NEUTROS PCT: 73 %
Neutro Abs: 10.2 10*3/uL — ABNORMAL HIGH (ref 1.7–7.7)
Platelets: 284 10*3/uL (ref 150–400)
RBC: 5.09 MIL/uL (ref 3.87–5.11)
RDW: 13.2 % (ref 11.5–15.5)
WBC: 14.2 10*3/uL — AB (ref 4.0–10.5)

## 2015-05-18 LAB — COMPREHENSIVE METABOLIC PANEL
ALT: 18 U/L (ref 14–54)
AST: 25 U/L (ref 15–41)
Albumin: 3.8 g/dL (ref 3.5–5.0)
Alkaline Phosphatase: 73 U/L (ref 38–126)
Anion gap: 9 (ref 5–15)
BUN: 8 mg/dL (ref 6–20)
CHLORIDE: 107 mmol/L (ref 101–111)
CO2: 22 mmol/L (ref 22–32)
CREATININE: 0.81 mg/dL (ref 0.44–1.00)
Calcium: 9.9 mg/dL (ref 8.9–10.3)
Glucose, Bld: 190 mg/dL — ABNORMAL HIGH (ref 65–99)
Potassium: 3.4 mmol/L — ABNORMAL LOW (ref 3.5–5.1)
Sodium: 138 mmol/L (ref 135–145)
Total Bilirubin: 0.4 mg/dL (ref 0.3–1.2)
Total Protein: 6.9 g/dL (ref 6.5–8.1)

## 2015-05-18 LAB — URINE MICROSCOPIC-ADD ON

## 2015-05-18 LAB — URINALYSIS, ROUTINE W REFLEX MICROSCOPIC
Bilirubin Urine: NEGATIVE
Glucose, UA: >1000 mg/dL — AB
Hgb urine dipstick: NEGATIVE
KETONES UR: NEGATIVE mg/dL
LEUKOCYTES UA: NEGATIVE
NITRITE: NEGATIVE
PROTEIN: NEGATIVE mg/dL
Specific Gravity, Urine: 1.013 (ref 1.005–1.030)
UROBILINOGEN UA: 0.2 mg/dL (ref 0.0–1.0)
pH: 6 (ref 5.0–8.0)

## 2015-05-18 LAB — ETHANOL: Alcohol, Ethyl (B): <5 mg/dL (ref <5)

## 2015-05-18 LAB — TROPONIN I
TROPONIN I: 0.04 ng/mL — AB (ref <0.031)
TROPONIN I: 0.11 ng/mL — AB (ref <0.031)

## 2015-05-18 MED ORDER — NICOTINE 14 MG/24HR TD PT24
14.0000 mg | MEDICATED_PATCH | Freq: Every day | TRANSDERMAL | Status: DC
  Administered 2015-05-18 – 2015-05-20 (×3): 14 mg via TRANSDERMAL
  Filled 2015-05-18 (×3): qty 1

## 2015-05-18 MED ORDER — CYCLOBENZAPRINE HCL 10 MG PO TABS: 10.0000 mg | ORAL_TABLET | Freq: Three times a day (TID) | ORAL | Status: DC | PRN

## 2015-05-18 MED ORDER — VITAMIN B-1 100 MG PO TABS
100.0000 mg | ORAL_TABLET | Freq: Every day | ORAL | Status: DC
  Administered 2015-05-18 – 2015-05-20 (×3): 100 mg via ORAL
  Filled 2015-05-18 (×3): qty 1

## 2015-05-18 MED ORDER — NITROGLYCERIN 2 % TD OINT
1.0000 [in_us] | TOPICAL_OINTMENT | Freq: Four times a day (QID) | TRANSDERMAL | Status: DC
  Administered 2015-05-18 – 2015-05-19 (×6): 1 [in_us] via TOPICAL
  Filled 2015-05-18 (×3): qty 30

## 2015-05-18 MED ORDER — DEXAMETHASONE 4 MG PO TABS
4.0000 mg | ORAL_TABLET | Freq: Every day | ORAL | Status: DC
  Administered 2015-05-18 – 2015-05-19 (×2): 4 mg via ORAL
  Filled 2015-05-18 (×2): qty 1

## 2015-05-18 MED ORDER — DIPHENHYDRAMINE HCL 50 MG/ML IJ SOLN
25.0000 mg | Freq: Once | INTRAMUSCULAR | Status: AC
  Administered 2015-05-18: 25 mg via INTRAVENOUS
  Filled 2015-05-18: qty 1

## 2015-05-18 MED ORDER — METOCLOPRAMIDE HCL 5 MG/ML IJ SOLN
10.0000 mg | Freq: Once | INTRAMUSCULAR | Status: AC
  Administered 2015-05-18: 10 mg via INTRAVENOUS
  Filled 2015-05-18: qty 2

## 2015-05-18 MED ORDER — POTASSIUM CHLORIDE CRYS ER 20 MEQ PO TBCR
40.0000 meq | EXTENDED_RELEASE_TABLET | Freq: Once | ORAL | Status: AC
  Administered 2015-05-18: 40 meq via ORAL
  Filled 2015-05-18: qty 2

## 2015-05-18 MED ORDER — FOLIC ACID 1 MG PO TABS
1.0000 mg | ORAL_TABLET | Freq: Every day | ORAL | Status: DC
  Administered 2015-05-18 – 2015-05-20 (×3): 1 mg via ORAL
  Filled 2015-05-18 (×3): qty 1

## 2015-05-18 MED ORDER — ASPIRIN 325 MG PO TABS
325.0000 mg | ORAL_TABLET | Freq: Every day | ORAL | Status: DC
  Administered 2015-05-18 – 2015-05-20 (×3): 325 mg via ORAL
  Filled 2015-05-18 (×3): qty 1

## 2015-05-18 MED ORDER — TRAMADOL HCL 50 MG PO TABS
50.0000 mg | ORAL_TABLET | Freq: Four times a day (QID) | ORAL | Status: DC | PRN
  Administered 2015-05-18 – 2015-05-19 (×2): 50 mg via ORAL
  Filled 2015-05-18 (×2): qty 1

## 2015-05-18 MED ORDER — HYDRALAZINE HCL 50 MG PO TABS
50.0000 mg | ORAL_TABLET | Freq: Three times a day (TID) | ORAL | Status: DC
  Administered 2015-05-18 – 2015-05-20 (×7): 50 mg via ORAL
  Filled 2015-05-18 (×7): qty 1

## 2015-05-18 MED ORDER — ATORVASTATIN CALCIUM 40 MG PO TABS
40.0000 mg | ORAL_TABLET | Freq: Every day | ORAL | Status: DC
  Administered 2015-05-18 – 2015-05-19 (×2): 40 mg via ORAL
  Filled 2015-05-18 (×2): qty 1

## 2015-05-18 MED ORDER — DIGOXIN 125 MCG PO TABS
250.0000 ug | ORAL_TABLET | Freq: Every day | ORAL | Status: DC
  Administered 2015-05-18 – 2015-05-19 (×2): 250 ug via ORAL
  Filled 2015-05-18 (×2): qty 2

## 2015-05-18 MED ORDER — PANTOPRAZOLE SODIUM 40 MG IV SOLR
40.0000 mg | Freq: Two times a day (BID) | INTRAVENOUS | Status: DC
  Administered 2015-05-18 – 2015-05-20 (×5): 40 mg via INTRAVENOUS
  Filled 2015-05-18 (×5): qty 40

## 2015-05-18 MED ORDER — ACETAMINOPHEN 325 MG PO TABS
650.0000 mg | ORAL_TABLET | Freq: Four times a day (QID) | ORAL | Status: DC | PRN
  Administered 2015-05-19: 650 mg via ORAL
  Filled 2015-05-18: qty 2

## 2015-05-18 MED ORDER — ENOXAPARIN SODIUM 40 MG/0.4ML ~~LOC~~ SOLN
40.0000 mg | SUBCUTANEOUS | Status: DC
  Administered 2015-05-18 – 2015-05-19 (×2): 40 mg via SUBCUTANEOUS
  Filled 2015-05-18 (×2): qty 0.4

## 2015-05-18 NOTE — Progress Notes (Signed)
ELECTROPHYSIOLOGY CONSULT NOTE  Patient ID: [name], MRN: [id number], DOB/AGE: Apr 16, 1956 59 y.o. Admit date: 05/17/2015 Date of Consult: 05/18/2015  Primary Physician: Evlyn Courier, MD Primary Cardiologist: new  Chief Complaint: chest pain   HPI [name] is a 59 y.o. female  Seen at the request of the hospitalist service because of positive troponin.  She returned to the hospital following the below mentioned stroke with complaints of chest pain described as pressure. She does not recall the symptoms associated with her "prior heart attack" which occurred 5-10 years ago. She denies recent exertional chest discomfort.  She has had dyspnea on exertion and nocturnal dyspnea with some asymmetric edema  She has a history of polysubstance abuse and was admitted just a few days ago with a stroke involving her right body/left acute/subacute ACA infarct. It was felt to be embolic and she underwent TEE demonstrated no vegetations (there is a question about endocarditis for reasons that I cannot clearly appreciate) and no left atrial clot.  Transthoracic echo demonstrated mild concentric LVH with normal LV function without significant valvular abnormalities.      She is noncompliant with antiplatelet therapy and antihypertensive therapy.      Past Medical History  Diagnosis Date  . Hypertension   . Complication of anesthesia     Heart attack during surgery  . Myocardial infarction (HCC)   . Heart murmur   . Shortness of breath   . Pneumonia     hx of pna  . Dysrhythmia     atrial fibrilation  . GERD (gastroesophageal reflux disease)   . Arthritis       Surgical History:  Past Surgical History  Procedure Laterality Date  . Total hip arthroplasty  2010  . Replacement total knee bilateral    . Shoulder arthroscopy w/ rotator cuff repair    . Skin graft    . Abdominal hysterectomy    . Hip surgery  12/09/2011    revision  . Total hip revision  12/09/2011   Procedure: TOTAL HIP REVISION;  Surgeon: Kennieth Rad, MD;  Location: [facility];  Service: Orthopedics;  Laterality: Left;     Home Meds: Prior to Admission medications   Medication Sig Start Date End Date Taking? Authorizing Provider  aspirin 325 MG tablet Take 1 tablet (325 mg total) by mouth daily. 05/17/15  Yes Leroy Sea, MD  atorvastatin (LIPITOR) 40 MG tablet Take 1 tablet (40 mg total) by mouth daily at 6 PM. 05/17/15  Yes Leroy Sea, MD  cyclobenzaprine (FLEXERIL) 10 MG tablet Take 10 mg by mouth 3 (three) times daily as needed for muscle spasms.    Yes Historical Provider, MD  dexamethasone (DECADRON) 4 MG tablet Take 4 mg by mouth daily.   Yes Historical Provider, MD  digoxin (LANOXIN) 0.25 MG tablet Take 250 mcg by mouth daily.   Yes Historical Provider, MD  folic acid (FOLVITE) 1 MG tablet Take 1 tablet (1 mg total) by mouth daily. 05/17/15  Yes Leroy Sea, MD  hydrALAZINE (APRESOLINE) 100 MG tablet Take 0.5 tablets (50 mg total) by mouth 3 (three) times daily. 05/17/15  Yes Leroy Sea, MD  thiamine 100 MG tablet Take 1 tablet (100 mg total) by mouth daily. 05/17/15  Yes Leroy Sea, MD  nicotine (NICODERM CQ - DOSED IN MG/24 HOURS) 14 mg/24hr patch Place 1 patch (14 mg total) onto the skin daily. Patient not taking: Reported on 05/18/2015 05/17/15   Bess Harvest K  Thedore Mins, MD    Inpatient Medications:  . aspirin  325 mg Oral Daily  . atorvastatin  40 mg Oral q1800  . dexamethasone  4 mg Oral Daily  . digoxin  250 mcg Oral Daily  . enoxaparin (LOVENOX) injection  40 mg Subcutaneous Q24H  . folic acid  1 mg Oral Daily  . hydrALAZINE  50 mg Oral TID  . nicotine  14 mg Transdermal Daily  . nitroGLYCERIN  1 inch Topical 4 times per day  . pantoprazole (PROTONIX) IV  40 mg Intravenous Q12H  . potassium chloride  40 mEq Oral Once  . thiamine  100 mg Oral Daily     Allergies:  Allergies  Allergen Reactions  . Penicillins Itching    Social History    Social History  . Marital Status: Widowed    Spouse Name: N/A  . Number of Children: N/A  . Years of Education: N/A   Occupational History  . Not on file.   Social History Main Topics  . Smoking status: Current Some Day Smoker  . Smokeless tobacco: Never Used  . Alcohol Use: Yes     Comment: occ beer  . Drug Use: Yes    Special: Cocaine, Marijuana  . Sexual Activity: Not Currently    Birth Control/ Protection: Post-menopausal     Comment: Smokes sometimes 3 a day sometimes non   Other Topics Concern  . Not on file   Social History Narrative     Family History  Problem Relation Age of Onset  . Anesthesia problems Neg Hx      ROS:  Please see the history of present illness.     All other systems reviewed and negative.    Physical Exam:   Blood pressure 166/72, pulse 95, temperature 98.3 F (36.8 C), temperature source Oral, resp. rate 18, height 5\' 5"  (1.651 m), weight 135 lb 14.4 oz (61.644 kg), SpO2 97 %. General: Well developed, well nourished female in no acute distress. Head: Normocephalic, atraumatic, sclera non-icteric, no xanthomas, nares are without discharge. EENT: normal Lymph Nodes:  none Back: without scoliosis/kyphosis , no CVA tendersness Neck: Likely transmitted murmurs bilaterally versus carotid bruits. JVD not elevated. Lungs: Clear bilaterally to auscultation without wheezes, rales, or rhonchi. Breathing is unlabored. Heart: RRR with S1 S2.  3/6 systolic  murmur , rubs, or gallops appreciated. Abdomen: Soft, non-tender, non-distended with normoactive bowel sounds. No hepatomegaly. No rebound/guarding. No obvious abdominal masses. Msk:  Strength and tone appear normal for age. Extremities: No clubbing or cyanosis. No dema.  Distal pedal pulses are 2+ and equal bilaterally. Skin: Warm and Dry Neuro: Alert and oriented X 3. CN III-XII intact modest  Responds to questions appropriately with a normal affect.      Labs: Cardiac Enzymes  Recent  Labs  05/15/15 2221 05/16/15 0420 05/18/15 0133 05/18/15 0449  TROPONINI 0.04* 0.05* 0.04* 0.11*   CBC Lab Results  Component Value Date   WBC 14.2* 05/18/2015   HGB 16.2* 05/18/2015   HCT 45.0 05/18/2015   MCV 88.4 05/18/2015   PLT 284 05/18/2015   PROTIME: No results for input(s): LABPROT, INR in the last 72 hours. Chemistry  Recent Labs Lab 05/18/15 0133  NA 138  K 3.4*  CL 107  CO2 22  BUN 8  CREATININE 0.81  CALCIUM 9.9  PROT 6.9  BILITOT 0.4  ALKPHOS 73  ALT 18  AST 25  GLUCOSE 190*   Lipids Lab Results  Component Value Date  CHOL 211* 05/15/2015   HDL 60 05/15/2015   LDLCALC 134* 05/15/2015   TRIG 85 05/15/2015   BNP PRO B NATRIURETIC PEPTIDE (BNP)  Date/Time Value Ref Range Status  08/31/2007 09:00 PM 38.0  Final   Thyroid Function Tests: No results for input(s): TSH, T4TOTAL, T3FREE, THYROIDAB in the last 72 hours.  Invalid input(s): FREET3    Miscellaneous Lab Results  Component Value Date   DDIMER  10/01/2010    <0.22        AT THE INHOUSE ESTABLISHED CUTOFF VALUE OF 0.48 ug/mL FEU, THIS ASSAY HAS BEEN DOCUMENTED IN THE LITERATURE TO HAVE A SENSITIVITY AND NEGATIVE PREDICTIVE VALUE OF AT LEAST 98 TO 99%.  THE TEST RESULT SHOULD BE CORRELATED WITH AN ASSESSMENT OF THE CLINICAL PROBABILITY OF DVT / VTE.    Radiology/Studies:  Dg Chest 2 View  05/15/2015  CLINICAL DATA:  59 year old female with a history of stroke, weakness EXAM: CHEST - 2 VIEW COMPARISON:  08/12/2014 FINDINGS: Cardiomediastinal silhouette unchanged. Atherosclerotic calcifications of the aortic arch. Stigmata of emphysema, with increased retrosternal airspace, flattened hemidiaphragms, increased AP diameter, and hyperinflation on the AP view. Similar appearance of interstitial opacities with no confluent airspace disease. No pleural effusion or pneumothorax. No interlobular septal thickening. Degenerative changes of the spine. Unremarkable appearance of the upper  abdomen. IMPRESSION: Emphysema and chronic lung scarring with no definite evidence of superimposed acute cardiopulmonary disease. Signed, Yvone Neu. Loreta Ave, DO [facility] Radiology Electronically Signed   By: Gilmer Mor D.O.   On: 05/15/2015 06:59   Ct Head Wo Contrast  05/18/2015  CLINICAL DATA:  Headache and weakness. EXAM: CT HEAD WITHOUT CONTRAST TECHNIQUE: Contiguous axial images were obtained from the base of the skull through the vertex without intravenous contrast. COMPARISON:  Head CT 4 days ago.  Brain MRI from 3 days ago FINDINGS: Skull and Sinuses:Negative for fracture or destructive process. The mastoids, middle ears, and imaged paranasal sinuses are clear. Orbits: No acute abnormality. Brain: Subacute infarct in the left ACA territory, from the left genu of the corpus callosum to the surface of the parasagittal left frontal lobe. No hemorrhagic conversion or evidence of extension. Pre-existing perforator infarct in the left caudate head and putamen and previous small infarct in the parasagittal left occipital lobe. No new infarct detected. No hydrocephalus. No shift. IMPRESSION: 1. No acute finding. 2. Subacute left ACA territory infarcts without evidence of progression or hemorrhagic conversion. 3. Remote left basal ganglia and occipital cortex infarcts. Electronically Signed   By: Marnee Spring M.D.   On: 05/18/2015 01:05   Ct Head Wo Contrast  05/14/2015  CLINICAL DATA:  2-5 day history of headache, blurry vision, slurred speech and right hand and leg pain. EXAM: CT HEAD WITHOUT CONTRAST TECHNIQUE: Contiguous axial images were obtained from the base of the skull through the vertex without intravenous contrast. COMPARISON:  05/24/2014. FINDINGS: There is an area of low attenuation in the central aspect of the left frontal lobe in the ACA territory consistent with a acute or subacute cortical infarct. No hemorrhage. No hemispheric infarction.  Remote lacunar type infarct noted in the left caudate area. The brainstem and cerebellum are normal. The bony structures are intact. The paranasal sinuses and mastoid air cells are clear. IMPRESSION: 1. Acute or subacute left ACA territory infarct.  No hemorrhage. 2. Remote lacunar type infarct in the left caudate area. These results were called by telephone at the time of interpretation on 05/14/2015 at 11:11 pm  to Dr. Azalia Bilis , who verbally acknowledged these results. Electronically Signed   By: Rudie Meyer M.D.   On: 05/14/2015 23:11   Mr Brain Wo Contrast  05/18/2015  CLINICAL DATA:  Initial evaluation for increased right-sided weakness and dizziness with blurry vision. EXAM: MRI HEAD WITHOUT CONTRAST TECHNIQUE: Multiplanar, multiecho pulse sequences of the brain and surrounding structures were obtained without intravenous contrast. COMPARISON:  Prior CT from earlier same day as well as previous MRI from 05/15/2015. FINDINGS: Again seen is patchy and confluent restricted diffusion involving the left ACA territory, consistent with acute/subacute ischemic infarct. Overall, distribution and size of this infarct is not significantly changed relative to recent MRI. Again, probable faint petechial hemorrhage without evidence for hemorrhagic transformation. No significant mass effect. No new areas of infarction. Major intracranial vascular flow voids are maintained. Previously identified left A2 and PCA occlusions not well seen on this exam. Age-related cerebral atrophy with mild chronic small vessel ischemic disease noted, stable. Remote lacunar infarct within the left basal ganglia/ corona radiata. Small remote lacunar infarct within the left thalamus. Additional remote left occipital lobe infarct. No mass lesion, midline shift, or mass effect. No hydrocephalus. No extra-axial fluid collection. Craniocervical junction normal. Mild degenerative spondylolysis within the visualized upper cervical spine.  Pituitary gland normal.  No acute abnormality about the orbits. Paranasal sinuses and mastoid air cells are clear. Inner ear structures normal. Bone marrow signal intensity within normal limits. Scalp soft tissues unremarkable. IMPRESSION: 1. Normal expected interval evolution of acute/subacute left ACA territory infarct. No hemorrhagic transformation or other complication identified. 2. No other new or acute intracranial process. 3. Stable atrophy with chronic small vessel ischemic disease and remote infarcts as above. Electronically Signed   By: Rise Mu M.D.   On: 05/18/2015 02:58   Mr Brain Wo Contrast  05/15/2015  CLINICAL DATA:  Initial evaluation for right lower extremity weakness. EXAM: MRI HEAD WITHOUT CONTRAST MRA HEAD WITHOUT CONTRAST TECHNIQUE: Multiplanar, multiecho pulse sequences of the brain and surrounding structures were obtained without intravenous contrast. Angiographic images of the head were obtained using MRA technique without contrast. COMPARISON:  Prior CT from 05/14/2015. FINDINGS: MRI HEAD FINDINGS Patchy and confluent areas of restricted diffusion seen throughout the parasagittal left frontal lobe extending posteriorly towards the left parietal lobe, consistent with acute left ACA territory infarct. Largest confluent area of restricted diffusion measures 3.2 x 2.0 cm. There is involvement of the anterior left corpus callosum. Associated gyral swelling without significant mass effect. Faint petechial hemorrhage without hemorrhagic transformation. No right cerebral or infratentorial infarcts. Major intravascular flow voids are maintained. No acute or chronic intracranial hemorrhage. Age-related cerebral atrophy with mild chronic microvascular ischemic disease. Remote lacunar infarct within the left basal ganglia/corona radiata. Probable small additional remote lacunar infarct within the left thalamus. Remote cortical infarct within the parasagittal left occipital lobe. No  mass lesion, midline shift, or mass effect. No hydrocephalus. No extra-axial fluid collection. Craniocervical junction within normal limits. Mild degenerative spondylolysis within the upper cervical spine. Pituitary gland normal.  No acute abnormality about the orbits. Paranasal sinuses are clear. No mastoid effusion. Inner ear structures within normal limits. Bone marrow signal intensity normal. No scalp soft tissue abnormality. MRA HEAD FINDINGS ANTERIOR CIRCULATION: Visualized distal cervical segments of the internal carotid arteries are patent with antegrade flow. The petrous, cavernous, and supraclinoid segments are patent without high-grade stenosis. Left A1 segment patent. Right A1 segment hypoplastic. Anterior communicating artery normal. Atheromatous irregularity within the right A2 segment. There  is occlusion of the left A2 segment (series 6, image 97). M1 segments opacified without proximal arterial branch occlusion. There is focal moderate stenosis within the distal left M1 segment (series 802, image 14). Distal MCA branches are opacified bilaterally. POSTERIOR CIRCULATION: Left vertebral artery dominant and patent to the vertebrobasilar junction. Diminutive right vertebral artery patent as well. Left posterior inferior cerebral artery patent. Right PICA not visualized. Basilar artery widely patent. Left SCA opacified. Right SCA is faintly visualized on time-of-flight. Right P1 segment widely patent. There is a focal moderate to severe stenosis within the mid right P2 segment (series 803, image 18). Left P1 segment patent. There is apparent occlusion of the proximal left P2 segment. Patent left posterior communicating artery noted. No aneurysm. IMPRESSION: MRI HEAD IMPRESSION: 1. Acute left ACA territory infarct. There is faint petechial hemorrhage without hemorrhagic transformation or significant mass effect. 2. Remote left occipital cortical infarct, with additional remote lacunar infarcts involving  the left basal ganglia/corona radiata and left thalamus. 3. Mild chronic small vessel ischemic disease. MRA HEAD IMPRESSION: 1. Occlusion of the proximal left A2 segment. 2. Occlusion of the proximal left PCA, likely chronic given the chronic left occipital infarct. 3. Focal moderate to severe stenosis within the mid right P2 segment. 4. Focal moderate stenosis within the distal left M1 segment. 5. Multifocal atheromatous irregularity within the right ACA. Electronically Signed   By: Rise Mu M.D.   On: 05/15/2015 04:58   Mr Palma Holter  05/15/2015  CLINICAL DATA:  Initial evaluation for right lower extremity weakness. EXAM: MRI HEAD WITHOUT CONTRAST MRA HEAD WITHOUT CONTRAST TECHNIQUE: Multiplanar, multiecho pulse sequences of the brain and surrounding structures were obtained without intravenous contrast. Angiographic images of the head were obtained using MRA technique without contrast. COMPARISON:  Prior CT from 05/14/2015. FINDINGS: MRI HEAD FINDINGS Patchy and confluent areas of restricted diffusion seen throughout the parasagittal left frontal lobe extending posteriorly towards the left parietal lobe, consistent with acute left ACA territory infarct. Largest confluent area of restricted diffusion measures 3.2 x 2.0 cm. There is involvement of the anterior left corpus callosum. Associated gyral swelling without significant mass effect. Faint petechial hemorrhage without hemorrhagic transformation. No right cerebral or infratentorial infarcts. Major intravascular flow voids are maintained. No acute or chronic intracranial hemorrhage. Age-related cerebral atrophy with mild chronic microvascular ischemic disease. Remote lacunar infarct within the left basal ganglia/corona radiata. Probable small additional remote lacunar infarct within the left thalamus. Remote cortical infarct within the parasagittal left occipital lobe. No mass lesion, midline shift, or mass effect. No hydrocephalus. No  extra-axial fluid collection. Craniocervical junction within normal limits. Mild degenerative spondylolysis within the upper cervical spine. Pituitary gland normal.  No acute abnormality about the orbits. Paranasal sinuses are clear. No mastoid effusion. Inner ear structures within normal limits. Bone marrow signal intensity normal. No scalp soft tissue abnormality. MRA HEAD FINDINGS ANTERIOR CIRCULATION: Visualized distal cervical segments of the internal carotid arteries are patent with antegrade flow. The petrous, cavernous, and supraclinoid segments are patent without high-grade stenosis. Left A1 segment patent. Right A1 segment hypoplastic. Anterior communicating artery normal. Atheromatous irregularity within the right A2 segment. There is occlusion of the left A2 segment (series 6, image 97). M1 segments opacified without proximal arterial branch occlusion. There is focal moderate stenosis within the distal left M1 segment (series 802, image 14). Distal MCA branches are opacified bilaterally. POSTERIOR CIRCULATION: Left vertebral artery dominant and patent to the vertebrobasilar junction. Diminutive right vertebral artery patent as well. Left  posterior inferior cerebral artery patent. Right PICA not visualized. Basilar artery widely patent. Left SCA opacified. Right SCA is faintly visualized on time-of-flight. Right P1 segment widely patent. There is a focal moderate to severe stenosis within the mid right P2 segment (series 803, image 18). Left P1 segment patent. There is apparent occlusion of the proximal left P2 segment. Patent left posterior communicating artery noted. No aneurysm. IMPRESSION: MRI HEAD IMPRESSION: 1. Acute left ACA territory infarct. There is faint petechial hemorrhage without hemorrhagic transformation or significant mass effect. 2. Remote left occipital cortical infarct, with additional remote lacunar infarcts involving the left basal ganglia/corona radiata and left thalamus. 3. Mild  chronic small vessel ischemic disease. MRA HEAD IMPRESSION: 1. Occlusion of the proximal left A2 segment. 2. Occlusion of the proximal left PCA, likely chronic given the chronic left occipital infarct. 3. Focal moderate to severe stenosis within the mid right P2 segment. 4. Focal moderate stenosis within the distal left M1 segment. 5. Multifocal atheromatous irregularity within the right ACA. Electronically Signed   By: Rise MuBenjamin  McClintock M.D.   On: 05/15/2015 04:58    ZOX:[id number] 17/08/42 LVH with modest ST changes Not different from 11/9   Assessment and Plan:  +TN  Cocaine use  Stroke subacute  Is unclear as to whether her history of a prior MI is true or not; however, the troponin elevation occurs now relatively late in the last measured cocaine use. Hence, I think it is reasonable to undertake Myoview scanning for risk stratification realizing that compliance with therapy challenge. In this regard she is confident that she can on her own discontinue her to cocaine use.  I see no indication for digoxin so we will stop it.  In January nitroglycerin makes sense in the setting of chest pain.  Continue aspirin and atorvastatin for secondary prevention given her stroke     Sherryl MangesSteven Eyal Greenhaw

## 2015-05-18 NOTE — Progress Notes (Signed)
Pt arrived to floor.  Telemetry applied and CCMD notified.  Pt oriented to room.  Education provided on call light and telephone.  Education provided to Pt and family at bedside.  Pt blood pressure elevated on arrival, given nitrostat as ordered and repeat BP trending down.  Pt c/o headache upon arrival, but denied pain upon reassessment.  Pt has some right sided deficits d/t recent hospitalization for CVA.  Pt denies chest pain or sob on arrival to unit.  Will continue to monitor.

## 2015-05-18 NOTE — Progress Notes (Signed)
Patient Demographics  [name], is a 59 y.o. female, DOB - [birth year], WGN:[id number]  Admit date - 05/17/2015   Admitting Physician Melanie Parker, MD  Outpatient Primary MD for the patient is Melanie Courier, MD  LOS - 0   Chief Complaint  Patient presents with  . Chest Pain         Subjective:   [name] today has, No headache, has muscular chest wall pain on palpation, No abdominal pain - No Nausea, No new weakness tingling or numbness, No Cough - SOB.   Assessment & Plan    Principal Problem:   Elevated troponin I level Active Problems:   Tobacco abuse   Alcohol abuse   Cocaine abuse   Leukocytosis   Polycythemia   Late effects of CVA (cerebrovascular accident)   Essential hypertension  Chest pain with elevated troponins - Patient just pain has musculoskeletal future, has reproducible on palpation, patient reports [facility] has not been helping these are not typical features for chest pain, but given her elevated troponin she was admitted for further workup. - Troponins 0.04>>0.11, unclear etiology, possible recent acute CVA, cardiology consulted  Recent diagnosis of acute subacute CVA - Patient with residual deficits mild right-sided hemiparesis, mild dementia - Continue with aspirin and statin  Hypertension - Continue with hydralazine  Hyperlipidemia - Continue statin  Tobacco abuse - And nicotine patch  Cocaine /alcohol abuse - None since discharge  Leukocytosis - Chronic, afebrile, no evidence of infection, most likely due to chronic steroid use  Chronic Steroid Use - suspect on it for arthritis-per patient on decadron for 6 months-will defer taper to outpatient setting.  Code Status: Full  Family Communication: Multiple family members at bedside  Disposition Plan: Home in stable   Procedures  None   Consults    Cardiology   Medications  Scheduled Meds: . aspirin  325 mg Oral Daily  . atorvastatin  40 mg Oral q1800  . dexamethasone  4 mg Oral Daily  . digoxin  250 mcg Oral Daily  . folic acid  1 mg Oral Daily  . hydrALAZINE  50 mg Oral TID  . nicotine  14 mg Transdermal Daily  . nitroGLYCERIN  1 inch Topical 4 times per day  . pantoprazole (PROTONIX) IV  40 mg Intravenous Q12H  . thiamine  100 mg Oral Daily   Continuous Infusions:  PRN Meds:.acetaminophen, cyclobenzaprine, nitroGLYCERIN, traMADol  DVT Prophylaxis  Lovenox -  Lab Results  Component Value Date   PLT 284 05/18/2015    Antibiotics    Anti-infectives    None          Objective:   Filed Vitals:   05/18/15 0735 05/18/15 0834 05/18/15 0956 05/18/15 1150  BP: 178/106 189/80  166/72  Pulse: 62 77 72 95  Temp: 98.3 F (36.8 C)     TempSrc: Oral     Resp: 18     Height: 5\' 5"  (1.651 m)     Weight: 61.644 kg (135 lb 14.4 oz)     SpO2: 97%       Wt Readings from Last 3 Encounters:  05/18/15 61.644 kg (135 lb 14.4 oz)  05/15/15 61.326 kg (135 lb 3.2 oz)  05/23/14 70.761 kg (156  lb)    No intake or output data in the 24 hours ending 05/18/15 1324   Physical Exam  Awake Alert, Oriented X 3, [name].AT,PERRAL Supple Neck,No JVD, No cervical lymphadenopathy appriciated.  Symmetrical Chest wall movement, Good air movement bilaterally, with usable chest pain on palpation RRR,No Gallops,Rubs or new Murmurs, No Parasternal Heave +ve B.Sounds, Abd Soft, No tenderness, No organomegaly appriciated, No rebound - guarding or rigidity. No Cyanosis, Clubbing or edema, No new Rash or bruise    Data Review   Micro Results Recent Results (from the past 240 hour(s))  Culture, blood (routine x 2)     Status: None (Preliminary result)   Collection Time: 05/15/15 11:52 AM  Result Value Ref Range Status   Specimen Description BLOOD LEFT HAND  Final   Special Requests BOTTLES DRAWN AEROBIC AND ANAEROBIC 5CC  Final    Culture NO GROWTH 3 DAYS  Final   Report Status PENDING  Incomplete  Culture, blood (routine x 2)     Status: None (Preliminary result)   Collection Time: 05/15/15 12:00 PM  Result Value Ref Range Status   Specimen Description BLOOD RIGHT HAND  Final   Special Requests BOTTLES DRAWN AEROBIC AND ANAEROBIC 5CC  Final   Culture NO GROWTH 3 DAYS  Final   Report Status PENDING  Incomplete    Radiology Reports Dg Chest 2 View  05/15/2015  CLINICAL DATA:  59 year old female with a history of stroke, weakness EXAM: CHEST - 2 VIEW COMPARISON:  08/12/2014 FINDINGS: Cardiomediastinal silhouette unchanged. Atherosclerotic calcifications of the aortic arch. Stigmata of emphysema, with increased retrosternal airspace, flattened hemidiaphragms, increased AP diameter, and hyperinflation on the AP view. Similar appearance of interstitial opacities with no confluent airspace disease. No pleural effusion or pneumothorax. No interlobular septal thickening. Degenerative changes of the spine. Unremarkable appearance of the upper abdomen. IMPRESSION: Emphysema and chronic lung scarring with no definite evidence of superimposed acute cardiopulmonary disease. Signed, [name]. Melanie Ave, DO [facility] Radiology Electronically Signed   By: Gilmer Mor D.O.   On: 05/15/2015 06:59   Ct Head Wo Contrast  05/18/2015  CLINICAL DATA:  Headache and weakness. EXAM: CT HEAD WITHOUT CONTRAST TECHNIQUE: Contiguous axial images were obtained from the base of the skull through the vertex without intravenous contrast. COMPARISON:  Head CT 4 days ago.  Brain MRI from 3 days ago FINDINGS: Skull and Sinuses:Negative for fracture or destructive process. The mastoids, middle ears, and imaged paranasal sinuses are clear. Orbits: No acute abnormality. Brain: Subacute infarct in the left ACA territory, from the left genu of the corpus callosum to the surface of the parasagittal left frontal lobe.  No hemorrhagic conversion or evidence of extension. Pre-existing perforator infarct in the left caudate head and putamen and previous small infarct in the parasagittal left occipital lobe. No new infarct detected. No hydrocephalus. No shift. IMPRESSION: 1. No acute finding. 2. Subacute left ACA territory infarcts without evidence of progression or hemorrhagic conversion. 3. Remote left basal ganglia and occipital cortex infarcts. Electronically Signed   By: Marnee Spring M.D.   On: 05/18/2015 01:05   Ct Head Wo Contrast  05/14/2015  CLINICAL DATA:  2-5 day history of headache, blurry vision, slurred speech and right hand and leg pain. EXAM: CT HEAD WITHOUT CONTRAST TECHNIQUE: Contiguous axial images were obtained from the base of the skull through the vertex without intravenous contrast. COMPARISON:  05/24/2014. FINDINGS: There is an area of low attenuation in the central aspect of  the left frontal lobe in the ACA territory consistent with a acute or subacute cortical infarct. No hemorrhage. No hemispheric infarction. Remote lacunar type infarct noted in the left caudate area. The brainstem and cerebellum are normal. The bony structures are intact. The paranasal sinuses and mastoid air cells are clear. IMPRESSION: 1. Acute or subacute left ACA territory infarct.  No hemorrhage. 2. Remote lacunar type infarct in the left caudate area. These results were called by telephone at the time of interpretation on 05/14/2015 at 11:11 pm to Dr. Azalia Bilis , who verbally acknowledged these results. Electronically Signed   By: Rudie Meyer M.D.   On: 05/14/2015 23:11   Mr Brain Wo Contrast  05/18/2015  CLINICAL DATA:  Initial evaluation for increased right-sided weakness and dizziness with blurry vision. EXAM: MRI HEAD WITHOUT CONTRAST TECHNIQUE: Multiplanar, multiecho pulse sequences of the brain and surrounding structures were obtained without intravenous contrast. COMPARISON:  Prior CT from earlier same day as well  as previous MRI from 05/15/2015. FINDINGS: Again seen is patchy and confluent restricted diffusion involving the left ACA territory, consistent with acute/subacute ischemic infarct. Overall, distribution and size of this infarct is not significantly changed relative to recent MRI. Again, probable faint petechial hemorrhage without evidence for hemorrhagic transformation. No significant mass effect. No new areas of infarction. Major intracranial vascular flow voids are maintained. Previously identified left A2 and PCA occlusions not well seen on this exam. Age-related cerebral atrophy with mild chronic small vessel ischemic disease noted, stable. Remote lacunar infarct within the left basal ganglia/ corona radiata. Small remote lacunar infarct within the left thalamus. Additional remote left occipital lobe infarct. No mass lesion, midline shift, or mass effect. No hydrocephalus. No extra-axial fluid collection. Craniocervical junction normal. Mild degenerative spondylolysis within the visualized upper cervical spine. Pituitary gland normal.  No acute abnormality about the orbits. Paranasal sinuses and mastoid air cells are clear. Inner ear structures normal. Bone marrow signal intensity within normal limits. Scalp soft tissues unremarkable. IMPRESSION: 1. Normal expected interval evolution of acute/subacute left ACA territory infarct. No hemorrhagic transformation or other complication identified. 2. No other new or acute intracranial process. 3. Stable atrophy with chronic small vessel ischemic disease and remote infarcts as above. Electronically Signed   By: Rise Mu M.D.   On: 05/18/2015 02:58   Mr Brain Wo Contrast  05/15/2015  CLINICAL DATA:  Initial evaluation for right lower extremity weakness. EXAM: MRI HEAD WITHOUT CONTRAST MRA HEAD WITHOUT CONTRAST TECHNIQUE: Multiplanar, multiecho pulse sequences of the brain and surrounding structures were obtained without intravenous contrast.  Angiographic images of the head were obtained using MRA technique without contrast. COMPARISON:  Prior CT from 05/14/2015. FINDINGS: MRI HEAD FINDINGS Patchy and confluent areas of restricted diffusion seen throughout the parasagittal left frontal lobe extending posteriorly towards the left parietal lobe, consistent with acute left ACA territory infarct. Largest confluent area of restricted diffusion measures 3.2 x 2.0 cm. There is involvement of the anterior left corpus callosum. Associated gyral swelling without significant mass effect. Faint petechial hemorrhage without hemorrhagic transformation. No right cerebral or infratentorial infarcts. Major intravascular flow voids are maintained. No acute or chronic intracranial hemorrhage. Age-related cerebral atrophy with mild chronic microvascular ischemic disease. Remote lacunar infarct within the left basal ganglia/corona radiata. Probable small additional remote lacunar infarct within the left thalamus. Remote cortical infarct within the parasagittal left occipital lobe. No mass lesion, midline shift, or mass effect. No hydrocephalus. No extra-axial fluid collection. Craniocervical junction within normal limits. Mild degenerative  spondylolysis within the upper cervical spine. Pituitary gland normal.  No acute abnormality about the orbits. Paranasal sinuses are clear. No mastoid effusion. Inner ear structures within normal limits. Bone marrow signal intensity normal. No scalp soft tissue abnormality. MRA HEAD FINDINGS ANTERIOR CIRCULATION: Visualized distal cervical segments of the internal carotid arteries are patent with antegrade flow. The petrous, cavernous, and supraclinoid segments are patent without high-grade stenosis. Left A1 segment patent. Right A1 segment hypoplastic. Anterior communicating artery normal. Atheromatous irregularity within the right A2 segment. There is occlusion of the left A2 segment (series 6, image 97). M1 segments opacified without  proximal arterial branch occlusion. There is focal moderate stenosis within the distal left M1 segment (series 802, image 14). Distal MCA branches are opacified bilaterally. POSTERIOR CIRCULATION: Left vertebral artery dominant and patent to the vertebrobasilar junction. Diminutive right vertebral artery patent as well. Left posterior inferior cerebral artery patent. Right PICA not visualized. Basilar artery widely patent. Left SCA opacified. Right SCA is faintly visualized on time-of-flight. Right P1 segment widely patent. There is a focal moderate to severe stenosis within the mid right P2 segment (series 803, image 18). Left P1 segment patent. There is apparent occlusion of the proximal left P2 segment. Patent left posterior communicating artery noted. No aneurysm. IMPRESSION: MRI HEAD IMPRESSION: 1. Acute left ACA territory infarct. There is faint petechial hemorrhage without hemorrhagic transformation or significant mass effect. 2. Remote left occipital cortical infarct, with additional remote lacunar infarcts involving the left basal ganglia/corona radiata and left thalamus. 3. Mild chronic small vessel ischemic disease. MRA HEAD IMPRESSION: 1. Occlusion of the proximal left A2 segment. 2. Occlusion of the proximal left PCA, likely chronic given the chronic left occipital infarct. 3. Focal moderate to severe stenosis within the mid right P2 segment. 4. Focal moderate stenosis within the distal left M1 segment. 5. Multifocal atheromatous irregularity within the right ACA. Electronically Signed   By: Rise MuBenjamin  McClintock M.D.   On: 05/15/2015 04:58   Mr Palma HolterMra Headm  05/15/2015  CLINICAL DATA:  Initial evaluation for right lower extremity weakness. EXAM: MRI HEAD WITHOUT CONTRAST MRA HEAD WITHOUT CONTRAST TECHNIQUE: Multiplanar, multiecho pulse sequences of the brain and surrounding structures were obtained without intravenous contrast. Angiographic images of the head were obtained using MRA technique without  contrast. COMPARISON:  Prior CT from 05/14/2015. FINDINGS: MRI HEAD FINDINGS Patchy and confluent areas of restricted diffusion seen throughout the parasagittal left frontal lobe extending posteriorly towards the left parietal lobe, consistent with acute left ACA territory infarct. Largest confluent area of restricted diffusion measures 3.2 x 2.0 cm. There is involvement of the anterior left corpus callosum. Associated gyral swelling without significant mass effect. Faint petechial hemorrhage without hemorrhagic transformation. No right cerebral or infratentorial infarcts. Major intravascular flow voids are maintained. No acute or chronic intracranial hemorrhage. Age-related cerebral atrophy with mild chronic microvascular ischemic disease. Remote lacunar infarct within the left basal ganglia/corona radiata. Probable small additional remote lacunar infarct within the left thalamus. Remote cortical infarct within the parasagittal left occipital lobe. No mass lesion, midline shift, or mass effect. No hydrocephalus. No extra-axial fluid collection. Craniocervical junction within normal limits. Mild degenerative spondylolysis within the upper cervical spine. Pituitary gland normal.  No acute abnormality about the orbits. Paranasal sinuses are clear. No mastoid effusion. Inner ear structures within normal limits. Bone marrow signal intensity normal. No scalp soft tissue abnormality. MRA HEAD FINDINGS ANTERIOR CIRCULATION: Visualized distal cervical segments of the internal carotid arteries are patent with antegrade flow. The petrous,  cavernous, and supraclinoid segments are patent without high-grade stenosis. Left A1 segment patent. Right A1 segment hypoplastic. Anterior communicating artery normal. Atheromatous irregularity within the right A2 segment. There is occlusion of the left A2 segment (series 6, image 97). M1 segments opacified without proximal arterial branch occlusion. There is focal moderate stenosis within  the distal left M1 segment (series 802, image 14). Distal MCA branches are opacified bilaterally. POSTERIOR CIRCULATION: Left vertebral artery dominant and patent to the vertebrobasilar junction. Diminutive right vertebral artery patent as well. Left posterior inferior cerebral artery patent. Right PICA not visualized. Basilar artery widely patent. Left SCA opacified. Right SCA is faintly visualized on time-of-flight. Right P1 segment widely patent. There is a focal moderate to severe stenosis within the mid right P2 segment (series 803, image 18). Left P1 segment patent. There is apparent occlusion of the proximal left P2 segment. Patent left posterior communicating artery noted. No aneurysm. IMPRESSION: MRI HEAD IMPRESSION: 1. Acute left ACA territory infarct. There is faint petechial hemorrhage without hemorrhagic transformation or significant mass effect. 2. Remote left occipital cortical infarct, with additional remote lacunar infarcts involving the left basal ganglia/corona radiata and left thalamus. 3. Mild chronic small vessel ischemic disease. MRA HEAD IMPRESSION: 1. Occlusion of the proximal left A2 segment. 2. Occlusion of the proximal left PCA, likely chronic given the chronic left occipital infarct. 3. Focal moderate to severe stenosis within the mid right P2 segment. 4. Focal moderate stenosis within the distal left M1 segment. 5. Multifocal atheromatous irregularity within the right ACA. Electronically Signed   By: Rise Mu M.D.   On: 05/15/2015 04:58     CBC  Recent Labs Lab 05/14/15 2200 05/14/15 2219 05/16/15 0415 05/17/15 0603 05/18/15 0133  WBC 11.0*  --  14.1* 13.2* 14.2*  HGB 17.6* 17.7* 16.3* 14.6 16.2*  HCT 48.3* 52.0* 45.7 42.0 45.0  PLT 276  --  290 268 284  MCV 87.3  --  87.9 89.9 88.4  MCH 31.8  --  31.3 31.3 31.8  MCHC 36.4*  --  35.7 34.8 36.0  RDW 13.2  --  13.3 13.6 13.2  LYMPHSABS 3.3  --   --   --  2.6  MONOABS 1.1*  --   --   --  1.3*  EOSABS 0.1   --   --   --  0.0  BASOSABS 0.0  --   --   --  0.0    Chemistries   Recent Labs Lab 05/14/15 2200 05/14/15 2219 05/16/15 0415 05/18/15 0133  NA 136 140 138 138  K 3.6 3.5 3.8 3.4*  CL 101 102 105 107  CO2 24  --  23 22  GLUCOSE 115* 115* 196* 190*  BUN CREATININE 1.05* 1.00 0.93 0.81  CALCIUM 9.6  --  9.6 9.9  MG  --   --  2.2  --   AST 33  --   --  25  ALT 19  --   --  18  ALKPHOS 90  --   --  73  BILITOT 1.0  --   --  0.4   ------------------------------------------------------------------------------------------------------------------ estimated creatinine clearance is 65.6 mL/min (by C-G formula based on Cr of 0.81). ------------------------------------------------------------------------------------------------------------------ No results for input(s): HGBA1C in the last 72 hours. ------------------------------------------------------------------------------------------------------------------ No results for input(s): CHOL, HDL, LDLCALC, TRIG, CHOLHDL, LDLDIRECT in the last 72 hours. ------------------------------------------------------------------------------------------------------------------ No results for input(s): TSH, T4TOTAL, T3FREE, THYROIDAB in the last 72 hours.  Invalid input(s): FREET3 ------------------------------------------------------------------------------------------------------------------  No results for input(s): VITAMINB12, FOLATE, FERRITIN, TIBC, IRON, RETICCTPCT in the last 72 hours.  Coagulation profile  Recent Labs Lab 05/14/15 2200  INR 1.01    No results for input(s): DDIMER in the last 72 hours.  Cardiac Enzymes  Recent Labs Lab 05/16/15 0420 05/18/15 0133 05/18/15 0449  TROPONINI 0.05* 0.04* 0.11*   ------------------------------------------------------------------------------------------------------------------ Invalid input(s): POCBNP     Time Spent in minutes   No charge   Randol Kern, Moriah Shawley M.D on  05/18/2015 at 1:24 PM  Between 7am to 7pm - Pager - [phone]  After 7pm go to [email or web address] - password [email or web address]  [facility]   Office  [phone]

## 2015-05-18 NOTE — ED Notes (Signed)
Pt reports that headache and chest pain have gone. Refused ordered doses of Reglan and Benadryl at this time.

## 2015-05-18 NOTE — H&P (Signed)
[facility] Admission History and Physical       [name] YQM:[id number] DOB: June 06, 1956 DOA: 05/17/2015  Referring physician: EDP PCP: Evlyn Courier, MD  Specialists:   Chief Complaint: Chest Pain  HPI: [name] is a 59 y.o. female with history of Polysubstance Abuse,HTN, Atrial fibrillation, and a Recent CVA with Right Hemiparesis and Dysarthria discharged from the Hospital yesterday 05/17/2015 and returns to the ED with complaints of 7/10 substernal Chest Pain that lasted for about 2.5 hours, and resolved after taking 1 Aspirin and 2 SL NTG given by EMS.   She denies any SOB or nausea or Diaphoresis.  Her pain was described as a tightness in her chest.  Her EKG revealed no acute findings, however the initial Troponin was 0.04 and then next had increased to 0.11.   Cardiology was consulted to see patient and she was referred for admission.         Review of Systems:   Past Medical History  Diagnosis Date  . Hypertension   . Complication of anesthesia     Heart attack during surgery  . Myocardial infarction (HCC)   . Heart murmur   . Shortness of breath   . Pneumonia     hx of pna  . Dysrhythmia     atrial fibrilation  . GERD (gastroesophageal reflux disease)   . Arthritis      Past Surgical History  Procedure Laterality Date  . Total hip arthroplasty  2010  . Replacement total knee bilateral    . Shoulder arthroscopy w/ rotator cuff repair    . Skin graft    . Abdominal hysterectomy    . Hip surgery  12/09/2011    revision  . Total hip revision  12/09/2011    Procedure: TOTAL HIP REVISION;  Surgeon: Kennieth Rad, MD;  Location: [facility] OR;  Service: Orthopedics;  Laterality: Left;      Prior to Admission medications   Medication Sig Start Date End Date Taking? Authorizing Provider  aspirin 325 MG tablet Take 1 tablet (325 mg total) by mouth daily. 05/17/15  Yes Leroy Sea, MD  atorvastatin (LIPITOR) 40 MG tablet Take 1 tablet (40 mg  total) by mouth daily at 6 PM. 05/17/15  Yes Leroy Sea, MD  cyclobenzaprine (FLEXERIL) 10 MG tablet Take 10 mg by mouth 3 (three) times daily as needed for muscle spasms.    Yes Historical Provider, MD  dexamethasone (DECADRON) 4 MG tablet Take 4 mg by mouth daily.   Yes Historical Provider, MD  digoxin (LANOXIN) 0.25 MG tablet Take 250 mcg by mouth daily.   Yes Historical Provider, MD  folic acid (FOLVITE) 1 MG tablet Take 1 tablet (1 mg total) by mouth daily. 05/17/15  Yes Leroy Sea, MD  hydrALAZINE (APRESOLINE) 100 MG tablet Take 0.5 tablets (50 mg total) by mouth 3 (three) times daily. 05/17/15  Yes Leroy Sea, MD  thiamine 100 MG tablet Take 1 tablet (100 mg total) by mouth daily. 05/17/15  Yes Leroy Sea, MD  nicotine (NICODERM CQ - DOSED IN MG/24 HOURS) 14 mg/24hr patch Place 1 patch (14 mg total) onto the skin daily. Patient not taking: Reported on 05/18/2015 05/17/15   Leroy Sea, MD     Allergies  Allergen Reactions  . Penicillins Itching    Social History:  reports that she has been smoking.  She has never used smokeless tobacco. She reports that she drinks alcohol. She reports that she uses illicit  drugs (Cocaine and Marijuana).    Family History  Problem Relation Age of Onset  . Anesthesia problems Neg Hx        Physical Exam:  GEN:  Pleasant Thin ill Appearing 59 y.o. African American female examined and in no acute distress; cooperative with exam Filed Vitals:   05/18/15 0345 05/18/15 0400 05/18/15 0415 05/18/15 0515  BP: 174/88 147/95 158/96 171/78  Pulse: 86 91 90 80  Temp:      TempSrc:      Resp: 20 19 18 16   SpO2: 97% 94% 94% 97%   Blood pressure 171/78, pulse 80, temperature 98.1 F (36.7 C), temperature source Oral, resp. rate 16, SpO2 97 %. PSYCH: She is alert and oriented x4; does not appear anxious does not appear depressed; affect is normal HEENT: Normocephalic and Atraumatic, Mucous membranes pink; PERRLA; EOM intact;  Fundi:  Benign;  No scleral icterus, Nares: Patent, Oropharynx: Clear, Edentulous or Fair Dentition,    Neck:  FROM, No Cervical Lymphadenopathy nor Thyromegaly or Carotid Bruit; No JVD; Breasts:: Not examined CHEST WALL: No tenderness CHEST: Normal respiration, clear to auscultation bilaterally HEART: Regular rate and rhythm; no murmurs rubs or gallops BACK: No kyphosis or scoliosis; No CVA tenderness ABDOMEN: Positive Bowel Sounds, Scaphoid, Soft Non-Tender, No Rebound or Guarding; No Masses, No Organomegaly Rectal Exam: Not done EXTREMITIES: No Cyanosis, Clubbing, or Edema; No Ulcerations. Genitalia: not examined PULSES: 2+ and symmetric SKIN: Normal hydration no rash or ulceration CNS:  Alert and Oriented x 4, Mild dysarthria, and Right sided WEakness Vascular: pulses palpable throughout    Labs on Admission:  Basic Metabolic Panel:  Recent Labs Lab 05/14/15 2200 05/14/15 2219 05/16/15 0415 05/18/15 0133  NA 136 140 138 138  K 3.6 3.5 3.8 3.4*  CL 101 102 105 107  CO2 24  --  23 22  GLUCOSE 115* 115* 196* 190*  BUN 9 11 10 8   CREATININE 1.05* 1.00 0.93 0.81  CALCIUM 9.6  --  9.6 9.9  MG  --   --  2.2  --    Liver Function Tests:  Recent Labs Lab 05/14/15 2200 05/18/15 0133  AST 33 25  ALT 19 18  ALKPHOS 90 73  BILITOT 1.0 0.4  PROT 7.6 6.9  ALBUMIN 3.9 3.8   No results for input(s): LIPASE, AMYLASE in the last 168 hours. No results for input(s): AMMONIA in the last 168 hours. CBC:  Recent Labs Lab 05/14/15 2200 05/14/15 2219 05/16/15 0415 05/17/15 0603 05/18/15 0133  WBC 11.0*  --  14.1* 13.2* 14.2*  NEUTROABS 6.5  --   --   --  10.2*  HGB 17.6* 17.7* 16.3* 14.6 16.2*  HCT 48.3* 52.0* 45.7 42.0 45.0  MCV 87.3  --  87.9 89.9 88.4  PLT 276  --  290 268 284   Cardiac Enzymes:  Recent Labs Lab 05/15/15 1527 05/15/15 2221 05/16/15 0420 05/18/15 0133 05/18/15 0449  TROPONINI <0.03 0.04* 0.05* 0.04* 0.11*    BNP (last 3 results) No results  for input(s): BNP in the last 8760 hours.  ProBNP (last 3 results) No results for input(s): PROBNP in the last 8760 hours.  CBG: No results for input(s): GLUCAP in the last 168 hours.  Radiological Exams on Admission: Ct Head Wo Contrast  05/18/2015  CLINICAL DATA:  Headache and weakness. EXAM: CT HEAD WITHOUT CONTRAST TECHNIQUE: Contiguous axial images were obtained from the base of the skull through the vertex without intravenous contrast. COMPARISON:  Head CT  4 days ago.  Brain MRI from 3 days ago FINDINGS: Skull and Sinuses:Negative for fracture or destructive process. The mastoids, middle ears, and imaged paranasal sinuses are clear. Orbits: No acute abnormality. Brain: Subacute infarct in the left ACA territory, from the left genu of the corpus callosum to the surface of the parasagittal left frontal lobe. No hemorrhagic conversion or evidence of extension. Pre-existing perforator infarct in the left caudate head and putamen and previous small infarct in the parasagittal left occipital lobe. No new infarct detected. No hydrocephalus. No shift. IMPRESSION: 1. No acute finding. 2. Subacute left ACA territory infarcts without evidence of progression or hemorrhagic conversion. 3. Remote left basal ganglia and occipital cortex infarcts. Electronically Signed   By: Marnee Spring M.D.   On: 05/18/2015 01:05   Mr Brain Wo Contrast  05/18/2015  CLINICAL DATA:  Initial evaluation for increased right-sided weakness and dizziness with blurry vision. EXAM: MRI HEAD WITHOUT CONTRAST TECHNIQUE: Multiplanar, multiecho pulse sequences of the brain and surrounding structures were obtained without intravenous contrast. COMPARISON:  Prior CT from earlier same day as well as previous MRI from 05/15/2015. FINDINGS: Again seen is patchy and confluent restricted diffusion involving the left ACA territory, consistent with acute/subacute ischemic infarct. Overall, distribution and size of this infarct is not  significantly changed relative to recent MRI. Again, probable faint petechial hemorrhage without evidence for hemorrhagic transformation. No significant mass effect. No new areas of infarction. Major intracranial vascular flow voids are maintained. Previously identified left A2 and PCA occlusions not well seen on this exam. Age-related cerebral atrophy with mild chronic small vessel ischemic disease noted, stable. Remote lacunar infarct within the left basal ganglia/ corona radiata. Small remote lacunar infarct within the left thalamus. Additional remote left occipital lobe infarct. No mass lesion, midline shift, or mass effect. No hydrocephalus. No extra-axial fluid collection. Craniocervical junction normal. Mild degenerative spondylolysis within the visualized upper cervical spine. Pituitary gland normal.  No acute abnormality about the orbits. Paranasal sinuses and mastoid air cells are clear. Inner ear structures normal. Bone marrow signal intensity within normal limits. Scalp soft tissues unremarkable. IMPRESSION: 1. Normal expected interval evolution of acute/subacute left ACA territory infarct. No hemorrhagic transformation or other complication identified. 2. No other new or acute intracranial process. 3. Stable atrophy with chronic small vessel ischemic disease and remote infarcts as above. Electronically Signed   By: Rise Mu M.D.   On: 05/18/2015 02:58     EKG: Independently reviewed. Normal sinus Rhythm rate = 97        Assessment/Plan:   59 y.o. female with  Principal Problem:   1.     Elevated troponin I level- Demand Ischemia versus NSTEMI   Cardiac Monitoring   Cycle Troponins   Nitropaste, O2 ASA Rx   CARDs consulted      Active Problems:   2.     Leukocytosis- Reactive   Monitor Trend     3.     Late effects of CVA (cerebrovascular accident)   Stable   Notify PT/OT/Speech Rx while in Hospital     4.     Tobacco abuse   Nicotine Patch daily     5.      Alcohol abuse   Reported Abstinence since In Hospital     6.     Cocaine abuse   Reported Abstinence     7.     Polycythemia   Monitor Trend     8.     Essential  hypertension   Continue Hydralazine Rx     9.     DVT Prophylaxis   SQ Heparin    Code Status:     FULL CODE   Family Communication:   Daughter at Bedside     Disposition Plan:    Inpatient Status        Time spent:  2170 Minutes      Ron ParkerJENKINS,Bruce Churilla C [facility] Pager [phone]   If 7AM -7PM Please Contact the Day Rounding Team MD for [facility]  If 7PM-7AM, Please Contact Night-Floor Coverage  [email or web address] Password [email or web address] 05/18/2015, 6:12 AM     ADDENDUM:   Patient was seen and examined on 05/18/2015

## 2015-05-18 NOTE — ED Notes (Signed)
Patient transported to CT 

## 2015-05-18 NOTE — ED Notes (Signed)
Pt returned from MRI °

## 2015-05-19 ENCOUNTER — Inpatient Hospital Stay (HOSPITAL_COMMUNITY): Payer: Medicaid Other

## 2015-05-19 ENCOUNTER — Encounter (HOSPITAL_COMMUNITY): Payer: Self-pay | Admitting: Cardiology

## 2015-05-19 DIAGNOSIS — R799 Abnormal finding of blood chemistry, unspecified: Secondary | ICD-10-CM

## 2015-05-19 DIAGNOSIS — R7989 Other specified abnormal findings of blood chemistry: Secondary | ICD-10-CM

## 2015-05-19 DIAGNOSIS — R0789 Other chest pain: Secondary | ICD-10-CM

## 2015-05-19 LAB — NM MYOCAR MULTI W/SPECT W/WALL MOTION / EF
CHL CUP NUCLEAR SDS: 6
CHL CUP NUCLEAR SRS: 5
CHL CUP RESTING HR STRESS: 72 {beats}/min
CSEPED: 4 min
LV sys vol: 37 mL
LVDIAVOL: 96 mL
Peak HR: 110 {beats}/min
RATE: 0.08
SSS: 11
TID: 1.2

## 2015-05-19 LAB — BASIC METABOLIC PANEL
ANION GAP: 10 (ref 5–15)
BUN: 13 mg/dL (ref 6–20)
CALCIUM: 9.7 mg/dL (ref 8.9–10.3)
CHLORIDE: 105 mmol/L (ref 101–111)
CO2: 24 mmol/L (ref 22–32)
Creatinine, Ser: 0.88 mg/dL (ref 0.44–1.00)
GFR calc non Af Amer: >60 mL/min (ref >60)
GLUCOSE: 120 mg/dL — AB (ref 65–99)
POTASSIUM: 3.3 mmol/L — AB (ref 3.5–5.1)
Sodium: 139 mmol/L (ref 135–145)

## 2015-05-19 MED ORDER — AMLODIPINE BESYLATE 5 MG PO TABS
5.0000 mg | ORAL_TABLET | Freq: Every day | ORAL | Status: DC
  Administered 2015-05-19 – 2015-05-20 (×2): 5 mg via ORAL
  Filled 2015-05-19 (×2): qty 1

## 2015-05-19 MED ORDER — SODIUM CHLORIDE 0.9 % IV SOLN
INTRAVENOUS | Status: AC
  Administered 2015-05-19: 20:00:00 via INTRAVENOUS

## 2015-05-19 MED ORDER — TECHNETIUM TC 99M SESTAMIBI GENERIC - CARDIOLITE
30.0000 | Freq: Once | INTRAVENOUS | Status: AC | PRN
  Administered 2015-05-19: 30 via INTRAVENOUS

## 2015-05-19 MED ORDER — REGADENOSON 0.4 MG/5ML IV SOLN
INTRAVENOUS | Status: AC
  Filled 2015-05-19: qty 5

## 2015-05-19 MED ORDER — POTASSIUM CHLORIDE CRYS ER 20 MEQ PO TBCR
40.0000 meq | EXTENDED_RELEASE_TABLET | Freq: Once | ORAL | Status: AC
  Administered 2015-05-19: 40 meq via ORAL
  Filled 2015-05-19: qty 2

## 2015-05-19 MED ORDER — REGADENOSON 0.4 MG/5ML IV SOLN
0.4000 mg | Freq: Once | INTRAVENOUS | Status: AC
  Administered 2015-05-19: 0.4 mg via INTRAVENOUS
  Filled 2015-05-19: qty 5

## 2015-05-19 MED ORDER — DIPHENHYDRAMINE HCL 25 MG PO CAPS
25.0000 mg | ORAL_CAPSULE | Freq: Three times a day (TID) | ORAL | Status: DC | PRN
  Administered 2015-05-19: 25 mg via ORAL
  Filled 2015-05-19: qty 1

## 2015-05-19 MED ORDER — DEXAMETHASONE 4 MG PO TABS
2.0000 mg | ORAL_TABLET | Freq: Every day | ORAL | Status: DC
  Administered 2015-05-20: 2 mg via ORAL
  Filled 2015-05-19: qty 1

## 2015-05-19 MED ORDER — ISOSORBIDE MONONITRATE ER 30 MG PO TB24
30.0000 mg | ORAL_TABLET | Freq: Every day | ORAL | Status: DC
Start: 2015-05-19 — End: 2015-05-20
  Administered 2015-05-19 – 2015-05-20 (×2): 30 mg via ORAL
  Filled 2015-05-19 (×2): qty 1

## 2015-05-19 MED ORDER — TECHNETIUM TC 99M SESTAMIBI GENERIC - CARDIOLITE
10.0000 | Freq: Once | INTRAVENOUS | Status: AC | PRN
  Administered 2015-05-19: 10 via INTRAVENOUS

## 2015-05-19 MED ORDER — POTASSIUM CHLORIDE CRYS ER 20 MEQ PO TBCR: 40.0000 meq | EXTENDED_RELEASE_TABLET | Freq: Once | ORAL | Status: DC

## 2015-05-19 NOTE — Progress Notes (Signed)
Subjective:  No chest pain  Objective:  Vital Signs in the last 24 hours: Temp:  [97.5 F (36.4 C)-98.2 F (36.8 C)] 98.2 F (36.8 C) (11/14 0359) Pulse Rate:  [72-101] 74 (11/14 0940) Resp:  [17-18] 18 (11/14 0359) BP: (147-166)/(63-77) 147/77 mmHg (11/14 0940) SpO2:  [98 %-100 %] 100 % (11/14 0359)  Intake/Output from previous day:  Intake/Output Summary (Last 24 hours) at 05/19/15 0953 Last data filed at 05/18/15 1300  Gross per 24 hour  Intake    240 ml  Output      0 ml  Net    240 ml    Physical Exam: General appearance: alert, cooperative and no distress Neck: no carotid bruit and no JVD Lungs: clear to auscultation bilaterally Heart: regular rate and rhythm Extremities: extremities normal, atraumatic, no cyanosis or edema Skin: Skin color, texture, turgor normal. No rashes or lesions Neurologic: Grossly normal, residual speech deficit   Rate: 77  Rhythm: normal sinus rhythm  Lab Results:  Recent Labs  05/17/15 0603 05/18/15 0133  WBC 13.2* 14.2*  HGB 14.6 16.2*  PLT 268 284    Recent Labs  05/18/15 0133 05/19/15 0440  NA 138 139  K 3.4* 3.3*  CL 107 105  CO2 22 24  GLUCOSE 190* 120*  BUN 8 13  CREATININE 0.81 0.88    Recent Labs  05/18/15 0133 05/18/15 0449  TROPONINI 0.04* 0.11*   No results for input(s): INR in the last 72 hours.  Scheduled Meds: . aspirin  325 mg Oral Daily  . atorvastatin  40 mg Oral q1800  . dexamethasone  4 mg Oral Daily  . digoxin  250 mcg Oral Daily  . enoxaparin (LOVENOX) injection  40 mg Subcutaneous Q24H  . folic acid  1 mg Oral Daily  . hydrALAZINE  50 mg Oral TID  . nicotine  14 mg Transdermal Daily  . nitroGLYCERIN  1 inch Topical 4 times per day  . pantoprazole (PROTONIX) IV  40 mg Intravenous Q12H  . regadenoson      . thiamine  100 mg Oral Daily   Continuous Infusions:  PRN Meds:.acetaminophen, cyclobenzaprine, nitroGLYCERIN, technetium sestamibi generic, traMADol   Imaging: Ct Head  Wo Contrast  05/18/2015  CLINICAL DATA:  Headache and weakness. EXAM: CT HEAD WITHOUT CONTRAST TECHNIQUE: Contiguous axial images were obtained from the base of the skull through the vertex without intravenous contrast. COMPARISON:  Head CT 4 days ago.  Brain MRI from 3 days ago FINDINGS: Skull and Sinuses:Negative for fracture or destructive process. The mastoids, middle ears, and imaged paranasal sinuses are clear. Orbits: No acute abnormality. Brain: Subacute infarct in the left ACA territory, from the left genu of the corpus callosum to the surface of the parasagittal left frontal lobe. No hemorrhagic conversion or evidence of extension. Pre-existing perforator infarct in the left caudate head and putamen and previous small infarct in the parasagittal left occipital lobe. No new infarct detected. No hydrocephalus. No shift. IMPRESSION: 1. No acute finding. 2. Subacute left ACA territory infarcts without evidence of progression or hemorrhagic conversion. 3. Remote left basal ganglia and occipital cortex infarcts. Electronically Signed   By: Marnee SpringJonathon  Watts M.D.   On: 05/18/2015 01:05   Mr Brain Wo Contrast  05/18/2015  CLINICAL DATA:  Initial evaluation for increased right-sided weakness and dizziness with blurry vision. EXAM: MRI HEAD WITHOUT CONTRAST TECHNIQUE: Multiplanar, multiecho pulse sequences of the brain and surrounding structures were obtained without intravenous contrast. COMPARISON:  Prior  CT from earlier same day as well as previous MRI from 05/15/2015. FINDINGS: Again seen is patchy and confluent restricted diffusion involving the left ACA territory, consistent with acute/subacute ischemic infarct. Overall, distribution and size of this infarct is not significantly changed relative to recent MRI. Again, probable faint petechial hemorrhage without evidence for hemorrhagic transformation. No significant mass effect. No new areas of infarction. Major intracranial vascular flow voids are  maintained. Previously identified left A2 and PCA occlusions not well seen on this exam. Age-related cerebral atrophy with mild chronic small vessel ischemic disease noted, stable. Remote lacunar infarct within the left basal ganglia/ corona radiata. Small remote lacunar infarct within the left thalamus. Additional remote left occipital lobe infarct. No mass lesion, midline shift, or mass effect. No hydrocephalus. No extra-axial fluid collection. Craniocervical junction normal. Mild degenerative spondylolysis within the visualized upper cervical spine. Pituitary gland normal.  No acute abnormality about the orbits. Paranasal sinuses and mastoid air cells are clear. Inner ear structures normal. Bone marrow signal intensity within normal limits. Scalp soft tissues unremarkable. IMPRESSION: 1. Normal expected interval evolution of acute/subacute left ACA territory infarct. No hemorrhagic transformation or other complication identified. 2. No other new or acute intracranial process. 3. Stable atrophy with chronic small vessel ischemic disease and remote infarcts as above. Electronically Signed   By: Rise Mu M.D.   On: 05/18/2015 02:58    Cardiac Studies: Echo 05/15/15 Study Conclusions  - Left ventricle: The cavity size was normal. There was mild concentric hypertrophy. Systolic function was normal. The estimated ejection fraction was in the range of 60% to 65%. Wall motion was normal; there were no regional wall motion abnormalities. Features are consistent with a pseudonormal left ventricular filling pattern, with concomitant abnormal relaxation and increased filling pressure (grade 2 diastolic dysfunction). - Aortic valve: Mild focal thickening involving the noncoronary cusp. There was mild to moderate regurgitation directed centrally in the LVOT. - Left atrium: The atrium was mildly dilated. - Atrial septum: No defect or patent foramen ovale was identified. - Tricuspid  valve: There was moderate regurgitation. - Pulmonary arteries: PA peak pressure: 35 mm Hg (S).  Assessment/Plan:  59 y.o. AA female returned to the hospital 05/17/15 with complaints of chest pain described as pressure. Troponin has been elevated. She was just discharge after an embolic stroke in the setting of positive cocaine use, drug screen negative now.   Principal Problem:   Chest pain Active Problems:   Cocaine abuse   Elevated troponin I level   Cerebrovascular accident (CVA) due to thrombosis of cerebral artery (HCC)   Accelerated hypertension   Leukocytosis   Tobacco abuse   Alcohol abuse   Polycythemia   PLAN: Lexiscan Myoview today  Corine Shelter PA-C 05/19/2015, 9:53 AM [phone]  Personally seen and examined. Agree with above. No further CP Await final results of NUC stress If low risk - OK to DC  Donato Schultz, MD

## 2015-05-19 NOTE — Progress Notes (Addendum)
Patient Demographics  [name], is a 59 y.o. female, DOB - August 01, 1955, WUJ:[id number]  Admit date - 05/17/2015   Admitting Physician Ron Parker, MD  Outpatient Primary MD for the patient is Melanie Courier, MD  LOS - 1   Chief Complaint  Patient presents with  . Chest Pain         Subjective:   [name] today has, No headache, has muscular chest wall pain on palpation, No abdominal pain - No Nausea, No new weakness tingling or numbness, No Cough - SOB.   Assessment & Plan    Principal Problem:   Chest pain Active Problems:   Tobacco abuse   Alcohol abuse   Cocaine abuse   Accelerated hypertension   Leukocytosis   Polycythemia   Elevated troponin I level   Cerebrovascular accident (CVA) due to thrombosis of cerebral artery (HCC)  Chest pain with elevated troponins - Cardiology consult appreciated, nuclear stress test with intermediate risk, possible anterior lateral wall ischemia, normal ejection fraction, - Chest pain with some nontypical features, but she has some risk factors for coronary artery disease including cocaine use, hypertension, - At this point plan is for noninvasive workup giving her recent CVA, cocaine use, Imdur was added, amlodipine was added, she is already on aspirin, statin - Monitor overnight  Recent diagnosis of acute subacute CVA - Patient with residual deficits mild right-sided hemiparesis, mild dementia - Continue with aspirin and statin  Hypertension - Continue with hydralazine  Hypokalemia - Repleted, recheck in a.m.  Hyperlipidemia - Continue statin  Tobacco abuse - And nicotine patch  Cocaine /alcohol abuse - None since discharge  Leukocytosis - Chronic, afebrile, no evidence of infection, most likely due to chronic steroid use  Chronic Steroid Use - suspect on it for arthritis-per patient on decadron for 6 will  decrease from 4 mg to 2 mg total daily, will defer further taper as an outpatient  Code Status: Full  Family Communication: None at bedside  Disposition Plan: Home in stable   Procedures  None   Consults   Cardiology   Medications  Scheduled Meds: . amLODipine  5 mg Oral Daily  . aspirin  325 mg Oral Daily  . atorvastatin  40 mg Oral q1800  . [START ON 05/20/2015] dexamethasone  2 mg Oral Daily  . enoxaparin (LOVENOX) injection  40 mg Subcutaneous Q24H  . folic acid  1 mg Oral Daily  . hydrALAZINE  50 mg Oral TID  . isosorbide mononitrate  30 mg Oral Daily  . nicotine  14 mg Transdermal Daily  . pantoprazole (PROTONIX) IV  40 mg Intravenous Q12H  . potassium chloride  40 mEq Oral Once  . regadenoson      . thiamine  100 mg Oral Daily   Continuous Infusions: . sodium chloride     PRN Meds:.acetaminophen, cyclobenzaprine, diphenhydrAMINE, nitroGLYCERIN, traMADol  DVT Prophylaxis  Lovenox -  Lab Results  Component Value Date   PLT 284 05/18/2015    Antibiotics    Anti-infectives    None          Objective:   Filed Vitals:   05/19/15 0956 05/19/15 0958 05/19/15 1115 05/19/15 1330  BP: 161/68  165/79 153/74  Pulse: 104 98  90 85  Temp:    97.7 F (36.5 C)  TempSrc:    Oral  Resp:    20  Height:      Weight:      SpO2:   100% 96%    Wt Readings from Last 3 Encounters:  05/18/15 61.644 kg (135 lb 14.4 oz)  05/15/15 61.326 kg (135 lb 3.2 oz)  05/23/14 70.761 kg (156 lb)     Intake/Output Summary (Last 24 hours) at 05/19/15 1726 Last data filed at 05/19/15 0800  Gross per 24 hour  Intake      0 ml  Output      0 ml  Net      0 ml     Physical Exam  Awake Alert, Oriented X 3, Andrews.AT,PERRAL Supple Neck,No JVD, No cervical lymphadenopathy appriciated.  Symmetrical Chest wall movement, Good air movement bilaterally, with usable chest pain on palpation RRR,No Gallops,Rubs or new Murmurs, No Parasternal Heave +ve B.Sounds, Abd Soft, No  tenderness, No organomegaly appriciated, No rebound - guarding or rigidity. No Cyanosis, Clubbing or edema, No new Rash or bruise    Data Review   Micro Results Recent Results (from the past 240 hour(s))  Culture, blood (routine x 2)     Status: None (Preliminary result)   Collection Time: 05/15/15 11:52 AM  Result Value Ref Range Status   Specimen Description BLOOD LEFT HAND  Final   Special Requests BOTTLES DRAWN AEROBIC AND ANAEROBIC 5CC  Final   Culture NO GROWTH 4 DAYS  Final   Report Status PENDING  Incomplete  Culture, blood (routine x 2)     Status: None (Preliminary result)   Collection Time: 05/15/15 12:00 PM  Result Value Ref Range Status   Specimen Description BLOOD RIGHT HAND  Final   Special Requests BOTTLES DRAWN AEROBIC AND ANAEROBIC 5CC  Final   Culture NO GROWTH 4 DAYS  Final   Report Status PENDING  Incomplete    Radiology Reports Dg Chest 2 View  05/15/2015  CLINICAL DATA:  59 year old female with a history of stroke, weakness EXAM: CHEST - 2 VIEW COMPARISON:  08/12/2014 FINDINGS: Cardiomediastinal silhouette unchanged. Atherosclerotic calcifications of the aortic arch. Stigmata of emphysema, with increased retrosternal airspace, flattened hemidiaphragms, increased AP diameter, and hyperinflation on the AP view. Similar appearance of interstitial opacities with no confluent airspace disease. No pleural effusion or pneumothorax. No interlobular septal thickening. Degenerative changes of the spine. Unremarkable appearance of the upper abdomen. IMPRESSION: Emphysema and chronic lung scarring with no definite evidence of superimposed acute cardiopulmonary disease. Signed, Yvone Neu. Loreta Ave, DO [facility] Radiology Electronically Signed   By: Gilmer Mor D.O.   On: 05/15/2015 06:59   Ct Head Wo Contrast  05/18/2015  CLINICAL DATA:  Headache and weakness. EXAM: CT HEAD WITHOUT CONTRAST TECHNIQUE: Contiguous axial images  were obtained from the base of the skull through the vertex without intravenous contrast. COMPARISON:  Head CT 4 days ago.  Brain MRI from 3 days ago FINDINGS: Skull and Sinuses:Negative for fracture or destructive process. The mastoids, middle ears, and imaged paranasal sinuses are clear. Orbits: No acute abnormality. Brain: Subacute infarct in the left ACA territory, from the left genu of the corpus callosum to the surface of the parasagittal left frontal lobe. No hemorrhagic conversion or evidence of extension. Pre-existing perforator infarct in the left caudate head and putamen and previous small infarct in the parasagittal left occipital lobe. No new infarct detected. No hydrocephalus. No  shift. IMPRESSION: 1. No acute finding. 2. Subacute left ACA territory infarcts without evidence of progression or hemorrhagic conversion. 3. Remote left basal ganglia and occipital cortex infarcts. Electronically Signed   By: Marnee SpringJonathon  Watts M.D.   On: 05/18/2015 01:05   Ct Head Wo Contrast  05/14/2015  CLINICAL DATA:  2-5 day history of headache, blurry vision, slurred speech and right hand and leg pain. EXAM: CT HEAD WITHOUT CONTRAST TECHNIQUE: Contiguous axial images were obtained from the base of the skull through the vertex without intravenous contrast. COMPARISON:  05/24/2014. FINDINGS: There is an area of low attenuation in the central aspect of the left frontal lobe in the ACA territory consistent with a acute or subacute cortical infarct. No hemorrhage. No hemispheric infarction. Remote lacunar type infarct noted in the left caudate area. The brainstem and cerebellum are normal. The bony structures are intact. The paranasal sinuses and mastoid air cells are clear. IMPRESSION: 1. Acute or subacute left ACA territory infarct.  No hemorrhage. 2. Remote lacunar type infarct in the left caudate area. These results were called by telephone at the time of interpretation on 05/14/2015 at 11:11 pm to Dr. Azalia BilisKEVIN CAMPOS , who  verbally acknowledged these results. Electronically Signed   By: Rudie MeyerP.  Gallerani M.D.   On: 05/14/2015 23:11   Mr Brain Wo Contrast  05/18/2015  CLINICAL DATA:  Initial evaluation for increased right-sided weakness and dizziness with blurry vision. EXAM: MRI HEAD WITHOUT CONTRAST TECHNIQUE: Multiplanar, multiecho pulse sequences of the brain and surrounding structures were obtained without intravenous contrast. COMPARISON:  Prior CT from earlier same day as well as previous MRI from 05/15/2015. FINDINGS: Again seen is patchy and confluent restricted diffusion involving the left ACA territory, consistent with acute/subacute ischemic infarct. Overall, distribution and size of this infarct is not significantly changed relative to recent MRI. Again, probable faint petechial hemorrhage without evidence for hemorrhagic transformation. No significant mass effect. No new areas of infarction. Major intracranial vascular flow voids are maintained. Previously identified left A2 and PCA occlusions not well seen on this exam. Age-related cerebral atrophy with mild chronic small vessel ischemic disease noted, stable. Remote lacunar infarct within the left basal ganglia/ corona radiata. Small remote lacunar infarct within the left thalamus. Additional remote left occipital lobe infarct. No mass lesion, midline shift, or mass effect. No hydrocephalus. No extra-axial fluid collection. Craniocervical junction normal. Mild degenerative spondylolysis within the visualized upper cervical spine. Pituitary gland normal.  No acute abnormality about the orbits. Paranasal sinuses and mastoid air cells are clear. Inner ear structures normal. Bone marrow signal intensity within normal limits. Scalp soft tissues unremarkable. IMPRESSION: 1. Normal expected interval evolution of acute/subacute left ACA territory infarct. No hemorrhagic transformation or other complication identified. 2. No other new or acute intracranial process. 3. Stable  atrophy with chronic small vessel ischemic disease and remote infarcts as above. Electronically Signed   By: Rise MuBenjamin  McClintock M.D.   On: 05/18/2015 02:58   Mr Brain Wo Contrast  05/15/2015  CLINICAL DATA:  Initial evaluation for right lower extremity weakness. EXAM: MRI HEAD WITHOUT CONTRAST MRA HEAD WITHOUT CONTRAST TECHNIQUE: Multiplanar, multiecho pulse sequences of the brain and surrounding structures were obtained without intravenous contrast. Angiographic images of the head were obtained using MRA technique without contrast. COMPARISON:  Prior CT from 05/14/2015. FINDINGS: MRI HEAD FINDINGS Patchy and confluent areas of restricted diffusion seen throughout the parasagittal left frontal lobe extending posteriorly towards the left parietal lobe, consistent with acute left ACA territory infarct. Largest  confluent area of restricted diffusion measures 3.2 x 2.0 cm. There is involvement of the anterior left corpus callosum. Associated gyral swelling without significant mass effect. Faint petechial hemorrhage without hemorrhagic transformation. No right cerebral or infratentorial infarcts. Major intravascular flow voids are maintained. No acute or chronic intracranial hemorrhage. Age-related cerebral atrophy with mild chronic microvascular ischemic disease. Remote lacunar infarct within the left basal ganglia/corona radiata. Probable small additional remote lacunar infarct within the left thalamus. Remote cortical infarct within the parasagittal left occipital lobe. No mass lesion, midline shift, or mass effect. No hydrocephalus. No extra-axial fluid collection. Craniocervical junction within normal limits. Mild degenerative spondylolysis within the upper cervical spine. Pituitary gland normal.  No acute abnormality about the orbits. Paranasal sinuses are clear. No mastoid effusion. Inner ear structures within normal limits. Bone marrow signal intensity normal. No scalp soft tissue abnormality. MRA HEAD  FINDINGS ANTERIOR CIRCULATION: Visualized distal cervical segments of the internal carotid arteries are patent with antegrade flow. The petrous, cavernous, and supraclinoid segments are patent without high-grade stenosis. Left A1 segment patent. Right A1 segment hypoplastic. Anterior communicating artery normal. Atheromatous irregularity within the right A2 segment. There is occlusion of the left A2 segment (series 6, image 97). M1 segments opacified without proximal arterial branch occlusion. There is focal moderate stenosis within the distal left M1 segment (series 802, image 14). Distal MCA branches are opacified bilaterally. POSTERIOR CIRCULATION: Left vertebral artery dominant and patent to the vertebrobasilar junction. Diminutive right vertebral artery patent as well. Left posterior inferior cerebral artery patent. Right PICA not visualized. Basilar artery widely patent. Left SCA opacified. Right SCA is faintly visualized on time-of-flight. Right P1 segment widely patent. There is a focal moderate to severe stenosis within the mid right P2 segment (series 803, image 18). Left P1 segment patent. There is apparent occlusion of the proximal left P2 segment. Patent left posterior communicating artery noted. No aneurysm. IMPRESSION: MRI HEAD IMPRESSION: 1. Acute left ACA territory infarct. There is faint petechial hemorrhage without hemorrhagic transformation or significant mass effect. 2. Remote left occipital cortical infarct, with additional remote lacunar infarcts involving the left basal ganglia/corona radiata and left thalamus. 3. Mild chronic small vessel ischemic disease. MRA HEAD IMPRESSION: 1. Occlusion of the proximal left A2 segment. 2. Occlusion of the proximal left PCA, likely chronic given the chronic left occipital infarct. 3. Focal moderate to severe stenosis within the mid right P2 segment. 4. Focal moderate stenosis within the distal left M1 segment. 5. Multifocal atheromatous irregularity within  the right ACA. Electronically Signed   By: Rise Mu M.D.   On: 05/15/2015 04:58   Nm Myocar Multi W/spect W/wall Motion / Ef  05/19/2015   There was no ST segment deviation noted during stress.  Defect 1: There is a large defect of mild severity.  Findings consistent with ischemia.  This is an intermediate risk study.  Nuclear stress EF: 61%.  The left ventricular ejection fraction is normal (55-65%).  Intermediate risk study with extensive, but very mild ischemia in the anterior and anterolateral distribution, with sparing of the apex and septum. This suggests ischemia due to stenosis in the territory of a large diagonal or ramus intermedius artery. "Shifting breast" attenuation may produce a similar appearance, but appears less likely based on raw data images. Normal LV regional and global systolic function.   Mr Palma Holter  05/15/2015  CLINICAL DATA:  Initial evaluation for right lower extremity weakness. EXAM: MRI HEAD WITHOUT CONTRAST MRA HEAD WITHOUT CONTRAST TECHNIQUE: Multiplanar, multiecho  pulse sequences of the brain and surrounding structures were obtained without intravenous contrast. Angiographic images of the head were obtained using MRA technique without contrast. COMPARISON:  Prior CT from 05/14/2015. FINDINGS: MRI HEAD FINDINGS Patchy and confluent areas of restricted diffusion seen throughout the parasagittal left frontal lobe extending posteriorly towards the left parietal lobe, consistent with acute left ACA territory infarct. Largest confluent area of restricted diffusion measures 3.2 x 2.0 cm. There is involvement of the anterior left corpus callosum. Associated gyral swelling without significant mass effect. Faint petechial hemorrhage without hemorrhagic transformation. No right cerebral or infratentorial infarcts. Major intravascular flow voids are maintained. No acute or chronic intracranial hemorrhage. Age-related cerebral atrophy with mild chronic microvascular  ischemic disease. Remote lacunar infarct within the left basal ganglia/corona radiata. Probable small additional remote lacunar infarct within the left thalamus. Remote cortical infarct within the parasagittal left occipital lobe. No mass lesion, midline shift, or mass effect. No hydrocephalus. No extra-axial fluid collection. Craniocervical junction within normal limits. Mild degenerative spondylolysis within the upper cervical spine. Pituitary gland normal.  No acute abnormality about the orbits. Paranasal sinuses are clear. No mastoid effusion. Inner ear structures within normal limits. Bone marrow signal intensity normal. No scalp soft tissue abnormality. MRA HEAD FINDINGS ANTERIOR CIRCULATION: Visualized distal cervical segments of the internal carotid arteries are patent with antegrade flow. The petrous, cavernous, and supraclinoid segments are patent without high-grade stenosis. Left A1 segment patent. Right A1 segment hypoplastic. Anterior communicating artery normal. Atheromatous irregularity within the right A2 segment. There is occlusion of the left A2 segment (series 6, image 97). M1 segments opacified without proximal arterial branch occlusion. There is focal moderate stenosis within the distal left M1 segment (series 802, image 14). Distal MCA branches are opacified bilaterally. POSTERIOR CIRCULATION: Left vertebral artery dominant and patent to the vertebrobasilar junction. Diminutive right vertebral artery patent as well. Left posterior inferior cerebral artery patent. Right PICA not visualized. Basilar artery widely patent. Left SCA opacified. Right SCA is faintly visualized on time-of-flight. Right P1 segment widely patent. There is a focal moderate to severe stenosis within the mid right P2 segment (series 803, image 18). Left P1 segment patent. There is apparent occlusion of the proximal left P2 segment. Patent left posterior communicating artery noted. No aneurysm. IMPRESSION: MRI HEAD  IMPRESSION: 1. Acute left ACA territory infarct. There is faint petechial hemorrhage without hemorrhagic transformation or significant mass effect. 2. Remote left occipital cortical infarct, with additional remote lacunar infarcts involving the left basal ganglia/corona radiata and left thalamus. 3. Mild chronic small vessel ischemic disease. MRA HEAD IMPRESSION: 1. Occlusion of the proximal left A2 segment. 2. Occlusion of the proximal left PCA, likely chronic given the chronic left occipital infarct. 3. Focal moderate to severe stenosis within the mid right P2 segment. 4. Focal moderate stenosis within the distal left M1 segment. 5. Multifocal atheromatous irregularity within the right ACA. Electronically Signed   By: Rise Mu M.D.   On: 05/15/2015 04:58     CBC  Recent Labs Lab 05/14/15 2200 05/14/15 2219 05/16/15 0415 05/17/15 0603 05/18/15 0133  WBC 11.0*  --  14.1* 13.2* 14.2*  HGB 17.6* 17.7* 16.3* 14.6 16.2*  HCT 48.3* 52.0* 45.7 42.0 45.0  PLT 276  --  290 268 284  MCV 87.3  --  87.9 89.9 88.4  MCH 31.8  --  31.3 31.3 31.8  MCHC 36.4*  --  35.7 34.8 36.0  RDW 13.2  --  13.3 13.6 13.2  LYMPHSABS 3.3  --   --   --  2.6  MONOABS 1.1*  --   --   --  1.3*  EOSABS 0.1  --   --   --  0.0  BASOSABS 0.0  --   --   --  0.0    Chemistries   Recent Labs Lab 05/14/15 2200 05/14/15 2219 05/16/15 0415 05/18/15 0133 05/19/15 0440  NA 136 140 138 138 139  K 3.6 3.5 3.8 3.4* 3.3*  CL 101 102 105 107 105  CO2 24  --  GLUCOSE 115* 115* 196* 190* 120*  BUN CREATININE 1.05* 1.00 0.93 0.81 0.88  CALCIUM 9.6  --  9.6 9.9 9.7  MG  --   --  2.2  --   --   AST 33  --   --  25  --   ALT 19  --   --  18  --   ALKPHOS 90  --   --  73  --   BILITOT 1.0  --   --  0.4  --    ------------------------------------------------------------------------------------------------------------------ estimated creatinine clearance is 60.4 mL/min (by C-G formula based  on Cr of 0.88). ------------------------------------------------------------------------------------------------------------------ No results for input(s): HGBA1C in the last 72 hours. ------------------------------------------------------------------------------------------------------------------ No results for input(s): CHOL, HDL, LDLCALC, TRIG, CHOLHDL, LDLDIRECT in the last 72 hours. ------------------------------------------------------------------------------------------------------------------ No results for input(s): TSH, T4TOTAL, T3FREE, THYROIDAB in the last 72 hours.  Invalid input(s): FREET3 ------------------------------------------------------------------------------------------------------------------ No results for input(s): VITAMINB12, FOLATE, FERRITIN, TIBC, IRON, RETICCTPCT in the last 72 hours.  Coagulation profile  Recent Labs Lab 05/14/15 2200  INR 1.01    No results for input(s): DDIMER in the last 72 hours.  Cardiac Enzymes  Recent Labs Lab 05/16/15 0420 05/18/15 0133 05/18/15 0449  TROPONINI 0.05* 0.04* 0.11*   ------------------------------------------------------------------------------------------------------------------ Invalid input(s): POCBNP     Time Spent in minutes  30 minutes   Chamberlain Steinborn M.D on 05/19/2015 at 5:26 PM  Between 7am to 7pm - Pager - [phone]  After 7pm go to [email or web address] - password [email or web address]  [facility]   Office  [phone]

## 2015-05-19 NOTE — Care Management Note (Signed)
Case Management Note  Patient Details  Name: Zoila ShutterRosemary Khatib MRN: [id number] Date of Birth: Nov 09, 1955  Subjective/Objective:     Pt admitted with CP             CVA Action/Plan:  Pt was discharged home on 05/17/15 s/p embolic stroke, returned to ED 05/17/15 with CP.  On past admit pt refused SNF placement due to self pay.  [facility], [facility], [facility],  CM will need resumption of HH orders prior to discharge.  CM contacted agency to inform of pt re admit. Expected Discharge Date:  05/17/15               Expected Discharge Plan:     In-House Referral:     Discharge planning Services     Post Acute Care Choice:    Choice offered to:     DME Arranged:    DME Agency:     HH Arranged:    HH Agency:     Status of Service:     Medicare Important Message Given:    Date Medicare IM Given:    Medicare IM give by:    Date Additional Medicare IM Given:    Additional Medicare Important Message give by:     If discussed at Long Length of Stay Meetings, dates discussed:    Additional Comments: Cm received call from CSW stating Medicaid will not cover cost of SNF and pt cannot pay out of pocket.   CM spoke with pt who in turn gave the phone to Raliegh IpAngela Whitaker [phone]; Marylene Landngela will be staying with pt and is primary contact.  Pt chooses [facility] tor render HHPT/OT/RN/Aide/SW.  CM text requested HH orders from MD. [facility]. CM called [facility] DME rep, Trey PaulaJeff to please deliver the 3n1 and rolling walker to room prior to discharge.  No other CM needs were communicated. Cherylann Parrlaxton, Elise Gladden S, RN 05/19/2015, 2:14 PM Case Management Note  Patient Details  Name: Zoila ShutterRosemary Graw MRN: [id number] Date of Birth: Nov 09, 1955  Subjective/Objective:                    Action/Plan:   Expected Discharge Date:  05/20/15               Expected Discharge Plan:     In-House Referral:     Discharge planning Services     Post Acute Care Choice:    Choice  offered to:     DME Arranged:    DME Agency:     HH Arranged:    HH Agency:     Status of Service:     Medicare Important Message Given:    Date Medicare IM Given:    Medicare IM give by:    Date Additional Medicare IM Given:    Additional Medicare Important Message give by:     If discussed at Long Length of Stay Meetings, dates discussed:    Additional Comments:  Cherylann ParrClaxton, Lanita Stammen S, RN 05/19/2015, 2:14 PM

## 2015-05-19 NOTE — Progress Notes (Signed)
     NUC stress was intermediate risk. Possible anterolateral wall ischemia. Normal EF, Normal EF on ECHO as well.   CP is quite atypical at times but nonetheless, she could very well have CAD (cocaine use, HTN). Discussed with her.   Given that the NUC stress test is not high risk, and given her recent CVA in association with cocaine (was UTOX pos 05/14/15) would opt for non invasive strategy.   Will start Imdur 30mg  Amlodipine 5mg   Hopeful DC tomorrow.   Donato SchultzSKAINS, Krysia Zahradnik, MD

## 2015-05-20 LAB — BASIC METABOLIC PANEL
Anion gap: 10 (ref 5–15)
BUN: 13 mg/dL (ref 6–20)
CHLORIDE: 106 mmol/L (ref 101–111)
CO2: 23 mmol/L (ref 22–32)
Calcium: 9.5 mg/dL (ref 8.9–10.3)
Creatinine, Ser: 0.86 mg/dL (ref 0.44–1.00)
Glucose, Bld: 140 mg/dL — ABNORMAL HIGH (ref 65–99)
Potassium: 3.6 mmol/L (ref 3.5–5.1)
Sodium: 139 mmol/L (ref 135–145)

## 2015-05-20 LAB — CULTURE, BLOOD (ROUTINE X 2)
CULTURE: NO GROWTH
CULTURE: NO GROWTH

## 2015-05-20 MED ORDER — AMLODIPINE BESYLATE 5 MG PO TABS: 5.0000 mg | ORAL_TABLET | Freq: Every day | ORAL | Status: DC

## 2015-05-20 MED ORDER — DEXAMETHASONE 2 MG PO TABS: 2.0000 mg | ORAL_TABLET | Freq: Every day | ORAL | Status: DC

## 2015-05-20 MED ORDER — POTASSIUM CHLORIDE ER 10 MEQ PO TBCR: 10.0000 meq | EXTENDED_RELEASE_TABLET | Freq: Every day | ORAL | Status: DC

## 2015-05-20 MED ORDER — ISOSORBIDE MONONITRATE ER 30 MG PO TB24: 30.0000 mg | ORAL_TABLET | Freq: Every day | ORAL | Status: DC

## 2015-05-20 NOTE — Care Management Note (Addendum)
Case Management Note  Patient Details  Name: Zoila ShutterRosemary Minichiello MRN: [id number] Date of Birth: 1956-02-27  Subjective/Objective:     Pt admitted with CP             CVA Action/Plan:  Pt was discharged home on 05/17/15 s/p embolic stroke, returned to ED 05/17/15 with CP.  On past admit pt refused SNF placement due to self pay.  [facility], OT, A[facility], [facility],  CM will need resumption of HH orders prior to discharge.  CM contacted agency to inform of pt re admit. Expected Discharge Date:  05/17/15               Expected Discharge Plan:     In-House Referral:     Discharge planning Services     Post Acute Care Choice:    Choice offered to:     DME Arranged:    DME Agency:     HH Arranged:   RN, PT, OT, Aide, SW (resumption) HH Agency:   [facility]  Status of Service:    Complete, will sign off  Medicare Important Message Given:    Date Medicare IM Given:    Medicare IM give by:    Date Additional Medicare IM Given:    Additional Medicare Important Message give by:     If discussed at Long Length of Stay Meetings, dates discussed:    Additional Comments:  CM assessed pt 05/19/15.  CM offered choice to resume services, [facility], agency contacted and referral accepted, agency made aware of discharge today

## 2015-05-20 NOTE — Care Management Note (Signed)
Case Management Note  Patient Details  Name: Zoila ShutterRosemary Hilgert MRN: [id number] Date of Birth: 01/08/1956  Subjective/Objective:     Pt admitted with CP             CVA Action/Plan:  Pt was discharged home on 05/17/15 s/p embolic stroke, returned to ED 05/17/15 with CP.  On past admit pt refused SNF placement due to self pay.  [facility], OT, Aide, [facility],  CM will need resumption of HH orders prior to discharge.  CM contacted agency to inform of pt re admit. Expected Discharge Date:  05/17/15               Expected Discharge Plan:     In-House Referral:     Discharge planning Services     Post Acute Care Choice:    Choice offered to:     DME Arranged:    DME Agency:     HH Arranged:   RN, PT, OT, Aide, SW  [facility]    [facility]  Status of Service:    Complete, will sign off  Medicare Important Message Given:    Date Medicare IM Given:    Medicare IM give by:    Date Additional Medicare IM Given:    Additional Medicare Important Message give by:     If discussed at Long Length of Stay Meetings, dates discussed:    Additional Comments: Pt daughter Marcelino DusterMichelle at bedside; informed CM that she will take pt home with her in [place] until she is strong enough to live alone again.  Discharge address is [address], [place] [id number] contact number [phone].  D[facility].  CM offered [facility] to [facility] agencies in [place], chose [facility], CM contacted agency liaison, referral accepted.  CM contacted [facility] and requested to cancel referral.  CM spoke with daughter; daughter will pick pt up from hospital.  Pt stays alone and daughter along with friend can provide support as needed. CM educated/clarified with daughter that [facility] not provide 24 hour supervision. Pts daughter is actively working on a plan to provide as much supervision as possible post discharge.   CM contacted CSW to request review of  case for possible LOG; pt is not appropriate for LOG.  Pt/daughter are still not agreeable to paying for SNF out of pocket.  Pt will be discharged home in care of [facility].  CM contacted Raliegh Ipngela Whitaker [phone] to inform of pt discharge, CM was informed that pts daughter Marcelino DusterMichelle will pick up pt and stay with pt in her home.  CM contacted daughter and left voicemail.  CM assessed pt 05/19/15.  CM offered choice to resume services, [facility], agency contacted and referral accepted, agency made aware of discharge today

## 2015-05-20 NOTE — Discharge Summary (Addendum)
[name], is a 59 y.o. female  DOB 08/28/1955  MRN [id number].  Admission date:  05/17/2015  Admitting Physician  Ron Parker, MD  Discharge Date:  05/20/2015   Primary MD  Evlyn Courier, MD  Recommendations for primary care physician for things to follow:  - Check labs including CBC, BMP during next visit - Patient will need to follow with cardiology as an outpatient - Continue counseling about cocaine and alcohol cessation - Patient will need to follow with EP cardiology as an outpatient regarding recorder insertion, and to follow with cardiology regarding coronary artery disease. - Continue steroid taper as an outpatient if no indication to continue steroids.  Admission Diagnosis  Elevated troponin I level [R79.89] Essential hypertension [I10] Cerebrovascular accident (CVA) due to thrombosis of cerebral artery (HCC) [I63.30]   Discharge Diagnosis  Elevated troponin I level [R79.89] Essential hypertension [I10] Cerebrovascular accident (CVA) due to thrombosis of cerebral artery (HCC) [I63.30]    Principal Problem:   Chest pain Active Problems:   Tobacco abuse   Alcohol abuse   Cocaine abuse   Accelerated hypertension   Leukocytosis   Polycythemia   Elevated troponin I level   Cerebrovascular accident (CVA) due to thrombosis of cerebral artery [facility])      Past Medical History  Diagnosis Date  . Hypertension   . Complication of anesthesia     Heart attack during surgery  . Myocardial infarction (HCC)   . Heart murmur   . Shortness of breath   . Pneumonia     hx of pna  . Dysrhythmia     atrial fibrilation  . GERD (gastroesophageal reflux disease)   . Arthritis     Past Surgical History  Procedure Laterality Date  . Total hip arthroplasty  2010  . Replacement total knee bilateral    . Shoulder arthroscopy w/ rotator cuff repair    . Skin graft    . Abdominal  hysterectomy    . Hip surgery  12/09/2011    revision  . Total hip revision  12/09/2011    Procedure: TOTAL HIP REVISION;  Surgeon: Kennieth Rad, MD;  Location: [facility] OR;  Service: Orthopedics;  Laterality: Left;  . Tee without cardioversion N/A 05/16/2015    Procedure: TRANSESOPHAGEAL ECHOCARDIOGRAM (TEE);  Surgeon: Quintella Reichert, MD;  Location: [facility] ENDOSCOPY;  Service: Cardiovascular;  Laterality: N/A;       History of present illness and  Hospital Course:     Kindly see H&P for history of present illness and admission details, please review complete Labs, Consult reports and Test reports for all details in brief  HPI  from the history and physical done on the day of admission [name] is a 59 y.o. female with history of Polysubstance Abuse,HTN, Atrial fibrillation, and a Recent CVA with Right Hemiparesis and Dysarthria discharged from the Hospital yesterday 05/17/2015 and returns to the ED with complaints of 7/10 substernal Chest Pain that lasted for about 2.5 hours, and resolved after taking 1 Aspirin and 2  SL NTG given by EMS. She denies any SOB or nausea or Diaphoresis. Her pain was described as a tightness in her chest. Her EKG revealed no acute findings, however the initial Troponin was 0.04 and then next had increased to 0.11. Cardiology was consulted to see patient and she was referred for admission.    Hospital Course   Chest pain with elevated troponins - Cardiology consult appreciated, nuclear stress test with intermediate risk, possible anterior lateral wall ischemia, normal ejection fraction, - Chest pain with some nontypical features, but she has some risk factors for coronary artery disease including cocaine use, hypertension, - At this point plan is for noninvasive workup giving her recent CVA, cocaine use, so Imdur and amlodipine was added, no beta blocker given her cocaine use, she is already on aspirin, statin, was monitored overnight, no recurrence of  chest pain or dyspnea.  Recent diagnosis of acute subacute CVA - Patient with residual deficits mild right-sided hemiparesis, mild dementia - Continue with aspirin and statin - Recommendation for loop recorder insertion from previous discharge  Hypertension - Continue with hydralazine, Imdur and amlodipine added on discharge.  Hypokalemia - Repleted, we'll discharge on oral supplement  Hyperlipidemia - Continue statin  Tobacco abuse -  nicotine patch  Cocaine /alcohol abuse - None since discharge  Leukocytosis - Chronic, afebrile, no evidence of infection, most likely due to chronic steroid use  Chronic Steroid Use - suspect on it for arthritis-per patient on decadron for 6 months, dose was decreased from 4-2 mg oral daily, will defer further taper as an outpatient   Discharge Condition:  Stable   Follow UP  Follow-up Information    Follow up with [facility].   Why:  Registered Nurse, Occupational Therapist, Physical therapist, Aide, Social Worker   Contact information:   [address] [place] [address] [phone]       Call Evlyn Courier, MD.   Specialty:  Family Medicine   Why:  Posthospitalization follow-up   Contact information:   [address] [place] [address] [phone]       Follow up with Sherryl Manges, MD. Schedule an appointment as soon as possible for a visit in 2 weeks.   Specialty:  Cardiology   Why:  loop recorder   Contact information:   [address] [place] [address] [phone]       Follow up with Donato Schultz, MD. Schedule an appointment as soon as possible for a visit in 3 weeks.   Specialty:  Cardiology   Why:  Posthospitalization follow-up   Contact information:   [address] [place] [address] [phone]         Discharge Instructions  and  Discharge Medications         Discharge Instructions    Diet - low sodium heart  healthy    Complete by:  As directed      Discharge instructions    Complete by:  As directed   Follow with Primary MD HILL,[name], MD in 7 days   Get CBC, CMP, 2 view Chest X ray checked  by Primary MD next visit.    Activity: As tolerated with Full fall precautions use walker/cane & assistance as needed   Disposition Home    Diet: Heart Healthy  , with feeding assistance and aspiration precautions.  For Heart failure patients - Check your Weight same time everyday, if you gain over 2  pounds, or you develop in leg swelling, experience more shortness of breath or chest pain, call your Primary MD immediately. Follow Cardiac Low Salt Diet and 1.5 lit/day fluid restriction.   On your next visit with your primary care physician please Get Medicines reviewed and adjusted.   Please request your Prim.MD to go over all Hospital Tests and Procedure/Radiological results at the follow up, please get all Hospital records sent to your Prim MD by signing hospital release before you go home.   If you experience worsening of your admission symptoms, develop shortness of breath, life threatening emergency, suicidal or homicidal thoughts you must seek medical attention immediately by calling 911 or calling your MD immediately  if symptoms less severe.  You Must read complete instructions/literature along with all the possible adverse reactions/side effects for all the Medicines you take and that have been prescribed to you. Take any new Medicines after you have completely understood and accpet all the possible adverse reactions/side effects.   Do not drive, operating heavy machinery, perform activities at heights, swimming or participation in water activities or provide baby sitting services if your were admitted for syncope or siezures until you have seen by Primary MD or a Neurologist and advised to do so again.  Do not drive when taking Pain medications.    Do not take more than prescribed Pain,  Sleep and Anxiety Medications  Special Instructions: If you have smoked or chewed Tobacco  in the last 2 yrs please stop smoking, stop any regular Alcohol  and or any Recreational drug use.  Wear Seat belts while driving.   Please note  You were cared for by a hospitalist during your hospital stay. If you have any questions about your discharge medications or the care you received while you were in the hospital after you are discharged, you can call the unit and asked to speak with the hospitalist on call if the hospitalist that took care of you is not available. Once you are discharged, your primary care physician will handle any further medical issues. Please note that NO REFILLS for any discharge medications will be authorized once you are discharged, as it is imperative that you return to your primary care physician (or establish a relationship with a primary care physician if you do not have one) for your aftercare needs so that they can reassess your need for medications and monitor your lab values.     Increase activity slowly    Complete by:  As directed             Medication List    STOP taking these medications        digoxin 0.25 MG tablet  Commonly known as:  LANOXIN      TAKE these medications        amLODipine 5 MG tablet  Commonly known as:  NORVASC  Take 1 tablet (5 mg total) by mouth daily.     aspirin 325 MG tablet  Take 1 tablet (325 mg total) by mouth daily.     atorvastatin 40 MG tablet  Commonly known as:  LIPITOR  Take 1 tablet (40 mg total) by mouth daily at 6 PM.     cyclobenzaprine 10 MG tablet  Commonly known as:  FLEXERIL  Take 10 mg by mouth 3 (three) times daily as needed for muscle spasms.     dexamethasone 2 MG tablet  Commonly known as:  DECADRON  Take 1 tablet (2 mg total) by mouth daily.  folic acid 1 MG tablet  Commonly known as:  FOLVITE  Take 1 tablet (1 mg total) by mouth daily.     hydrALAZINE 100 MG tablet  Commonly known  as:  APRESOLINE  Take 0.5 tablets (50 mg total) by mouth 3 (three) times daily.     isosorbide mononitrate 30 MG 24 hr tablet  Commonly known as:  IMDUR  Take 1 tablet (30 mg total) by mouth daily.     nicotine 14 mg/24hr patch  Commonly known as:  NICODERM CQ - dosed in mg/24 hours  Place 1 patch (14 mg total) onto the skin daily.     potassium chloride 10 MEQ tablet  Commonly known as:  K-DUR  Take 1 tablet (10 mEq total) by mouth daily.     thiamine 100 MG tablet  Take 1 tablet (100 mg total) by mouth daily.          Diet and Activity recommendation: See Discharge Instructions above   Consults obtained -  Cardiology   Major procedures and Radiology Reports - PLEASE review detailed and final reports for all details, in brief -      Dg Chest 2 View  05/15/2015  CLINICAL DATA:  59 year old female with a history of stroke, weakness EXAM: CHEST - 2 VIEW COMPARISON:  08/12/2014 FINDINGS: Cardiomediastinal silhouette unchanged. Atherosclerotic calcifications of the aortic arch. Stigmata of emphysema, with increased retrosternal airspace, flattened hemidiaphragms, increased AP diameter, and hyperinflation on the AP view. Similar appearance of interstitial opacities with no confluent airspace disease. No pleural effusion or pneumothorax. No interlobular septal thickening. Degenerative changes of the spine. Unremarkable appearance of the upper abdomen. IMPRESSION: Emphysema and chronic lung scarring with no definite evidence of superimposed acute cardiopulmonary disease. Signed, Yvone Neu. Loreta Ave, DO [facility] Radiology Electronically Signed   By: Gilmer Mor D.O.   On: 05/15/2015 06:59   Ct Head Wo Contrast  05/18/2015  CLINICAL DATA:  Headache and weakness. EXAM: CT HEAD WITHOUT CONTRAST TECHNIQUE: Contiguous axial images were obtained from the base of the skull through the vertex without intravenous contrast. COMPARISON:  Head CT  4 days ago.  Brain MRI from 3 days ago FINDINGS: Skull and Sinuses:Negative for fracture or destructive process. The mastoids, middle ears, and imaged paranasal sinuses are clear. Orbits: No acute abnormality. Brain: Subacute infarct in the left ACA territory, from the left genu of the corpus callosum to the surface of the parasagittal left frontal lobe. No hemorrhagic conversion or evidence of extension. Pre-existing perforator infarct in the left caudate head and putamen and previous small infarct in the parasagittal left occipital lobe. No new infarct detected. No hydrocephalus. No shift. IMPRESSION: 1. No acute finding. 2. Subacute left ACA territory infarcts without evidence of progression or hemorrhagic conversion. 3. Remote left basal ganglia and occipital cortex infarcts. Electronically Signed   By: Marnee Spring M.D.   On: 05/18/2015 01:05   Ct Head Wo Contrast  05/14/2015  CLINICAL DATA:  2-5 day history of headache, blurry vision, slurred speech and right hand and leg pain. EXAM: CT HEAD WITHOUT CONTRAST TECHNIQUE: Contiguous axial images were obtained from the base of the skull through the vertex without intravenous contrast. COMPARISON:  05/24/2014. FINDINGS: There is an area of low attenuation in the central aspect of the left frontal lobe in the ACA territory consistent with a acute or subacute cortical infarct. No hemorrhage. No hemispheric infarction. Remote lacunar type infarct noted in the left caudate area. The brainstem  and cerebellum are normal. The bony structures are intact. The paranasal sinuses and mastoid air cells are clear. IMPRESSION: 1. Acute or subacute left ACA territory infarct.  No hemorrhage. 2. Remote lacunar type infarct in the left caudate area. These results were called by telephone at the time of interpretation on 05/14/2015 at 11:11 pm to Dr. Azalia Bilis , who verbally acknowledged these results. Electronically Signed   By: Rudie Meyer M.D.   On: 05/14/2015 23:11    Mr Brain Wo Contrast  05/18/2015  CLINICAL DATA:  Initial evaluation for increased right-sided weakness and dizziness with blurry vision. EXAM: MRI HEAD WITHOUT CONTRAST TECHNIQUE: Multiplanar, multiecho pulse sequences of the brain and surrounding structures were obtained without intravenous contrast. COMPARISON:  Prior CT from earlier same day as well as previous MRI from 05/15/2015. FINDINGS: Again seen is patchy and confluent restricted diffusion involving the left ACA territory, consistent with acute/subacute ischemic infarct. Overall, distribution and size of this infarct is not significantly changed relative to recent MRI. Again, probable faint petechial hemorrhage without evidence for hemorrhagic transformation. No significant mass effect. No new areas of infarction. Major intracranial vascular flow voids are maintained. Previously identified left A2 and PCA occlusions not well seen on this exam. Age-related cerebral atrophy with mild chronic small vessel ischemic disease noted, stable. Remote lacunar infarct within the left basal ganglia/ corona radiata. Small remote lacunar infarct within the left thalamus. Additional remote left occipital lobe infarct. No mass lesion, midline shift, or mass effect. No hydrocephalus. No extra-axial fluid collection. Craniocervical junction normal. Mild degenerative spondylolysis within the visualized upper cervical spine. Pituitary gland normal.  No acute abnormality about the orbits. Paranasal sinuses and mastoid air cells are clear. Inner ear structures normal. Bone marrow signal intensity within normal limits. Scalp soft tissues unremarkable. IMPRESSION: 1. Normal expected interval evolution of acute/subacute left ACA territory infarct. No hemorrhagic transformation or other complication identified. 2. No other new or acute intracranial process. 3. Stable atrophy with chronic small vessel ischemic disease and remote infarcts as above. Electronically Signed   By:  Rise Mu M.D.   On: 05/18/2015 02:58   Mr Brain Wo Contrast  05/15/2015  CLINICAL DATA:  Initial evaluation for right lower extremity weakness. EXAM: MRI HEAD WITHOUT CONTRAST MRA HEAD WITHOUT CONTRAST TECHNIQUE: Multiplanar, multiecho pulse sequences of the brain and surrounding structures were obtained without intravenous contrast. Angiographic images of the head were obtained using MRA technique without contrast. COMPARISON:  Prior CT from 05/14/2015. FINDINGS: MRI HEAD FINDINGS Patchy and confluent areas of restricted diffusion seen throughout the parasagittal left frontal lobe extending posteriorly towards the left parietal lobe, consistent with acute left ACA territory infarct. Largest confluent area of restricted diffusion measures 3.2 x 2.0 cm. There is involvement of the anterior left corpus callosum. Associated gyral swelling without significant mass effect. Faint petechial hemorrhage without hemorrhagic transformation. No right cerebral or infratentorial infarcts. Major intravascular flow voids are maintained. No acute or chronic intracranial hemorrhage. Age-related cerebral atrophy with mild chronic microvascular ischemic disease. Remote lacunar infarct within the left basal ganglia/corona radiata. Probable small additional remote lacunar infarct within the left thalamus. Remote cortical infarct within the parasagittal left occipital lobe. No mass lesion, midline shift, or mass effect. No hydrocephalus. No extra-axial fluid collection. Craniocervical junction within normal limits. Mild degenerative spondylolysis within the upper cervical spine. Pituitary gland normal.  No acute abnormality about the orbits. Paranasal sinuses are clear. No mastoid effusion. Inner ear structures within normal limits. Bone marrow signal intensity  normal. No scalp soft tissue abnormality. MRA HEAD FINDINGS ANTERIOR CIRCULATION: Visualized distal cervical segments of the internal carotid arteries are patent  with antegrade flow. The petrous, cavernous, and supraclinoid segments are patent without high-grade stenosis. Left A1 segment patent. Right A1 segment hypoplastic. Anterior communicating artery normal. Atheromatous irregularity within the right A2 segment. There is occlusion of the left A2 segment (series 6, image 97). M1 segments opacified without proximal arterial branch occlusion. There is focal moderate stenosis within the distal left M1 segment (series 802, image 14). Distal MCA branches are opacified bilaterally. POSTERIOR CIRCULATION: Left vertebral artery dominant and patent to the vertebrobasilar junction. Diminutive right vertebral artery patent as well. Left posterior inferior cerebral artery patent. Right PICA not visualized. Basilar artery widely patent. Left SCA opacified. Right SCA is faintly visualized on time-of-flight. Right P1 segment widely patent. There is a focal moderate to severe stenosis within the mid right P2 segment (series 803, image 18). Left P1 segment patent. There is apparent occlusion of the proximal left P2 segment. Patent left posterior communicating artery noted. No aneurysm. IMPRESSION: MRI HEAD IMPRESSION: 1. Acute left ACA territory infarct. There is faint petechial hemorrhage without hemorrhagic transformation or significant mass effect. 2. Remote left occipital cortical infarct, with additional remote lacunar infarcts involving the left basal ganglia/corona radiata and left thalamus. 3. Mild chronic small vessel ischemic disease. MRA HEAD IMPRESSION: 1. Occlusion of the proximal left A2 segment. 2. Occlusion of the proximal left PCA, likely chronic given the chronic left occipital infarct. 3. Focal moderate to severe stenosis within the mid right P2 segment. 4. Focal moderate stenosis within the distal left M1 segment. 5. Multifocal atheromatous irregularity within the right ACA. Electronically Signed   By: Rise Mu M.D.   On: 05/15/2015 04:58   Nm Myocar  Multi W/spect W/wall Motion / Ef  05/19/2015   There was no ST segment deviation noted during stress.  Defect 1: There is a large defect of mild severity.  Findings consistent with ischemia.  This is an intermediate risk study.  Nuclear stress EF: 61%.  The left ventricular ejection fraction is normal (55-65%).  Intermediate risk study with extensive, but very mild ischemia in the anterior and anterolateral distribution, with sparing of the apex and septum. This suggests ischemia due to stenosis in the territory of a large diagonal or ramus intermedius artery. "Shifting breast" attenuation may produce a similar appearance, but appears less likely based on raw data images. Normal LV regional and global systolic function.   Mr Palma Holter  05/15/2015  CLINICAL DATA:  Initial evaluation for right lower extremity weakness. EXAM: MRI HEAD WITHOUT CONTRAST MRA HEAD WITHOUT CONTRAST TECHNIQUE: Multiplanar, multiecho pulse sequences of the brain and surrounding structures were obtained without intravenous contrast. Angiographic images of the head were obtained using MRA technique without contrast. COMPARISON:  Prior CT from 05/14/2015. FINDINGS: MRI HEAD FINDINGS Patchy and confluent areas of restricted diffusion seen throughout the parasagittal left frontal lobe extending posteriorly towards the left parietal lobe, consistent with acute left ACA territory infarct. Largest confluent area of restricted diffusion measures 3.2 x 2.0 cm. There is involvement of the anterior left corpus callosum. Associated gyral swelling without significant mass effect. Faint petechial hemorrhage without hemorrhagic transformation. No right cerebral or infratentorial infarcts. Major intravascular flow voids are maintained. No acute or chronic intracranial hemorrhage. Age-related cerebral atrophy with mild chronic microvascular ischemic disease. Remote lacunar infarct within the left basal ganglia/corona radiata. Probable small  additional remote lacunar infarct within the  left thalamus. Remote cortical infarct within the parasagittal left occipital lobe. No mass lesion, midline shift, or mass effect. No hydrocephalus. No extra-axial fluid collection. Craniocervical junction within normal limits. Mild degenerative spondylolysis within the upper cervical spine. Pituitary gland normal.  No acute abnormality about the orbits. Paranasal sinuses are clear. No mastoid effusion. Inner ear structures within normal limits. Bone marrow signal intensity normal. No scalp soft tissue abnormality. MRA HEAD FINDINGS ANTERIOR CIRCULATION: Visualized distal cervical segments of the internal carotid arteries are patent with antegrade flow. The petrous, cavernous, and supraclinoid segments are patent without high-grade stenosis. Left A1 segment patent. Right A1 segment hypoplastic. Anterior communicating artery normal. Atheromatous irregularity within the right A2 segment. There is occlusion of the left A2 segment (series 6, image 97). M1 segments opacified without proximal arterial branch occlusion. There is focal moderate stenosis within the distal left M1 segment (series 802, image 14). Distal MCA branches are opacified bilaterally. POSTERIOR CIRCULATION: Left vertebral artery dominant and patent to the vertebrobasilar junction. Diminutive right vertebral artery patent as well. Left posterior inferior cerebral artery patent. Right PICA not visualized. Basilar artery widely patent. Left SCA opacified. Right SCA is faintly visualized on time-of-flight. Right P1 segment widely patent. There is a focal moderate to severe stenosis within the mid right P2 segment (series 803, image 18). Left P1 segment patent. There is apparent occlusion of the proximal left P2 segment. Patent left posterior communicating artery noted. No aneurysm. IMPRESSION: MRI HEAD IMPRESSION: 1. Acute left ACA territory infarct. There is faint petechial hemorrhage without hemorrhagic  transformation or significant mass effect. 2. Remote left occipital cortical infarct, with additional remote lacunar infarcts involving the left basal ganglia/corona radiata and left thalamus. 3. Mild chronic small vessel ischemic disease. MRA HEAD IMPRESSION: 1. Occlusion of the proximal left A2 segment. 2. Occlusion of the proximal left PCA, likely chronic given the chronic left occipital infarct. 3. Focal moderate to severe stenosis within the mid right P2 segment. 4. Focal moderate stenosis within the distal left M1 segment. 5. Multifocal atheromatous irregularity within the right ACA. Electronically Signed   By: Rise MuBenjamin  McClintock M.D.   On: 05/15/2015 04:58    Micro Results     Recent Results (from the past 240 hour(s))  Culture, blood (routine x 2)     Status: None (Preliminary result)   Collection Time: 05/15/15 11:52 AM  Result Value Ref Range Status   Specimen Description BLOOD LEFT HAND  Final   Special Requests BOTTLES DRAWN AEROBIC AND ANAEROBIC 5CC  Final   Culture NO GROWTH 4 DAYS  Final   Report Status PENDING  Incomplete  Culture, blood (routine x 2)     Status: None (Preliminary result)   Collection Time: 05/15/15 12:00 PM  Result Value Ref Range Status   Specimen Description BLOOD RIGHT HAND  Final   Special Requests BOTTLES DRAWN AEROBIC AND ANAEROBIC 5CC  Final   Culture NO GROWTH 4 DAYS  Final   Report Status PENDING  Incomplete       Today   Subjective:   [name] today has no headache,no chest abdominal pain,no new weakness tingling or numbness, feels much better  today.   Objective:   Blood pressure 123/66, pulse 81, temperature 98.5 F (36.9 C), temperature source Oral, resp. rate 18, height 5\' 5"  (1.651 m), weight 61.644 kg (135 lb 14.4 oz), SpO2 97 %.   Intake/Output Summary (Last 24 hours) at 05/20/15 1026 Last data filed at 05/19/15 1900  Gross per 24  hour  Intake    240 ml  Output      0 ml  Net    240 ml    Exam Awake Alert,  Oriented X 3, [place].AT,PERRAL Supple Neck,No JVD, No cervical lymphadenopathy appriciated.  Symmetrical Chest wall movement, Good air movement bilaterally, with usable chest pain on palpation RRR,No Gallops,Rubs or new Murmurs, No Parasternal Heave +ve B.Sounds, Abd Soft, No tenderness, No organomegaly appriciated, No rebound - guarding or rigidity. No Cyanosis, Clubbing or edema, No new Rash or bruise  Data Review   CBC w Diff:  Lab Results  Component Value Date   WBC 14.2* 05/18/2015   HGB 16.2* 05/18/2015   HCT 45.0 05/18/2015   PLT 284 05/18/2015   LYMPHOPCT 18 05/18/2015   MONOPCT 9 05/18/2015   EOSPCT 0 05/18/2015   BASOPCT 0 05/18/2015    CMP:  Lab Results  Component Value Date   NA 139 05/20/2015   K 3.6 05/20/2015   CL 106 05/20/2015   CO2 23 05/20/2015   BUN 13 05/20/2015   CREATININE 0.86 05/20/2015   PROT 6.9 05/18/2015   ALBUMIN 3.8 05/18/2015   BILITOT 0.4 05/18/2015   ALKPHOS 73 05/18/2015   AST 25 05/18/2015   ALT 18 05/18/2015  .   Total Time in preparing paper work, data evaluation and todays exam - 35 minutes  Ledia Hanford M.D on 05/20/2015 at 10:26 AM  [facility]   Office  [phone]

## 2015-05-20 NOTE — Progress Notes (Signed)
    Subjective:  No further CP, no SOB. Dysphagia noted since CVA.  Objective:  Vital Signs in the last 24 hours: Temp:  [97.7 F (36.5 C)-98.5 F (36.9 C)] 98.5 F (36.9 C) (11/15 0410) Pulse Rate:  [74-108] 81 (11/15 0410) Resp:  [16-20] 18 (11/15 0410) BP: (123-170)/(66-79) 123/66 mmHg (11/15 0410) SpO2:  [96 %-100 %] 97 % (11/15 0410)  Intake/Output from previous day: 11/14 0701 - 11/15 0700 In: 240 [P.O.:240] Out: -    Physical Exam: General: Well developed, well nourished, in no acute distress. Head:  Normocephalic and atraumatic. Lungs: Clear to auscultation and percussion. Heart: Normal S1 and S2.  2/6 systolic murmur, no rubs or gallops.  Abdomen: soft, non-tender, positive bowel sounds. Extremities: No clubbing or cyanosis. No edema. Neurologic: Alert and oriented x 3.    Lab Results:  Recent Labs  05/18/15 0133  WBC 14.2*  HGB 16.2*  PLT 284    Recent Labs  05/19/15 0440 05/20/15 0234  NA 139 139  K 3.3* 3.6  CL 105 106  CO2 24 23  GLUCOSE 120* 140*  BUN 13 13  CREATININE 0.88 0.86    Recent Labs  05/18/15 0133 05/18/15 0449  TROPONINI 0.04* 0.11*   Hepatic Function Panel  Recent Labs  05/18/15 0133  PROT 6.9  ALBUMIN 3.8  AST 25  ALT 18  ALKPHOS 73  BILITOT 0.4   No results for input(s): CHOL in the last 72 hours. No results for input(s): PROTIME in the last 72 hours.  Imaging: Nm Myocar Multi W/spect W/wall Motion / Ef  05/19/2015   There was no ST segment deviation noted during stress.  Defect 1: There is a large defect of mild severity.  Findings consistent with ischemia.  This is an intermediate risk study.  Nuclear stress EF: 61%.  The left ventricular ejection fraction is normal (55-65%).  Intermediate risk study with extensive, but very mild ischemia in the anterior and anterolateral distribution, with sparing of the apex and septum. This suggests ischemia due to stenosis in the territory of a large diagonal or  ramus intermedius artery. "Shifting breast" attenuation may produce a similar appearance, but appears less likely based on raw data images. Normal LV regional and global systolic function.   Personally viewed.   Telemetry: NO VT, NSR Personally viewed.   EKG:  No changes  Cardiac Studies:  EF 65%. NUC stress was intermediate risk. Possible anterolateral wall ischemia. Normal EF  Assessment/Plan:  Principal Problem:   Chest pain Active Problems:   Tobacco abuse   Alcohol abuse   Cocaine abuse   Accelerated hypertension   Leukocytosis   Polycythemia   Elevated troponin I level   Cerebrovascular accident (CVA) due to thrombosis of cerebral artery (HCC)  Chest pain  - Imudr  - amlodipine  - cocaine cessation  - medical mgt - intermediate risk NUC  Cocaine use  - cessation  - discussed risk of death  - no beta blocker   HTN  - improved with current meds  ETOH  - encourage cessation  CVA  - just DC'd from hosp  - antiplatelet  - statin  - mild Trop elevation.   OK with DC   Donato SchultzSKAINS, Hussain Maimone, MD     Demetria Iwai 05/20/2015, 8:37 AM

## 2015-05-20 NOTE — Discharge Instructions (Signed)
Follow with Primary MD [name],[name], MD in 7 days   Get CBC, CMP, 2 view Chest X ray checked  by Primary MD next visit.    Activity: As tolerated with Full fall precautions use walker/cane & assistance as needed   Disposition Home    Diet: Heart Healthy  , with feeding assistance and aspiration precautions.  For Heart failure patients - Check your Weight same time everyday, if you gain over 2 pounds, or you develop in leg swelling, experience more shortness of breath or chest pain, call your Primary MD immediately. Follow Cardiac Low Salt Diet and 1.5 lit/day fluid restriction.   On your next visit with your primary care physician please Get Medicines reviewed and adjusted.   Please request your Prim.MD to go over all Hospital Tests and Procedure/Radiological results at the follow up, please get all Hospital records sent to your Prim MD by signing hospital release before you go home.   If you experience worsening of your admission symptoms, develop shortness of breath, life threatening emergency, suicidal or homicidal thoughts you must seek medical attention immediately by calling 911 or calling your MD immediately  if symptoms less severe.  You Must read complete instructions/literature along with all the possible adverse reactions/side effects for all the Medicines you take and that have been prescribed to you. Take any new Medicines after you have completely understood and accpet all the possible adverse reactions/side effects.   Do not drive, operating heavy machinery, perform activities at heights, swimming or participation in water activities or provide baby sitting services if your were admitted for syncope or siezures until you have seen by Primary MD or a Neurologist and advised to do so again.  Do not drive when taking Pain medications.    Do not take more than prescribed Pain, Sleep and Anxiety Medications  Special Instructions: If you have smoked or chewed Tobacco  in  the last 2 yrs please stop smoking, stop any regular Alcohol  and or any Recreational drug use.  Wear Seat belts while driving.   Please note  You were cared for by a hospitalist during your hospital stay. If you have any questions about your discharge medications or the care you received while you were in the hospital after you are discharged, you can call the unit and asked to speak with the hospitalist on call if the hospitalist that took care of you is not available. Once you are discharged, your primary care physician will handle any further medical issues. Please note that NO REFILLS for any discharge medications will be authorized once you are discharged, as it is imperative that you return to your primary care physician (or establish a relationship with a primary care physician if you do not have one) for your aftercare needs so that they can reassess your need for medications and monitor your lab values.

## 2015-05-20 NOTE — Progress Notes (Signed)
Discharge instructions given to patient and daughter, questions answered, prescriptions given.  Governor SpeckingKathryn Williams, RN

## 2015-06-18 ENCOUNTER — Encounter: Payer: Medicaid Other | Admitting: Cardiology

## 2015-06-18 DIAGNOSIS — R0989 Other specified symptoms and signs involving the circulatory and respiratory systems: Secondary | ICD-10-CM

## 2015-06-25 ENCOUNTER — Telehealth: Payer: Self-pay | Admitting: Cardiology

## 2015-06-25 NOTE — Telephone Encounter (Signed)
Called pt and left message informing pt that we needed to update FM and medical Hx and if she could call back with the information.

## 2015-07-10 ENCOUNTER — Encounter: Payer: Self-pay | Admitting: Cardiology

## 2015-07-10 ENCOUNTER — Ambulatory Visit (INDEPENDENT_AMBULATORY_CARE_PROVIDER_SITE_OTHER): Payer: Medicaid Other | Admitting: Cardiology

## 2015-07-10 VITALS — BP 160/82 | HR 70 | Ht 65.0 in | Wt 136.4 lb

## 2015-07-10 DIAGNOSIS — R9439 Abnormal result of other cardiovascular function study: Secondary | ICD-10-CM

## 2015-07-10 NOTE — Progress Notes (Signed)
07/10/2015 [name]   1955/10/25  [id number]  Primary Physician [name],[name], MD Primary Cardiologist: [name]   Reason for Visit/CC: [facility] F/u for CP/Abnormal NST  HPI:  60 y/o female presenting to clinic for post hospital f/u. She has a h/o polysubstance, HTN, ? MI in the past and tobacco abuse. She was recently admitted 11/9-11/12/16 with a stroke involving her right body/left acute/subacute ACA.  It was initially felt to be embolic and she underwent TEE which demonstrated no vegetations and no left atrial clot. Transthoracic echo demonstrated mild concentric LVH with normal LV function without significant valvular abnormalities. Of note, UDS was also positive for cocaine and blood alcohol level was less than 5. There was concern for endocarditis, however blood cultures were negative. She was seen by neurology. It was felt that she was not the best candidate for anticoagulation given her h/o alcohol and cocaine abuse/ high fall risk. Neurology recommended ASA, statin and outpatient loop recorder placement. This was discussed with Dr. [name] and the plan was to arrange. She was discharged to a SNF on 05/17/15, but presented back the following day with a complaint of chest pain that resolved with NTG. She denied dyspnea and diaphoresis. Initial Troponin was 0.04 and then next had increased to 0.11.Cardiology was consulted to see patient and she was referred for admission.She underwent a nuclear stress test which was intermediate risk, showing possible anterior lateral wall ischemia but normal ejection fraction. Her chest pain had some nontypical features, but she has some risk factors for coronary artery disease including cocaine use and hypertension. Given her recent CVA and cocaine use, plan was to pursue noninvasive workup and to treat medically.  Imdur and amlodipine were added, no beta blocker given her cocaine use. She was continued on aspirin and statin. She was monitored further  and had no recurrent CP. She was discharged back to Rehab in [place].   Per records in Care Everywhere, [facility] with new onset confusion, head drooping, and worsening R sided weakness starting on the day of admission. L ACA infarct on MRI. She was placed on Eliquis. She also reported chest discomfort and EKG showed anterior ST elevation. CMB x3 were negative. Chest discomfort resolved spontaneously 11/28 morning, and she remained hemodynamically stable. Cardiology was curbsided on 11/27 consulted and formally consulted on 11/28. Cardiology recommended no immediate intervention. It was felt that she should have LHC when bleeding risk is acceptable given recent stroke. She was started with apixaban on 11/28 for stroke prevention. Per [facility] "Considering that she would likely to be on triple therapy with LHC PCI, we would prefer her LHC to be at least 2 weeks after the onset of this most recent stroke for hemorraghic conversion risk". She was advised to follow up with our cardiology group ("We contacted [place] where patient was seen recently by cardiology for NSTEMI and positive stress test (she was seen by cardiology there on 05/17/2015). She was scheduled an [facility] with Robbie LisBrittainy Silver Parkey, PA, who works with Dr. [name], on 06/18/2015 at 1030am)." Apparently this was rescheduled for today.    [facility] discharge instructions included the following:  - Keep BP < 160 with PRN antihypertensives - Continue home atorvastatin 40 mg daily - Continue Lisinopril 2.5mg   - stopped ASA 81mg  on 11/28 given starting apixaban - Continue Coreg 6.25mg  BID   She presents today for post hospital f/u. She continues to have  speech impairment and right sided weakness. She denies any significant CP. No dyspnea. She has been receiving meds at rehab facility. VSS.     Current Outpatient Prescriptions  Medication Sig Dispense  Refill  . apixaban (ELIQUIS) 5 MG TABS tablet Take 5 mg by mouth 2 (two) times daily.    Marland Kitchen atorvastatin (LIPITOR) 40 MG tablet Take 1 tablet (40 mg total) by mouth daily at 6 PM. 30 tablet 0  . carvedilol (COREG) 6.25 MG tablet Take 1 tablet by mouth 2 (two) times daily.    Marland Kitchen FLUoxetine (PROZAC) 20 MG capsule Take 20 mg by mouth daily.    . folic acid (FOLVITE) 1 MG tablet Take 1 tablet (1 mg total) by mouth daily. 30 tablet 0  . gabapentin (NEURONTIN) 100 MG capsule Take 100 mg by mouth 3 (three) times daily.    Marland Kitchen lisinopril (PRINIVIL,ZESTRIL) 2.5 MG tablet Take 1 tablet by mouth daily.    . Melatonin 3 MG TABS Take 1 tablet by mouth at bedtime.    . thiamine 100 MG tablet Take 1 tablet (100 mg total) by mouth daily. 30 tablet 0   No current facility-administered medications for this visit.    Allergies  Allergen Reactions  . Penicillins Hives and Itching    Social History   Social History  . Marital Status: Widowed    Spouse Name: N/A  . Number of Children: N/A  . Years of Education: N/A   Occupational History  . Not on file.   Social History Main Topics  . Smoking status: Current Some Day Smoker  . Smokeless tobacco: Never Used  . Alcohol Use: Yes     Comment: occ beer  . Drug Use: Yes    Special: Cocaine, Marijuana  . Sexual Activity: Not Currently    Birth Control/ Protection: Post-menopausal     Comment: Smokes sometimes 3 a day sometimes non   Other Topics Concern  . Not on file   Social History Narrative     Review of Systems: General: negative for chills, fever, night sweats or weight changes.  Cardiovascular: negative for chest pain, dyspnea on exertion, edema, orthopnea, palpitations, paroxysmal nocturnal dyspnea or shortness of breath Dermatological: negative for rash Respiratory: negative for cough or wheezing Urologic: negative for hematuria Abdominal: negative for nausea, vomiting, diarrhea, bright red blood per rectum, melena, or  hematemesis Neurologic: negative for visual changes, syncope, or dizziness All other systems reviewed and are otherwise negative except as noted above.    Blood pressure 160/82, pulse 70, height 5\' 5"  (1.651 m), weight 136 lb 6.4 oz (61.871 kg).  General appearance: alert, cooperative and no distress Neck: no carotid bruit and no JVD Lungs: clear to auscultation bilaterally Heart: regular rate and rhythm and 2/6 SM at LUSB Extremities: no LEE Pulses: 2+ and symmetric Skin: warm and dry Neurologic: right sided weakness, + dysarthria    ASSESSMENT AND PLAN:   1. Abnormal Stress Test: Presumed CAD based on stress test and w/u at [facility].  Per [facility], "she should have a LHC when bleeding risk is acceptable given recent stroke. Considering that she would likely to be on triple therapy with LHC PCI, we would prefer her LHC to be at least 2 weeks after the onset of this most recent stroke for hemorraghic conversion risk". [facility] advised her to f/u with [facility] for this. I have arranged for her to f/u with Dr. Anne Fu on 07/24/15 to further discussed. I aksed that she try to  bring a family member along, if possible. She has a son and a daughter. It seems that she has been doing ok w/o any recurrent pain since her admission at [facility]. For now, we will continue medical therapy with statin, BB, and ACE-I.   2. CVA: Currently on Eliquis, started at [facility]. ASA discontinued at [facility]. She remains on a statin. In rehab.    3. Cocaine Abuse: no recent use. Currently residing at a SNF.   PLAN  Continue medical therapy for presumed CAD and Eliquis for CVA. F/u arranged with Dr. Anne Fu 07/24/15 to discuss possibility of cath.   Robbie Lis PA-C 07/10/2015 4:30 PM

## 2015-07-10 NOTE — Patient Instructions (Addendum)
Medication Instructions:  Your physician recommends that you continue on your current medications as directed. Please refer to the Current Medication list given to you today.  Lab work: NONE  Testing/Procedures: NONE  Follow-Up: Your physician wants you to follow-up on 07/24/15 at 2:30 pm to see Dr. [name]. Please have your transportation pick you up at least two hours in advance to arrive on time for your appointment.  If you need a refill on your cardiac medications before your next appointment, please call your pharmacy.

## 2015-07-22 ENCOUNTER — Other Ambulatory Visit: Payer: Self-pay | Admitting: *Deleted

## 2015-07-24 ENCOUNTER — Ambulatory Visit: Payer: Medicaid Other | Admitting: Cardiology

## 2015-07-30 ENCOUNTER — Encounter: Payer: Self-pay | Admitting: Cardiology
